# Patient Record
Sex: Female | Born: 1985 | Race: White | Hispanic: No | Smoking: Current every day smoker
Health system: Southern US, Community
[De-identification: ages and names within clinical notes are randomized; demographics above are authoritative.]

## PROBLEM LIST (undated history)

## (undated) DIAGNOSIS — F4310 Post-traumatic stress disorder, unspecified: Secondary | ICD-10-CM

## (undated) DIAGNOSIS — T8859XA Other complications of anesthesia, initial encounter: Secondary | ICD-10-CM

## (undated) DIAGNOSIS — I10 Essential (primary) hypertension: Secondary | ICD-10-CM

## (undated) DIAGNOSIS — B009 Herpesviral infection, unspecified: Secondary | ICD-10-CM

## (undated) DIAGNOSIS — N920 Excessive and frequent menstruation with regular cycle: Secondary | ICD-10-CM

## (undated) DIAGNOSIS — F259 Schizoaffective disorder, unspecified: Secondary | ICD-10-CM

## (undated) DIAGNOSIS — T4145XA Adverse effect of unspecified anesthetic, initial encounter: Secondary | ICD-10-CM

## (undated) DIAGNOSIS — F319 Bipolar disorder, unspecified: Secondary | ICD-10-CM

## (undated) DIAGNOSIS — R87629 Unspecified abnormal cytological findings in specimens from vagina: Secondary | ICD-10-CM

## (undated) DIAGNOSIS — J45909 Unspecified asthma, uncomplicated: Secondary | ICD-10-CM

## (undated) DIAGNOSIS — F419 Anxiety disorder, unspecified: Secondary | ICD-10-CM

## (undated) DIAGNOSIS — G43909 Migraine, unspecified, not intractable, without status migrainosus: Secondary | ICD-10-CM

## (undated) DIAGNOSIS — J449 Chronic obstructive pulmonary disease, unspecified: Secondary | ICD-10-CM

## (undated) DIAGNOSIS — F32A Depression, unspecified: Secondary | ICD-10-CM

## (undated) DIAGNOSIS — F250 Schizoaffective disorder, bipolar type: Secondary | ICD-10-CM

## (undated) DIAGNOSIS — K219 Gastro-esophageal reflux disease without esophagitis: Secondary | ICD-10-CM

## (undated) HISTORY — DX: Migraine, unspecified, not intractable, without status migrainosus: G43.909

## (undated) HISTORY — PX: CARPAL TUNNEL RELEASE: SHX101

## (undated) HISTORY — PX: OTHER SURGICAL HISTORY: SHX169

## (undated) HISTORY — PX: LAPAROSCOPIC GASTRIC SLEEVE RESECTION: SHX5895

## (undated) HISTORY — DX: Herpesviral infection, unspecified: B00.9

## (undated) HISTORY — PX: PANNICULECTOMY: SUR1001

## (undated) HISTORY — DX: Unspecified abnormal cytological findings in specimens from vagina: R87.629

## (undated) HISTORY — DX: Excessive and frequent menstruation with regular cycle: N92.0

## (undated) HISTORY — DX: Unspecified asthma, uncomplicated: J45.909

## (undated) HISTORY — DX: Chronic obstructive pulmonary disease, unspecified: J44.9

## (undated) HISTORY — DX: Post-traumatic stress disorder, unspecified: F43.10

## (undated) HISTORY — PX: ABDOMINAL SURGERY: SHX537

## (undated) HISTORY — PX: TONSILLECTOMY: SUR1361

---
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
## 2007-04-04 DIAGNOSIS — G43909 Migraine, unspecified, not intractable, without status migrainosus: Secondary | ICD-10-CM | POA: Insufficient documentation

## 2007-04-18 DIAGNOSIS — F39 Unspecified mood [affective] disorder: Secondary | ICD-10-CM | POA: Insufficient documentation

## 2011-06-08 ENCOUNTER — Emergency Department: Payer: Self-pay | Admitting: Emergency Medicine

## 2011-06-23 ENCOUNTER — Emergency Department: Payer: Self-pay | Admitting: Emergency Medicine

## 2011-06-23 LAB — CBC
HGB: 12.5 g/dL (ref 12.0–16.0)
MCH: 29.5 pg (ref 26.0–34.0)
MCHC: 33.7 g/dL (ref 32.0–36.0)
MCV: 88 fL (ref 80–100)
Platelet: 335 10*3/uL (ref 150–440)
RBC: 4.24 10*6/uL (ref 3.80–5.20)

## 2011-06-23 LAB — URINALYSIS, COMPLETE
Bacteria: NONE SEEN
Bilirubin,UR: NEGATIVE
Glucose,UR: NEGATIVE mg/dL (ref 0–75)
Ketone: NEGATIVE
Leukocyte Esterase: NEGATIVE
Ph: 6 (ref 4.5–8.0)
Specific Gravity: 1.025 (ref 1.003–1.030)
Squamous Epithelial: NONE SEEN
WBC UR: 8 /HPF (ref 0–5)

## 2011-06-23 LAB — PREGNANCY, URINE: Pregnancy Test, Urine: NEGATIVE m[IU]/mL

## 2011-09-07 ENCOUNTER — Emergency Department: Payer: Self-pay | Admitting: Emergency Medicine

## 2011-09-19 ENCOUNTER — Emergency Department: Payer: Self-pay | Admitting: Emergency Medicine

## 2011-11-08 ENCOUNTER — Emergency Department: Payer: Self-pay | Admitting: *Deleted

## 2011-11-10 ENCOUNTER — Emergency Department: Payer: Self-pay | Admitting: Emergency Medicine

## 2011-11-12 ENCOUNTER — Emergency Department: Payer: Self-pay | Admitting: Emergency Medicine

## 2012-04-17 ENCOUNTER — Emergency Department: Payer: Self-pay | Admitting: Emergency Medicine

## 2012-09-14 ENCOUNTER — Ambulatory Visit: Payer: Self-pay | Admitting: Specialist

## 2012-09-24 ENCOUNTER — Ambulatory Visit: Payer: Self-pay | Admitting: Specialist

## 2014-08-28 LAB — HM PAP SMEAR: HM Pap smear: NEGATIVE

## 2014-12-14 ENCOUNTER — Emergency Department
Admission: EM | Admit: 2014-12-14 | Discharge: 2014-12-14 | Disposition: A | Discharge status: Home / Self Care | Payer: No Typology Code available for payment source | Attending: Emergency Medicine | Admitting: Emergency Medicine

## 2014-12-14 ENCOUNTER — Encounter: Payer: Self-pay | Admitting: *Deleted

## 2014-12-14 DIAGNOSIS — Z720 Tobacco use: Secondary | ICD-10-CM | POA: Insufficient documentation

## 2014-12-14 DIAGNOSIS — Z91040 Latex allergy status: Secondary | ICD-10-CM | POA: Insufficient documentation

## 2014-12-14 DIAGNOSIS — M545 Low back pain, unspecified: Secondary | ICD-10-CM

## 2014-12-14 DIAGNOSIS — Z79899 Other long term (current) drug therapy: Secondary | ICD-10-CM | POA: Diagnosis not present

## 2014-12-14 DIAGNOSIS — I10 Essential (primary) hypertension: Secondary | ICD-10-CM | POA: Insufficient documentation

## 2014-12-14 DIAGNOSIS — Y9389 Activity, other specified: Secondary | ICD-10-CM | POA: Insufficient documentation

## 2014-12-14 DIAGNOSIS — Y998 Other external cause status: Secondary | ICD-10-CM | POA: Diagnosis not present

## 2014-12-14 DIAGNOSIS — Z8659 Personal history of other mental and behavioral disorders: Secondary | ICD-10-CM | POA: Diagnosis not present

## 2014-12-14 DIAGNOSIS — Z880 Allergy status to penicillin: Secondary | ICD-10-CM | POA: Diagnosis not present

## 2014-12-14 DIAGNOSIS — S3992XA Unspecified injury of lower back, initial encounter: Secondary | ICD-10-CM | POA: Insufficient documentation

## 2014-12-14 DIAGNOSIS — Y9289 Other specified places as the place of occurrence of the external cause: Secondary | ICD-10-CM | POA: Insufficient documentation

## 2014-12-14 DIAGNOSIS — X58XXXA Exposure to other specified factors, initial encounter: Secondary | ICD-10-CM | POA: Diagnosis not present

## 2014-12-14 HISTORY — DX: Schizoaffective disorder, bipolar type: F25.0

## 2014-12-14 HISTORY — DX: Schizoaffective disorder, unspecified: F25.9

## 2014-12-14 HISTORY — DX: Essential (primary) hypertension: I10

## 2014-12-14 HISTORY — DX: Bipolar disorder, unspecified: F31.9

## 2014-12-14 HISTORY — DX: Anxiety disorder, unspecified: F41.9

## 2014-12-14 MED ORDER — OXYCODONE-ACETAMINOPHEN 5-325 MG PO TABS: 1.0000 | ORAL_TABLET | ORAL | Status: DC | PRN

## 2014-12-14 MED ORDER — BACLOFEN 10 MG PO TABS: 5.0000 mg | ORAL_TABLET | Freq: Three times a day (TID) | ORAL | Status: DC

## 2014-12-14 MED ORDER — OXYCODONE-ACETAMINOPHEN 5-325 MG PO TABS
ORAL_TABLET | ORAL | Status: AC
  Administered 2014-12-14: 2 via ORAL
  Filled 2014-12-14: qty 2

## 2014-12-14 MED ORDER — OXYCODONE-ACETAMINOPHEN 5-325 MG PO TABS
ORAL_TABLET | ORAL | Status: AC
  Administered 2014-12-14: 1 via ORAL
  Filled 2014-12-14: qty 1

## 2014-12-14 MED ORDER — OXYCODONE-ACETAMINOPHEN 5-325 MG PO TABS
2.0000 | ORAL_TABLET | Freq: Once | ORAL | Status: AC
  Administered 2014-12-14: 2 via ORAL

## 2014-12-14 MED ORDER — BACLOFEN 10 MG PO TABS
5.0000 mg | ORAL_TABLET | ORAL | Status: AC
Start: 2014-12-14 — End: 2014-12-14
  Administered 2014-12-14: 5 mg via ORAL
  Filled 2014-12-14: qty 0.5

## 2014-12-14 MED ORDER — OXYCODONE-ACETAMINOPHEN 5-325 MG PO TABS
1.0000 | ORAL_TABLET | Freq: Once | ORAL | Status: AC
  Administered 2014-12-14: 1 via ORAL

## 2014-12-14 NOTE — ED Notes (Signed)
Pt alert and in NAD at time of d/c to friend. 

## 2014-12-14 NOTE — ED Provider Notes (Signed)
[facility] Emergency Department Provider Note  ____________________________________________  Time seen: Approximately 4:22 AM  I have reviewed the triage vital signs and the nursing notes.   HISTORY  Chief Complaint Back Pain   HPI Michelle Kelley is a 29 y.o. female with a history of psychiatric illness and profound intentional weight loss status post gastric sleevepresents with acute onset of severe lower back pain starting yesterday.  She states that she felt a pop and has been having severe back pain and spasming on the right side of her lower middle back since that time.  She can put a finger right on the spot that hurts.  The pain radiates to her right buttock but does not radiate farther.  She has no numbness or loss of sensation and no weakness in her lower extremities.  She has had no difficulty urinating and no bladder or bowel incontinence.  She states that applying pressure directly to the affected region helps relieve the pain.     Past Medical History  Diagnosis Date  . Anxiety   . Hypertension   . Schizophrenia, schizo-affective   . Manic depression     There are no active problems to display for this patient.   Past Surgical History  Procedure Laterality Date  . Abdominal surgery    . Panniculectomy      Current Outpatient Rx  Name  Route  Sig  Dispense  Refill  . Multiple Vitamin (MULTIVITAMIN) tablet   Oral   Take 1 tablet by mouth daily.         . baclofen (LIORESAL) 10 MG tablet   Oral   Take 0.5 tablets (5 mg total) by mouth 3 (three) times daily.   30 each   0   . oxyCODONE-acetaminophen (ROXICET) 5-325 MG per tablet   Oral   Take 1-2 tablets by mouth every 4 (four) hours as needed for severe pain.   20 tablet   0     Allergies Amitriptyline; Latex; Nsaids; Penicillins; and Sulfa antibiotics  History reviewed. No pertinent family history.  Social History History  Substance Use Topics  . Smoking status:  Current Every Day Smoker    Types: Cigarettes  . Smokeless tobacco: Never Used  . Alcohol Use: No    Review of Systems Constitutional: No fever/chills Eyes: No visual changes. ENT: No sore throat. Cardiovascular: Denies chest pain. Respiratory: Denies shortness of breath. Gastrointestinal: No abdominal pain.  No nausea, no vomiting.  No diarrhea.  No constipation. Genitourinary: Negative for dysuria.  No urinary incontinence or retention Musculoskeletal: Negative for back pain. Skin: Negative for rash. Neurological: Negative for headaches, focal weakness or numbness.  10-point ROS otherwise negative.  ____________________________________________   PHYSICAL EXAM:  VITAL SIGNS: ED Triage Vitals  Enc Vitals Group     BP 12/14/14 0251 161/109 mmHg     Pulse Rate 12/14/14 0251 110     Resp 12/14/14 0251 18     Temp 12/14/14 0251 99.1 F (37.3 C)     Temp Source 12/14/14 0251 Oral     SpO2 12/14/14 0251 99 %     Weight 12/14/14 0251 130 lb (58.968 kg)     Height 12/14/14 0251 5\' 2"  (1.575 m)     Head Cir --      Peak Flow --      Pain Score 12/14/14 0254 8     Pain Loc --      Pain Edu? --  Excl. in GC? --     Constitutional: Alert and oriented. Well appearing and in no acute distress.  Appears uncomfortable Eyes: Conjunctivae are normal. PERRL. EOMI. Head: Atraumatic. Nose: No congestion/rhinnorhea. Mouth/Throat: Mucous membranes are moist.  Oropharynx non-erythematous. Neck: No stridor.   Cardiovascular: Normal rate, regular rhythm. Grossly normal heart sounds.  Good peripheral circulation. Respiratory: Normal respiratory effort.  No retractions. Lungs CTAB. Gastrointestinal: Soft and nontender. No distention. No abdominal bruits. No CVA tenderness. Musculoskeletal: Severe point tenderness and muscle spasms of the paraspinal muscles to the right side of her spinal column approximately at the area of T10.  No bony tenderness to palpation of the vertebral column.   Neurologic:  Normal speech and language. No gross focal neurologic deficits are appreciated. Speech is normal.  Lower extremity reflexes are normal bilaterally. Skin:  Skin is warm, dry and intact. No rash noted. Psychiatric: Mood and affect are normal. Speech and behavior are normal.  ____________________________________________   LABS (all labs ordered are listed, but only abnormal results are displayed)  Labs Reviewed - No data to display ____________________________________________  EKG  Not indicated ____________________________________________  RADIOLOGY  No results found.  ____________________________________________   PROCEDURES  Procedure(s) performed: None  Critical Care performed: No ____________________________________________   INITIAL IMPRESSION / ASSESSMENT AND PLAN / ED COURSE  Pertinent labs & imaging results that were available during my care of the patient were reviewed by me and considered in my medical decision making (see chart for details).  Acute lower thoracic/upper lumbar right-sided back pain.  No evidence of cauda equina.  I looked up the patient and drug database and she has no hits.  I will provide baclofen and a small number of Percocet and advised conservative measures.  I will provide follow-up with orthopedics and I gave her my usual customary return precautions.  ____________________________________________  FINAL CLINICAL IMPRESSION(S) / ED DIAGNOSES  Final diagnoses:  Acute low back pain      NEW MEDICATIONS STARTED DURING THIS VISIT:  New Prescriptions   BACLOFEN (LIORESAL) 10 MG TABLET    Take 0.5 tablets (5 mg total) by mouth 3 (three) times daily.   OXYCODONE-ACETAMINOPHEN (ROXICET) 5-325 MG PER TABLET    Take 1-2 tablets by mouth every 4 (four) hours as needed for severe pain.     Loleta Rose, MD 12/14/14 [phone]

## 2014-12-14 NOTE — Discharge Instructions (Signed)
You have been seen in the Emergency Department (ED)  today for back pain.  Your workup and exam have not shown any acute abnormalities and you are likely suffering from muscle strain or possible problems with your discs, but there is no treatment that will fix your symptoms at this time.    Take Percocet as prescribed. Do not drink alcohol, drive or participate in any other potentially dangerous activities while taking this medication as it may make you sleepy. Do not take this medication with any other sedating medications, either prescription or over-the-counter. If you were prescribed Percocet or Vicodin, do not take these with acetaminophen (Tylenol) as it is already contained within these medications.   This medication is an opiate (or narcotic) pain medication and can be habit forming.  Use it as little as possible to achieve adequate pain control.  Do not use or use it with extreme caution if you have a history of opiate abuse or dependence.  If you are on a pain contract with your primary care doctor or a pain specialist, be sure to let them know you were prescribed this medication today from the [facility] Emergency Department.  This medication is intended for your use only - do not give any to anyone else and keep it in a secure place where nobody else, especially children, have access to it.  It will also cause or worsen constipation, so you may want to consider taking an over-the-counter stool softener while you are taking this medication.  Please follow up with your doctor as soon as possible regarding today's ED visit and your back pain.  Return to the ED for worsening back pain, fever, weakness or numbness of either leg, or if you develop either (1) an inability to urinate or have bowel movements, or (2) loss of your ability to control your bathroom functions (if you start having "accidents"), or if you develop other new symptoms that concern you.   Back Pain, Adult Low back pain is  very common. About 1 in 5 people have back pain.The cause of low back pain is rarely dangerous. The pain often gets better over time.About half of people with a sudden onset of back pain feel better in just 2 weeks. About 8 in 10 people feel better by 6 weeks.  CAUSES Some common causes of back pain include:  Strain of the muscles or ligaments supporting the spine.  Wear and tear (degeneration) of the spinal discs.  Arthritis.  Direct injury to the back. DIAGNOSIS Most of the time, the direct cause of low back pain is not known.However, back pain can be treated effectively even when the exact cause of the pain is unknown.Answering your caregiver's questions about your overall health and symptoms is one of the most accurate ways to make sure the cause of your pain is not dangerous. If your caregiver needs more information, he or she may order lab work or imaging tests (X-rays or MRIs).However, even if imaging tests show changes in your back, this usually does not require surgery. HOME CARE INSTRUCTIONS For many people, back pain returns.Since low back pain is rarely dangerous, it is often a condition that people can learn to [facility] their own.   Remain active. It is stressful on the back to sit or stand in one place. Do not sit, drive, or stand in one place for more than 30 minutes at a time. Take short walks on level surfaces as soon as pain allows.Try to increase the length  of time you walk each day.  Do not stay in bed.Resting more than 1 or 2 days can delay your recovery.  Do not avoid exercise or work.Your body is made to move.It is not dangerous to be active, even though your back may hurt.Your back will likely heal faster if you return to being active before your pain is gone.  Pay attention to your body when you bend and lift. Many people have less discomfortwhen lifting if they bend their knees, keep the load close to their bodies,and avoid twisting. Often, the most  comfortable positions are those that put less stress on your recovering back.  Find a comfortable position to sleep. Use a firm mattress and lie on your side with your knees slightly bent. If you lie on your back, put a pillow under your knees.  Only take over-the-counter or prescription medicines as directed by your caregiver. Over-the-counter medicines to reduce pain and inflammation are often the most helpful.Your caregiver may prescribe muscle relaxant drugs.These medicines help dull your pain so you can more quickly return to your normal activities and healthy exercise.  Put ice on the injured area.  Put ice in a plastic bag.  Place a towel between your skin and the bag.  Leave the ice on for 15-20 minutes, 03-04 times a day for the first 2 to 3 days. After that, ice and heat may be alternated to reduce pain and spasms.  Ask your caregiver about trying back exercises and gentle massage. This may be of some benefit.  Avoid feeling anxious or stressed.Stress increases muscle tension and can worsen back pain.It is important to recognize when you are anxious or stressed and learn ways to manage it.Exercise is a great option. SEEK MEDICAL CARE IF:  You have pain that is not relieved with rest or medicine.  You have pain that does not improve in 1 week.  You have new symptoms.  You are generally not feeling well. SEEK IMMEDIATE MEDICAL CARE IF:   You have pain that radiates from your back into your legs.  You develop new bowel or bladder control problems.  You have unusual weakness or numbness in your arms or legs.  You develop nausea or vomiting.  You develop abdominal pain.  You feel faint. Document Released: 06/06/2005 Document Revised: 12/06/2011 Document Reviewed: 10/08/2013 [facility] Patient Information 2015 [place], [place]. This information is not intended to replace advice given to you by your health care provider. Make sure you discuss any questions you have with  your health care provider.

## 2014-12-14 NOTE — ED Notes (Signed)
Pt states she "popped" her back yesterday, injuring her back. Pt states she felt a "pop" and then her muscles became tight and started having back spasms at work. Pt states her knees felt weak and she was unable to stand. Pt states R side is more painful. Pt marked the points of tenderness with a sharpie. Pt states that as long as she puts pressure on her lower back she is able to tolerate the pain.

## 2015-04-15 LAB — OB RESULTS CONSOLE GC/CHLAMYDIA
CHLAMYDIA, DNA PROBE: NEGATIVE
GC PROBE AMP, GENITAL: NEGATIVE

## 2015-04-22 LAB — OB RESULTS CONSOLE PLATELET COUNT: Platelets: 283 10*3/uL

## 2015-04-22 LAB — OB RESULTS CONSOLE HGB/HCT, BLOOD
HEMATOCRIT: 37 %
Hemoglobin: 12.4 g/dL

## 2015-04-22 LAB — OB RESULTS CONSOLE HEPATITIS B SURFACE ANTIGEN: Hepatitis B Surface Ag: NEGATIVE

## 2015-04-22 LAB — OB RESULTS CONSOLE ABO/RH: RH Type: NEGATIVE

## 2015-04-22 LAB — OB RESULTS CONSOLE RUBELLA ANTIBODY, IGM: RUBELLA: IMMUNE

## 2015-04-22 LAB — OB RESULTS CONSOLE ANTIBODY SCREEN: Antibody Screen: NEGATIVE

## 2015-04-22 LAB — OB RESULTS CONSOLE RPR: RPR: NONREACTIVE

## 2015-04-22 LAB — OB RESULTS CONSOLE HIV ANTIBODY (ROUTINE TESTING): HIV: NONREACTIVE

## 2015-04-22 LAB — OB RESULTS CONSOLE VARICELLA ZOSTER ANTIBODY, IGG: Varicella: IMMUNE

## 2015-09-01 ENCOUNTER — Ambulatory Visit (INDEPENDENT_AMBULATORY_CARE_PROVIDER_SITE_OTHER): Payer: No Typology Code available for payment source | Admitting: Podiatry

## 2015-09-01 ENCOUNTER — Encounter: Payer: Self-pay | Admitting: Podiatry

## 2015-09-01 VITALS — BP 133/73 | HR 95 | Resp 18

## 2015-09-01 DIAGNOSIS — L600 Ingrowing nail: Secondary | ICD-10-CM | POA: Diagnosis not present

## 2015-09-01 NOTE — Progress Notes (Signed)
   Subjective:    Patient ID: Michelle Kelley, female    DOB: 04/17/1986, 30 y.o.   MRN: [id number]  HPI  30 year old female presents the office today for concerns of pain to the left medial big toe nail. She states his revisit have partial nail avulsion performed and since then she has started to have recurrence of the small Pease and nail within the nail and also concerned become ingrown again. She has not noticed any drainage or pus. Denies any surrounding redness or swelling. She's had no recent treatment. No other complaints.  She is going 7 months pregnant.    Review of Systems  All other systems reviewed and are negative.      Objective:   Physical Exam General: AAO x3, NAD  Dermatological: On the medial portion left hallux toenail does appear to be a small spicule of nail within the nail border there is evidence of a previous partial nail avulsion. There is thick hyperkeratotic tissue present in the proximal medial aspect of the nail fold. It is really some incurvation along the remnant of the nail the nail somewhat loose distally. His tenderness overlying the medial portion of the nail on the incurvated portion. There is no drainage or pus. No redness or swelling. No open lesions.  Vascular: Dorsalis Pedis artery and Posterior Tibial artery pedal pulses are 2/4 bilateral with immedate capillary fill time. Pedal hair growth present. No varicosities and no lower extremity edema present bilateral. There is no pain with calf compression, swelling, warmth, erythema.   Neruologic: Grossly intact via light touch bilateral. Vibratory intact via tuning fork bilateral. Protective threshold with Semmes Wienstein monofilament intact to all pedal sites bilateral. Patellar and Achilles deep tendon reflexes 2+ bilateral. No Babinski or clonus noted bilateral.   Musculoskeletal: No gross boney pedal deformities bilateral. No pain, crepitus, or limitation noted with foot and ankle range of motion  bilateral. Muscular strength 5/5 in all groups tested bilateral.  Gait: Unassisted, Nonantalgic.      Assessment & Plan:  30 year old female with left hallux recurrence ingrown toenail, no infection -Treatment options discussed including all alternatives, risks, and complications -Etiology of symptoms were discussed -At this time I discussed with her removal of the ingrown portion of the nail as well as to clean up the nail border from the previous nail avulsion. She did proceed with this. Because she is pregnant did not use female. Under sterile conditions a total of 3 mL of a lidocaine plain was infiltrated in a hallux block fashion. Once anesthetized the skin was prepped in sterile fashion. Next a curette was utilized to remove the spicule of nail in the medial nail border as well as a debridement of hyperkeratotic tissue within nail border. Also the medial aspect of the remaining nail was excised to remove the ingrown portion. There is no purulence expressed. No clinical signs of infection. The area was irrigated with saline and hemostasis was achieved. Betadine ointment was applied followed by a dressing. The patient had the procedure well any complications. Post procedure instructions were discussed. -Follow-up in 1 week or sooner if any problems arise. In the meantime, encouraged to call the office with any questions, concerns, change in symptoms.   Ovid CurdMatthew Zahmir Lalla, DPM

## 2015-09-01 NOTE — Patient Instructions (Signed)

## 2015-09-08 ENCOUNTER — Encounter: Payer: Self-pay | Admitting: Podiatry

## 2015-09-08 ENCOUNTER — Ambulatory Visit (INDEPENDENT_AMBULATORY_CARE_PROVIDER_SITE_OTHER): Payer: No Typology Code available for payment source | Admitting: Podiatry

## 2015-09-08 DIAGNOSIS — L600 Ingrowing nail: Secondary | ICD-10-CM

## 2015-09-08 DIAGNOSIS — Z9889 Other specified postprocedural states: Secondary | ICD-10-CM

## 2015-09-08 NOTE — Patient Instructions (Signed)

## 2015-09-08 NOTE — Progress Notes (Signed)
Patient ID: Michelle Kelley, female   DOB: 03/10/1986, [age] y.o.   MRN: [id number]  Subjective: Michelle Kelley is a 30 y.o.  female returns to office today for follow up evaluation after having left medial Hallux partial temporary nail avulsion performed. Patient has been soaking using epsom salts and applying topical antibiotic covered with bandaid daily. Denies any surrounding redness or drainage or pus. She states it feels better than before surgery and the discomfort she is having is from the procedure site itself. This is improving. Patient denies fevers, chills, nausea, vomiting. Denies any calf pain, chest pain, SOB.   Objective:  Vitals: Reviewed  General: Well developed, nourished, in no acute distress, alert and oriented x3   Dermatology: Skin is warm, dry and supple bilateral. Medial hallux nail border appears to be clean, dry, with mild granular tissue and surrounding scab. There is no surrounding erythema, edema, drainage/purulence. The remaining nails appear unremarkable at this time. There are no other lesions or other signs of infection present.  Neurovascular status: Intact. No lower extremity swelling; No pain with calf compression bilateral.  Musculoskeletal: Decreased tenderness to palpation of the medial hallux nail fold. Muscular strength within normal limits bilateral.   Assesement and Plan: S/p partial nail avulsion, doing well.   -Continue soaking in epsom salts twice a day followed by antibiotic ointment and a band-aid. Can leave uncovered at night. Continue this until completely healed.  -If the area has not healed in 2 weeks, call the office for follow-up appointment, or sooner if any problems arise.  -Monitor for any signs/symptoms of infection. Call the office immediately if any occur or go directly to the emergency room. Call with any questions/concerns.  Ovid CurdMatthew Wagoner, DPM

## 2015-10-06 ENCOUNTER — Ambulatory Visit (INDEPENDENT_AMBULATORY_CARE_PROVIDER_SITE_OTHER): Payer: No Typology Code available for payment source | Admitting: Obstetrics and Gynecology

## 2015-10-06 ENCOUNTER — Encounter: Payer: Self-pay | Admitting: Obstetrics and Gynecology

## 2015-10-06 VITALS — BP 121/83 | HR 94 | Wt 176.1 lb

## 2015-10-06 DIAGNOSIS — Z3493 Encounter for supervision of normal pregnancy, unspecified, third trimester: Secondary | ICD-10-CM

## 2015-10-06 DIAGNOSIS — F203 Undifferentiated schizophrenia: Secondary | ICD-10-CM

## 2015-10-06 DIAGNOSIS — E669 Obesity, unspecified: Secondary | ICD-10-CM

## 2015-10-06 DIAGNOSIS — Z720 Tobacco use: Secondary | ICD-10-CM

## 2015-10-06 DIAGNOSIS — Z98891 History of uterine scar from previous surgery: Secondary | ICD-10-CM

## 2015-10-06 DIAGNOSIS — O09293 Supervision of pregnancy with other poor reproductive or obstetric history, third trimester: Secondary | ICD-10-CM

## 2015-10-06 DIAGNOSIS — F3170 Bipolar disorder, currently in remission, most recent episode unspecified: Secondary | ICD-10-CM

## 2015-10-06 DIAGNOSIS — F1990 Other psychoactive substance use, unspecified, uncomplicated: Secondary | ICD-10-CM

## 2015-10-06 LAB — POCT URINALYSIS DIPSTICK
Bilirubin, UA: 1
GLUCOSE UA: NEGATIVE
Ketones, UA: NEGATIVE
NITRITE UA: NEGATIVE
RBC UA: NEGATIVE
Spec Grav, UA: 1.02
Urobilinogen, UA: 0.2
pH, UA: 6.5

## 2015-10-06 NOTE — Progress Notes (Signed)
NOB transfer from [facility] 32 weeks- no GTT- checked BS all normal- utd on tdap- RH- neg- had rhogam. Did not sign MRR.   OB TRANSFER: The patient is a 30 year old single engaged white female gravida 3 para 06/21/1998, EGA 32.[redacted] weeks gestation, [facility], [facility], presents for transfer of care. Past OB history: G1-severe preeclampsia; induction of labor and [redacted] weeks gestation [facility]; patient was told infant died, but believes otherwise based on information given to her by other family members and friends. G2-severe preeclampsia; delivered at [redacted] weeks gestation via primary low cervical transverse cesarean section; patient weighs 350 pounds at that delivery, and subsequently lost 220 pounds following vertical sleeve gastrectomy surgery and panniculectomy in 2014; baby was given up for adoption. G3-current pregnancy  Patient has had different father of baby for each pregnancy. Father of baby current pregnancy is supportive. No genetic testing done this pregnancy; family history is unknown as both mother and father were adopted. Denies alcohol use  Significant prenatal risk factors: 1. History of severe preeclampsia in first 2 pregnancies; unable to take aspirin therapy because of gastric sleeve surgery 2. History of prior cesarean section delivery; scheduled for repeat 3. History of schizophrenia; previously on Geodon; not currently on medication 4. History of manic depressive disorder; not on medications; does not desire psychiatry care;" just goes to hospital when admission is needed" 5. Tobacco user; tobacco day smoker with decrease in cigarette consumption to 5 cigarettes a day currently 6. 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 Increased BMI 8. 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 High-risk pregnancy at 32.[redacted] weeks gestation 2. Prior C-section, requiring repeat 3. History of severe preeclampsia with first 2 pregnancies without evidence of elevated blood pressures in current pregnancy 4. Tobacco user 5. Substance use history, currently using marijuana 6. 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 Begin antepartum testing with twice weekly NSTs 2. Growth ultrasound-1 week 3. [facility] and [facility] for repeat [facility] 4. Patient was counseled regarding tobacco use in pregnancy and is encouraged to quit 5. Patient was counseled regarding marijuana use in pregnancy, implications on neurologic fetal development antepartum as well as impact on development postpartum and breast-feeding women using marijuana. Patient is encouraged not to breast-feed.  Herold HarmsMartin A Defrancesco, MD

## 2015-10-07 ENCOUNTER — Telehealth: Payer: Self-pay | Admitting: *Deleted

## 2015-10-07 DIAGNOSIS — F1990 Other psychoactive substance use, unspecified, uncomplicated: Secondary | ICD-10-CM | POA: Insufficient documentation

## 2015-10-07 DIAGNOSIS — O09299 Supervision of pregnancy with other poor reproductive or obstetric history, unspecified trimester: Secondary | ICD-10-CM | POA: Insufficient documentation

## 2015-10-07 DIAGNOSIS — F319 Bipolar disorder, unspecified: Secondary | ICD-10-CM | POA: Insufficient documentation

## 2015-10-07 DIAGNOSIS — Z720 Tobacco use: Secondary | ICD-10-CM | POA: Insufficient documentation

## 2015-10-07 DIAGNOSIS — E669 Obesity, unspecified: Secondary | ICD-10-CM | POA: Insufficient documentation

## 2015-10-07 DIAGNOSIS — Z98891 History of uterine scar from previous surgery: Secondary | ICD-10-CM | POA: Insufficient documentation

## 2015-10-07 DIAGNOSIS — O99210 Obesity complicating pregnancy, unspecified trimester: Secondary | ICD-10-CM | POA: Insufficient documentation

## 2015-10-07 DIAGNOSIS — F209 Schizophrenia, unspecified: Secondary | ICD-10-CM | POA: Insufficient documentation

## 2015-10-07 NOTE — Telephone Encounter (Signed)
Patient called and stated that she received a call this morning from the office.  Patient stated that a message wasn't left for her. Patient is requesting a call back . Call back number [phone]. Thanks

## 2015-10-08 ENCOUNTER — Telehealth: Payer: Self-pay | Admitting: *Deleted

## 2015-10-08 NOTE — Telephone Encounter (Signed)
Pt is coming in 10/09/15

## 2015-10-08 NOTE — Telephone Encounter (Signed)
Pt aware I did not call her. Not sure who is? Pt states she has a migraine. Advised tylenol es. Stay hydrated. Cool compress.

## 2015-10-08 NOTE — Telephone Encounter (Signed)
Patient called again stating someone called her.  I made patient aware that it could be the reminder system reminding her of her appt.  Patient cell phone is not set up with Voicemail. Patient is requesting a call back. Patient states to call back after 12 noon. Thanks

## 2015-10-09 ENCOUNTER — Encounter: Payer: No Typology Code available for payment source | Admitting: Obstetrics and Gynecology

## 2015-10-09 ENCOUNTER — Other Ambulatory Visit: Payer: Self-pay | Admitting: Obstetrics and Gynecology

## 2015-10-09 ENCOUNTER — Ambulatory Visit (INDEPENDENT_AMBULATORY_CARE_PROVIDER_SITE_OTHER): Payer: No Typology Code available for payment source | Admitting: Obstetrics and Gynecology

## 2015-10-09 ENCOUNTER — Other Ambulatory Visit (INDEPENDENT_AMBULATORY_CARE_PROVIDER_SITE_OTHER): Payer: No Typology Code available for payment source

## 2015-10-09 VITALS — BP 125/61 | HR 102 | Wt 176.5 lb

## 2015-10-09 DIAGNOSIS — O365993 Maternal care for other known or suspected poor fetal growth, unspecified trimester, fetus 3: Secondary | ICD-10-CM

## 2015-10-09 DIAGNOSIS — Z369 Encounter for antenatal screening, unspecified: Secondary | ICD-10-CM

## 2015-10-09 DIAGNOSIS — Z3493 Encounter for supervision of normal pregnancy, unspecified, third trimester: Secondary | ICD-10-CM

## 2015-10-09 DIAGNOSIS — Z1389 Encounter for screening for other disorder: Secondary | ICD-10-CM

## 2015-10-09 DIAGNOSIS — F1910 Other psychoactive substance abuse, uncomplicated: Secondary | ICD-10-CM

## 2015-10-09 DIAGNOSIS — Z3490 Encounter for supervision of normal pregnancy, unspecified, unspecified trimester: Secondary | ICD-10-CM

## 2015-10-09 DIAGNOSIS — Z36 Encounter for antenatal screening of mother: Secondary | ICD-10-CM

## 2015-10-09 LAB — POCT URINALYSIS DIPSTICK
Bilirubin, UA: NEGATIVE
Glucose, UA: NEGATIVE
KETONES UA: NEGATIVE
Leukocytes, UA: NEGATIVE
Nitrite, UA: NEGATIVE
PH UA: 6
Protein, UA: NEGATIVE
RBC UA: NEGATIVE
Spec Grav, UA: 1.01
UROBILINOGEN UA: NEGATIVE

## 2015-10-09 NOTE — Progress Notes (Signed)
   NONSTRESS TEST INTERPRETATION  INDICATIONS: possible aymmetric IUGR; substance abuse, hx severe preeclampsia  FHR baseline:  150 RESULTS: reactive, reassuring, pt sent to ultrasound per [facility] COMMENTS: drug screen ordered per [facility]   PLAN: 1. Continue fetal kick counts twice a day. 2. Continue antepartum testing as scheduled-Biweekly   Fenton Mallingebbie Jossalin Chervenak, LPN

## 2015-10-10 LAB — PAIN MGT SCRN (14 DRUGS), UR
Amphetamine Screen, Ur: NEGATIVE ng/mL
BARBITURATE SCRN UR: NEGATIVE ng/mL
Benzodiazepine Screen, Urine: NEGATIVE ng/mL
Buprenorphine, Urine: NEGATIVE ng/mL
COCAINE(METAB.) SCREEN, URINE: NEGATIVE ng/mL
CREATININE(CRT), U: 68 mg/dL (ref 20.0–300.0)
Cannabinoids Ur Ql Scn: POSITIVE ng/mL
Fentanyl, Urine: NEGATIVE pg/mL
MEPERIDINE SCREEN, URINE: NEGATIVE ng/mL
Methadone Scn, Ur: NEGATIVE ng/mL
OPIATE SCRN UR: NEGATIVE ng/mL
Oxycodone+Oxymorphone Ur Ql Scn: NEGATIVE ng/mL
PCP SCRN UR: NEGATIVE ng/mL
PROPOXYPHENE SCREEN: NEGATIVE ng/mL
Ph of Urine: 6.4 (ref 4.5–8.9)
TRAMADOL UR QL SCN: NEGATIVE ng/mL

## 2015-10-10 LAB — NICOTINE SCREEN, URINE: COTININE UR QL SCN: POSITIVE ng/mL

## 2015-10-12 ENCOUNTER — Telehealth: Payer: Self-pay | Admitting: Obstetrics and Gynecology

## 2015-10-12 NOTE — Telephone Encounter (Signed)
Pt was called and was informed that we normally go by the EDD resulted by US which was 12/01/15. This is what is indicated by the LMP (02/24/2015) that was initially given. Now said her LMP date was 02/18/2015 which is a 6 day difference (11/25/2015). Could not get pt to understand this. She said the reason she left the other place is because they were giving her different answers (like 3 different due dates). We do not have those records yet and pt states "well, you were supposed to get them". When told she needed a release she states she already signed medical release, I said okay then they must have not sent them yet.  Pt became very irate and I could no longer communicate with her. She said she was going to go (hang up) and I asked to wait a moment but just kept talking, hung the phone up.  She is to have NST's 2x weekly and none were scheduled from her last appt. And I wanted to get those scheduled. The front desk tried to contact pt and myself again and there was a message in spanish and there was no way to leave her a message.

## 2015-10-12 NOTE — Telephone Encounter (Signed)
SHE WANTED TO TELL YOU HER lmp WAS 02/18/2015. SHE SAID HER DUE DATE HAS BEEN CHANGED 4 TIMES AND WANTED TO KNOW IF THIS WOULD CHANGED IT AGAIN?

## 2015-10-13 ENCOUNTER — Other Ambulatory Visit: Payer: Self-pay | Admitting: Obstetrics and Gynecology

## 2015-10-13 DIAGNOSIS — F1210 Cannabis abuse, uncomplicated: Secondary | ICD-10-CM | POA: Insufficient documentation

## 2015-10-14 ENCOUNTER — Other Ambulatory Visit: Payer: No Typology Code available for payment source

## 2015-10-14 NOTE — Telephone Encounter (Signed)
Can we contact [facility] again for this patient's records? She is supposed to be seen in our office today.

## 2015-10-15 ENCOUNTER — Encounter: Payer: No Typology Code available for payment source | Admitting: Obstetrics and Gynecology

## 2015-11-21 ENCOUNTER — Emergency Department
Admission: EM | Admit: 2015-11-21 | Discharge: 2015-11-21 | Disposition: A | Discharge status: Home / Self Care | Payer: No Typology Code available for payment source | Attending: Emergency Medicine | Admitting: Emergency Medicine

## 2015-11-21 ENCOUNTER — Emergency Department: Payer: No Typology Code available for payment source

## 2015-11-21 ENCOUNTER — Encounter: Payer: Self-pay | Admitting: Emergency Medicine

## 2015-11-21 DIAGNOSIS — S93402A Sprain of unspecified ligament of left ankle, initial encounter: Secondary | ICD-10-CM | POA: Insufficient documentation

## 2015-11-21 DIAGNOSIS — Y999 Unspecified external cause status: Secondary | ICD-10-CM | POA: Diagnosis not present

## 2015-11-21 DIAGNOSIS — X501XXA Overexertion from prolonged static or awkward postures, initial encounter: Secondary | ICD-10-CM | POA: Diagnosis not present

## 2015-11-21 DIAGNOSIS — J45909 Unspecified asthma, uncomplicated: Secondary | ICD-10-CM | POA: Diagnosis not present

## 2015-11-21 DIAGNOSIS — I10 Essential (primary) hypertension: Secondary | ICD-10-CM | POA: Diagnosis not present

## 2015-11-21 DIAGNOSIS — Y939 Activity, unspecified: Secondary | ICD-10-CM | POA: Insufficient documentation

## 2015-11-21 DIAGNOSIS — Y929 Unspecified place or not applicable: Secondary | ICD-10-CM | POA: Insufficient documentation

## 2015-11-21 DIAGNOSIS — S99912A Unspecified injury of left ankle, initial encounter: Secondary | ICD-10-CM | POA: Diagnosis present

## 2015-11-21 DIAGNOSIS — F1290 Cannabis use, unspecified, uncomplicated: Secondary | ICD-10-CM | POA: Insufficient documentation

## 2015-11-21 DIAGNOSIS — O9A213 Injury, poisoning and certain other consequences of external causes complicating pregnancy, third trimester: Secondary | ICD-10-CM | POA: Diagnosis not present

## 2015-11-21 DIAGNOSIS — Z3A39 39 weeks gestation of pregnancy: Secondary | ICD-10-CM | POA: Insufficient documentation

## 2015-11-21 DIAGNOSIS — F250 Schizoaffective disorder, bipolar type: Secondary | ICD-10-CM | POA: Diagnosis not present

## 2015-11-21 DIAGNOSIS — F17210 Nicotine dependence, cigarettes, uncomplicated: Secondary | ICD-10-CM | POA: Insufficient documentation

## 2015-11-21 NOTE — ED Provider Notes (Signed)
[facility] Emergency Department Provider Note  ____________________________________________  Time seen: Approximately 4:21 PM  I have reviewed the triage vital signs and the nursing notes.   HISTORY  Chief Complaint Foot Pain    HPI Michelle Kelley is a 30 y.o. female who twisted her ankle yesterday morning hearing a pop. She was initially able to walk on it, but towards the end of the day became more painful and now is unable to bear weight. She is [redacted] weeks pregnant. She is allergic to NSAIDs. She is anticipating C-section in about 5 days. Otherwise she is doing well.   Past Medical History  Diagnosis Date  . Anxiety   . Hypertension   . Schizophrenia, schizo-affective (HCC)   . Manic depression (HCC)   . Vaginal Pap smear, abnormal   . Asthma   . HSV-2 (herpes simplex virus 2) infection   . Menorrhagia   . PTSD (post-traumatic stress disorder)     Patient Active Problem List   Diagnosis Date Noted  . Marijuana abuse 10/13/2015  . Hx of preeclampsia, prior pregnancy, currently pregnant 10/07/2015  . Schizophrenia (HCC) 10/07/2015  . Manic depressive disorder (HCC) 10/07/2015  . History of C-section 10/07/2015  . Obesity 10/07/2015  . Tobacco user 10/07/2015  . Substance use disorder 10/07/2015  . Ingrown toenail 09/01/2015  . Episodic mood disorder (HCC) 04/18/2007  . Headache, migraine 04/04/2007    Past Surgical History  Procedure Laterality Date  . Abdominal surgery    . Panniculectomy    . Cesarean section    . Tonsillectomy    . Laparoscopic gastric sleeve resection      Current Outpatient Rx  Name  Route  Sig  Dispense  Refill  . Multiple Vitamin (MULTIVITAMIN) tablet   Oral   Take 1 tablet by mouth daily.           Allergies Petroleum distillate; Amitriptyline; Latex; Nsaids; Penicillins; and Sulfa antibiotics  Family History  Problem Relation Age of Onset  . Hypertension Mother   . Mental illness Sister    SCHIZOPHRENIA    Social History Social History  Substance Use Topics  . Smoking status: Current Every Day Smoker    Types: Cigarettes  . Smokeless tobacco: Never Used  . Alcohol Use: No    Review of Systems Constitutional: No fever/chills Eyes: No visual changes. ENT: No sore throat. Cardiovascular: Denies chest pain. Respiratory: Denies shortness of breath. Musculoskeletal: per HPI Skin: Negative for rash. Neurological: Negative for headaches, focal weakness or numbness. 10-point ROS otherwise negative.  ____________________________________________   PHYSICAL EXAM:  VITAL SIGNS: ED Triage Vitals  Enc Vitals Group     BP 11/21/15 1532 142/83 mmHg     Pulse Rate 11/21/15 1532 99     Resp 11/21/15 1532 18     Temp 11/21/15 1532 98.6 F (37 C)     Temp Source 11/21/15 1532 Oral     SpO2 11/21/15 1532 98 %     Weight 11/21/15 1532 175 lb (79.379 kg)     Height 11/21/15 1532 5\' 2"  (1.575 m)     Head Cir --      Peak Flow --      Pain Score 11/21/15 1533 5     Pain Loc --      Pain Edu? --      Excl. in GC? --     Constitutional: Alert and oriented. Well appearing and in no acute distress. Eyes: Conjunctivae are normal. PERRL. EOMI.  Ears:  Clear with normal landmarks. No erythema. Head: Atraumatic. Nose: No congestion/rhinnorhea. Mouth/Throat: Mucous membranes are moist.   Musculoskeletal: Nml ROM of upper Extremities. Left ankle. Swelling present with tenderness along the lateral malleoli and connecting ligaments. Tender over the dorsal foot. 2+ DP pulse. Mild tenderness to the medial malleoli. Pain with Lachman's test. Minimal flexion, extension. Neurologic:  Normal speech and language. No gross focal neurologic deficits are appreciated. No gait instability. Skin:  Skin is warm, dry and intact. No rash noted. Psychiatric: Mood and affect are normal. Speech and behavior are normal.  ____________________________________________   LABS (all labs ordered are  listed, but only abnormal results are displayed)  Labs Reviewed - No data to display ____________________________________________  EKG   ____________________________________________  RADIOLOGY  CLINICAL DATA: Larey Seat off porch last night. Pain, swelling.  EXAM: LEFT ANKLE COMPLETE - 3+ VIEW  COMPARISON: None.  FINDINGS: No acute bony abnormality. Specifically, no fracture, subluxation, or dislocation. Soft tissues are intact.  IMPRESSION: No acute bony abnormality.   Electronically Signed  By: Charlett Nose M.D.  On: 11/21/2015 16:01  ____________________________________________   PROCEDURES  Procedure(s) performed: None  Critical Care performed: No  ____________________________________________   INITIAL IMPRESSION / ASSESSMENT AND PLAN / ED COURSE  Pertinent labs & imaging results that were available during my care of the patient were reviewed by me and considered in my medical decision making (see chart for details).  30 year old with left ankle sprain. Stable x-ray. She is certainly [redacted] weeks pregnant. Placed in posterior ankle splint. She has crutches at home. Can follow-up with orthopedics. She declines any pain medicine. ____________________________________________   FINAL CLINICAL IMPRESSION(S) / ED DIAGNOSES  Final diagnoses:  Ankle sprain, left, initial encounter      Ignacia Bayley, PA-C 11/21/15 1641  Jeanmarie Plant, MD 11/21/15 [phone]

## 2015-11-21 NOTE — Discharge Instructions (Signed)
Acute Ankle Sprain With Phase I Rehab An acute ankle sprain is a partial or complete tear in one or more of the ligaments of the ankle due to traumatic injury. The severity of the injury depends on both the number of ligaments sprained and the grade of sprain. There are 3 grades of sprains.   A grade 1 sprain is a mild sprain. There is a slight pull without obvious tearing. There is no loss of strength, and the muscle and ligament are the correct length.  A grade 2 sprain is a moderate sprain. There is tearing of fibers within the substance of the ligament where it connects two bones or two cartilages. The length of the ligament is increased, and there is usually decreased strength.  A grade 3 sprain is a complete rupture of the ligament and is uncommon. In addition to the grade of sprain, there are three types of ankle sprains.  Lateral ankle sprains: This is a sprain of one or more of the three ligaments on the outer side (lateral) of the ankle. These are the most common sprains. Medial ankle sprains: There is one large triangular ligament of the inner side (medial) of the ankle that is susceptible to injury. Medial ankle sprains are less common. Syndesmosis, "high ankle," sprains: The syndesmosis is the ligament that connects the two bones of the lower leg. Syndesmosis sprains usually only occur with very severe ankle sprains. SYMPTOMS  Pain, tenderness, and swelling in the ankle, starting at the side of injury that may progress to the whole ankle and foot with time.  "Pop" or tearing sensation at the time of injury.  Bruising that may spread to the heel.  Impaired ability to walk soon after injury. CAUSES   Acute ankle sprains are caused by trauma placed on the ankle that temporarily forces or pries the anklebone (talus) out of its normal socket.  Stretching or tearing of the ligaments that normally hold the joint in place (usually due to a twisting injury). RISK INCREASES  WITH:  Previous ankle sprain.  Sports in which the foot may land awkwardly (i.e., basketball, volleyball, or soccer) or walking or running on uneven or rough surfaces.  Shoes with inadequate support to prevent sideways motion when stress occurs.  Poor strength and flexibility.  Poor balance skills.  Contact sports. PREVENTION   Warm up and stretch properly before activity.  Maintain physical fitness:  Ankle and leg flexibility, muscle strength, and endurance.  Cardiovascular fitness.  Balance training activities.  Use proper technique and have a coach correct improper technique.  Taping, protective strapping, bracing, or high-top tennis shoes may help prevent injury. Initially, tape is best; however, it loses most of its support function within 10 to 15 minutes.  Wear proper-fitted protective shoes (High-top shoes with taping or bracing is more effective than either alone).  Provide the ankle with support during sports and practice activities for 12 months following injury. PROGNOSIS   If treated properly, ankle sprains can be expected to recover completely; however, the length of recovery depends on the degree of injury.  A grade 1 sprain usually heals enough in 5 to 7 days to allow modified activity and requires an average of 6 weeks to heal completely.  A grade 2 sprain requires 6 to 10 weeks to heal completely.  A grade 3 sprain requires 12 to 16 weeks to heal.  A syndesmosis sprain often takes more than 3 months to heal. RELATED COMPLICATIONS   Frequent recurrence of symptoms may   result in a chronic problem. Appropriately addressing the problem the first time decreases the frequency of recurrence and optimizes healing time. Severity of the initial sprain does not predict the likelihood of later instability. °· Injury to other structures (bone, cartilage, or tendon). °· A chronically unstable or arthritic ankle joint is a possibility with repeated  sprains. °TREATMENT °Treatment initially involves the use of ice, medication, and compression bandages to help reduce pain and inflammation. Ankle sprains are usually immobilized in a walking cast or boot to allow for healing. Crutches may be recommended to reduce pressure on the injury. After immobilization, strengthening and stretching exercises may be necessary to regain strength and a full range of motion. Surgery is rarely needed to treat ankle sprains. °MEDICATION  °· Nonsteroidal anti-inflammatory medications, such as aspirin and ibuprofen (do not take for the first 3 days after injury or within 7 days before surgery), or other minor pain relievers, such as acetaminophen, are often recommended. Take these as directed by your caregiver. Contact your caregiver immediately if any bleeding, stomach upset, or signs of an allergic reaction occur from these medications. °· Ointments applied to the skin may be helpful. °· Pain relievers may be prescribed as necessary by your caregiver. Do not take prescription pain medication for longer than 4 to 7 days. Use only as directed and only as much as you need. °HEAT AND COLD °· Cold treatment (icing) is used to relieve pain and reduce inflammation for acute and chronic cases. Cold should be applied for 10 to 15 minutes every 2 to 3 hours for inflammation and pain and immediately after any activity that aggravates your symptoms. Use ice packs or an ice massage. °· Heat treatment may be used before performing stretching and strengthening activities prescribed by your caregiver. Use a heat pack or a warm soak. °SEEK IMMEDIATE MEDICAL CARE IF:  °· Pain, swelling, or bruising worsens despite treatment. °· You experience pain, numbness, discoloration, or coldness in the foot or toes. °· New, unexplained symptoms develop (drugs used in treatment may produce side effects.) °EXERCISES  °PHASE I EXERCISES °RANGE OF MOTION (ROM) AND STRETCHING EXERCISES - Ankle Sprain, Acute Phase I,  Weeks 1 to 2 °These exercises may help you when beginning to restore flexibility in your ankle. You will likely work on these exercises for the 1 to 2 weeks after your injury. Once your physician, physical therapist, or athletic trainer sees adequate progress, he or she will advance your exercises. While completing these exercises, remember:  °· Restoring tissue flexibility helps normal motion to return to the joints. This allows healthier, less painful movement and activity. °· An effective stretch should be held for at least 30 seconds. °· A stretch should never be painful. You should only feel a gentle lengthening or release in the stretched tissue. °RANGE OF MOTION - Dorsi/Plantar Flexion °· While sitting with your right / left knee straight, draw the top of your foot upwards by flexing your ankle. Then reverse the motion, pointing your toes downward. °· Hold each position for __________ seconds. °· After completing your first set of exercises, repeat this exercise with your knee bent. °Repeat __________ times. Complete this exercise __________ times per day.  °RANGE OF MOTION - Ankle Alphabet °· Imagine your right / left big toe is a pen. °· Keeping your hip and knee still, write out the entire alphabet with your "pen." Make the letters as large as you can without increasing any discomfort. °Repeat __________ times. Complete this exercise __________   times per day.  °STRENGTHENING EXERCISES - Ankle Sprain, Acute -Phase I, Weeks 1 to 2 °These exercises may help you when beginning to restore strength in your ankle. You will likely work on these exercises for 1 to 2 weeks after your injury. Once your physician, physical therapist, or athletic trainer sees adequate progress, he or she will advance your exercises. While completing these exercises, remember:  °· Muscles can gain both the endurance and the strength needed for everyday activities through controlled exercises. °· Complete these exercises as instructed by  your physician, physical therapist, or athletic trainer. Progress the resistance and repetitions only as guided. °· You may experience muscle soreness or fatigue, but the pain or discomfort you are trying to eliminate should never worsen during these exercises. If this pain does worsen, stop and make certain you are following the directions exactly. If the pain is still present after adjustments, discontinue the exercise until you can discuss the trouble with your clinician. °STRENGTH - Dorsiflexors °· Secure a rubber exercise band/tubing to a fixed object (i.e., table, pole) and loop the other end around your right / left foot. °· Sit on the floor facing the fixed object. The band/tubing should be slightly tense when your foot is relaxed. °· Slowly draw your foot back toward you using your ankle and toes. °· Hold this position for __________ seconds. Slowly release the tension in the band and return your foot to the starting position. °Repeat __________ times. Complete this exercise __________ times per day.  °STRENGTH - Plantar-flexors  °· Sit with your right / left leg extended. Holding onto both ends of a rubber exercise band/tubing, loop it around the ball of your foot. Keep a slight tension in the band. °· Slowly push your toes away from you, pointing them downward. °· Hold this position for __________ seconds. Return slowly, controlling the tension in the band/tubing. °Repeat __________ times. Complete this exercise __________ times per day.  °STRENGTH - Ankle Eversion °· Secure one end of a rubber exercise band/tubing to a fixed object (table, pole). Loop the other end around your foot just before your toes. °· Place your fists between your knees. This will focus your strengthening at your ankle. °· Drawing the band/tubing across your opposite foot, slowly, pull your little toe out and up. Make sure the band/tubing is positioned to resist the entire motion. °· Hold this position for __________ seconds. °Have  your muscles resist the band/tubing as it slowly pulls your foot back to the starting position.  °Repeat __________ times. Complete this exercise __________ times per day.  °STRENGTH - Ankle Inversion °· Secure one end of a rubber exercise band/tubing to a fixed object (table, pole). Loop the other end around your foot just before your toes. °· Place your fists between your knees. This will focus your strengthening at your ankle. °· Slowly, pull your big toe up and in, making sure the band/tubing is positioned to resist the entire motion. °· Hold this position for __________ seconds. °· Have your muscles resist the band/tubing as it slowly pulls your foot back to the starting position. °Repeat __________ times. Complete this exercises __________ times per day.  °STRENGTH - Towel Curls °· Sit in a chair positioned on a non-carpeted surface. °· Place your right / left foot on a towel, keeping your heel on the floor. °· Pull the towel toward your heel by only curling your toes. Keep your heel on the floor. °· If instructed by your physician, physical therapist,   or athletic trainer, add weight to the end of the towel. Repeat __________ times. Complete this exercise __________ times per day.   This information is not intended to replace advice given to you by your health care provider. Make sure you discuss any questions you have with your health care provider.   Document Released: 01/05/2005 Document Revised: 06/27/2014 Document Reviewed: 09/18/2008 Elsevier Interactive Patient Education 2016 [employer]   Continue ice to the area. Keep elevated as you're able. Follow-up with the orthopedist if not improving.

## 2015-11-21 NOTE — ED Notes (Signed)
Patient presents to the ED with painful left ankle after falling off her porch yesterday morning.  Patient is [redacted] weeks pregnant.  Patient states the fall was absorbed her her left knee and ankle.  Patient states, "I didn't let my belly fall to the ground."  Patient states fetal movement is normal.  Patient is in no obvious distress at this time.

## 2015-11-25 ENCOUNTER — Encounter
Admission: RE | Admit: 2015-11-25 | Discharge: 2015-11-25 | Disposition: A | Discharge status: Home / Self Care | Payer: Medicaid Other | Source: Ambulatory Visit | Attending: Obstetrics & Gynecology | Admitting: Obstetrics & Gynecology

## 2015-11-25 DIAGNOSIS — Z01812 Encounter for preprocedural laboratory examination: Secondary | ICD-10-CM | POA: Insufficient documentation

## 2015-11-25 HISTORY — DX: Adverse effect of unspecified anesthetic, initial encounter: T41.45XA

## 2015-11-25 HISTORY — DX: Other complications of anesthesia, initial encounter: T88.59XA

## 2015-11-25 LAB — CBC
HCT: 32.2 % — ABNORMAL LOW (ref 35.0–47.0)
HEMOGLOBIN: 10.8 g/dL — AB (ref 12.0–16.0)
MCH: 28.7 pg (ref 26.0–34.0)
MCHC: 33.6 g/dL (ref 32.0–36.0)
MCV: 85.3 fL (ref 80.0–100.0)
Platelets: 258 10*3/uL (ref 150–440)
RBC: 3.78 MIL/uL — AB (ref 3.80–5.20)
RDW: 14.5 % (ref 11.5–14.5)
WBC: 13.6 10*3/uL — ABNORMAL HIGH (ref 3.6–11.0)

## 2015-11-25 LAB — DIFFERENTIAL
Basophils Absolute: 0.1 10*3/uL (ref 0–0.1)
Basophils Relative: 0 %
Eosinophils Absolute: 0.1 10*3/uL (ref 0–0.7)
Eosinophils Relative: 1 %
Lymphocytes Relative: 20 %
Lymphs Abs: 2.7 10*3/uL (ref 1.0–3.6)
Monocytes Absolute: 0.6 10*3/uL (ref 0.2–0.9)
Monocytes Relative: 4 %
Neutro Abs: 10.3 10*3/uL — ABNORMAL HIGH (ref 1.4–6.5)
Neutrophils Relative %: 75 %

## 2015-11-25 LAB — TYPE AND SCREEN
ABO/RH(D): O NEG
Antibody Screen: NEGATIVE
Extend sample reason: UNDETERMINED

## 2015-11-25 LAB — SURGICAL PCR SCREEN
MRSA, PCR: NEGATIVE
Staphylococcus aureus: NEGATIVE

## 2015-11-25 NOTE — Patient Instructions (Signed)
  Your procedure is scheduled ZO:XWRUEAVWon:Thursday June 8 , 2017. Report to Emergency room at 6:45 am.   Remember: Instructions that are not followed completely may result in serious medical risk, up to and including death, or upon the discretion of your surgeon and anesthesiologist your surgery may need to be rescheduled.    _x___ 1. Do not eat food or drink liquids after midnight. No gum chewing or hard candies.     ____ 2. No Alcohol for 24 hours before or after surgery.   ____ 3. Bring all medications with you on the day of surgery if instructed.    __x__ 4. Notify your doctor if there is any change in your medical condition     (cold, fever, infections).     Do not wear jewelry, make-up, hairpins, clips or nail polish.  Do not wear lotions, powders, or perfumes. You may wear deodorant.  Do not shave 48 hours prior to surgery. Men may shave face and neck.  Do not bring valuables to the hospital.    [facility] any belongings or valuables.               Contacts, dentures or bridgework may not be worn into surgery.  Leave your suitcase in the car. After surgery it may be brought to your room.  For patients admitted to the hospital, discharge time is determined by your treatment team.   Patients discharged the day of surgery will not be allowed to drive home.    Please read over the following fact sheets that you were given:   [facility]  ____ Take these medicines the morning of surgery with A SIP OF WATER: None    ____ Fleet Enema (as directed)   _x___ Use SAGE as directed on instruction sheet  _x___ Use inhalers on the day of surgery and bring to hospital day of surgery  ____ Stop metformin 2 days prior to surgery    ____ Take 1/2 of usual insulin dose the night before surgery and none on the morning of surgery.   ____ Stop Coumadin/Plavix/aspirin on does not apply.  ____ Stop Anti-inflammatories such as Advil, Aleve, Ibuprofen,  Motrin, Naproxen,  Naprosyn, Goodies powders or aspirin products.   ____ Stop supplements until after surgery.    ____ Bring C-Pap to the hospital.

## 2015-11-26 ENCOUNTER — Inpatient Hospital Stay: Payer: No Typology Code available for payment source | Admitting: Anesthesiology

## 2015-11-26 ENCOUNTER — Inpatient Hospital Stay
Admission: RE | Admit: 2015-11-26 | Discharge: 2015-11-29 | DRG: 765 | Disposition: A | Discharge status: Home / Self Care | Payer: No Typology Code available for payment source | Source: Ambulatory Visit | Attending: Obstetrics & Gynecology | Admitting: Obstetrics & Gynecology

## 2015-11-26 ENCOUNTER — Encounter
Admission: RE | Disposition: A | Discharge status: Home / Self Care | Payer: Self-pay | Source: Ambulatory Visit | Attending: Obstetrics & Gynecology

## 2015-11-26 DIAGNOSIS — O99844 Bariatric surgery status complicating childbirth: Secondary | ICD-10-CM | POA: Diagnosis present

## 2015-11-26 DIAGNOSIS — F17210 Nicotine dependence, cigarettes, uncomplicated: Secondary | ICD-10-CM | POA: Diagnosis present

## 2015-11-26 DIAGNOSIS — O1092 Unspecified pre-existing hypertension complicating childbirth: Secondary | ICD-10-CM | POA: Diagnosis present

## 2015-11-26 DIAGNOSIS — D509 Iron deficiency anemia, unspecified: Secondary | ICD-10-CM | POA: Diagnosis present

## 2015-11-26 DIAGNOSIS — O34211 Maternal care for low transverse scar from previous cesarean delivery: Principal | ICD-10-CM | POA: Diagnosis present

## 2015-11-26 DIAGNOSIS — Z3A39 39 weeks gestation of pregnancy: Secondary | ICD-10-CM

## 2015-11-26 DIAGNOSIS — K660 Peritoneal adhesions (postprocedural) (postinfection): Secondary | ICD-10-CM | POA: Diagnosis present

## 2015-11-26 DIAGNOSIS — J45909 Unspecified asthma, uncomplicated: Secondary | ICD-10-CM | POA: Diagnosis present

## 2015-11-26 DIAGNOSIS — O99334 Smoking (tobacco) complicating childbirth: Secondary | ICD-10-CM | POA: Diagnosis present

## 2015-11-26 DIAGNOSIS — D62 Acute posthemorrhagic anemia: Secondary | ICD-10-CM | POA: Diagnosis present

## 2015-11-26 DIAGNOSIS — O9902 Anemia complicating childbirth: Secondary | ICD-10-CM | POA: Diagnosis present

## 2015-11-26 DIAGNOSIS — O99513 Diseases of the respiratory system complicating pregnancy, third trimester: Secondary | ICD-10-CM | POA: Diagnosis present

## 2015-11-26 LAB — HIV ANTIBODY (ROUTINE TESTING W REFLEX): HIV SCREEN 4TH GENERATION: NONREACTIVE

## 2015-11-26 LAB — CREATININE, SERUM: CREATININE: 0.52 mg/dL (ref 0.44–1.00)

## 2015-11-26 LAB — RPR: RPR Ser Ql: NONREACTIVE

## 2015-11-26 SURGERY — Surgical Case
Anesthesia: Regional

## 2015-11-26 MED ORDER — WITCH HAZEL-GLYCERIN EX PADS: 1.0000 "application " | MEDICATED_PAD | CUTANEOUS | Status: DC | PRN

## 2015-11-26 MED ORDER — CLINDAMYCIN PHOSPHATE 900 MG/50ML IV SOLN
900.0000 mg | INTRAVENOUS | Status: AC
  Administered 2015-11-26: 900 mg via INTRAVENOUS
  Filled 2015-11-26: qty 50

## 2015-11-26 MED ORDER — SIMETHICONE 80 MG PO CHEW: 160.0000 mg | CHEWABLE_TABLET | Freq: Four times a day (QID) | ORAL | Status: DC | PRN

## 2015-11-26 MED ORDER — KETOROLAC TROMETHAMINE 30 MG/ML IJ SOLN
30.0000 mg | Freq: Four times a day (QID) | INTRAMUSCULAR | Status: DC
  Administered 2015-11-26 – 2015-11-27 (×3): 30 mg via INTRAVENOUS

## 2015-11-26 MED ORDER — SODIUM CHLORIDE FLUSH 0.9 % IV SOLN
INTRAVENOUS | Status: AC
  Filled 2015-11-26: qty 20

## 2015-11-26 MED ORDER — LACTATED RINGERS IV SOLN
INTRAVENOUS | Status: DC
  Administered 2015-11-26: 08:00:00 via INTRAVENOUS

## 2015-11-26 MED ORDER — TETANUS-DIPHTH-ACELL PERTUSSIS 5-2.5-18.5 LF-MCG/0.5 IM SUSP: 0.5000 mL | Freq: Once | INTRAMUSCULAR | Status: DC

## 2015-11-26 MED ORDER — LACTATED RINGERS IV BOLUS (SEPSIS): 1000.0000 mL | Freq: Once | INTRAVENOUS | Status: DC

## 2015-11-26 MED ORDER — DEXTROSE 5 % IV SOLN
1.0000 ug/kg/h | INTRAVENOUS | Status: DC | PRN
Start: 2015-11-26 — End: 2015-11-29

## 2015-11-26 MED ORDER — NALOXONE HCL 0.4 MG/ML IJ SOLN: 0.4000 mg | INTRAMUSCULAR | Status: DC | PRN

## 2015-11-26 MED ORDER — SODIUM CHLORIDE 0.9 % IV SOLN: 250.0000 mL | INTRAVENOUS | Status: DC

## 2015-11-26 MED ORDER — BUPIVACAINE LIPOSOME 1.3 % IJ SUSP
20.0000 mL | Freq: Once | INTRAMUSCULAR | Status: DC
  Filled 2015-11-26: qty 20

## 2015-11-26 MED ORDER — ONDANSETRON HCL 4 MG/2ML IJ SOLN: 4.0000 mg | Freq: Once | INTRAMUSCULAR | Status: DC | PRN

## 2015-11-26 MED ORDER — BUPIVACAINE IN DEXTROSE 0.75-8.25 % IT SOLN
INTRATHECAL | Status: DC | PRN
  Administered 2015-11-26: 1.6 mL via INTRATHECAL

## 2015-11-26 MED ORDER — COCONUT OIL OIL: 1.0000 "application " | TOPICAL_OIL | Status: DC | PRN

## 2015-11-26 MED ORDER — BUPIVACAINE LIPOSOME 1.3 % IJ SUSP
INTRAMUSCULAR | Status: DC | PRN
  Administered 2015-11-26: 70 mL

## 2015-11-26 MED ORDER — NALBUPHINE HCL 10 MG/ML IJ SOLN: 5.0000 mg | Freq: Once | INTRAMUSCULAR | Status: DC | PRN

## 2015-11-26 MED ORDER — DIPHENHYDRAMINE HCL 25 MG PO CAPS
25.0000 mg | ORAL_CAPSULE | Freq: Four times a day (QID) | ORAL | Status: DC | PRN
Start: 2015-11-26 — End: 2015-11-26

## 2015-11-26 MED ORDER — NALBUPHINE HCL 10 MG/ML IJ SOLN: 5.0000 mg | INTRAMUSCULAR | Status: DC | PRN

## 2015-11-26 MED ORDER — ACETAMINOPHEN 500 MG PO TABS
1000.0000 mg | ORAL_TABLET | Freq: Four times a day (QID) | ORAL | Status: AC
  Administered 2015-11-27: 1000 mg via ORAL
  Filled 2015-11-26: qty 2

## 2015-11-26 MED ORDER — FENTANYL CITRATE (PF) 100 MCG/2ML IJ SOLN: 25.0000 ug | INTRAMUSCULAR | Status: DC | PRN

## 2015-11-26 MED ORDER — ENOXAPARIN SODIUM 40 MG/0.4ML ~~LOC~~ SOLN
40.0000 mg | SUBCUTANEOUS | Status: DC
  Filled 2015-11-26: qty 0.4

## 2015-11-26 MED ORDER — MENTHOL 3 MG MT LOZG: 1.0000 | LOZENGE | OROMUCOSAL | Status: DC | PRN

## 2015-11-26 MED ORDER — PHENYLEPHRINE HCL 10 MG/ML IJ SOLN
INTRAMUSCULAR | Status: DC | PRN
Start: 2015-11-26 — End: 2015-11-26
  Administered 2015-11-26 (×4): 50 ug via INTRAVENOUS
  Administered 2015-11-26: 100 ug via INTRAVENOUS
  Administered 2015-11-26 (×2): 50 ug via INTRAVENOUS
  Administered 2015-11-26: 100 ug via INTRAVENOUS
  Administered 2015-11-26 (×8): 50 ug via INTRAVENOUS

## 2015-11-26 MED ORDER — MORPHINE SULFATE (PF) 0.5 MG/ML IJ SOLN
INTRAMUSCULAR | Status: DC | PRN
  Administered 2015-11-26: .2 mg via INTRATHECAL

## 2015-11-26 MED ORDER — OXYCODONE HCL 5 MG PO TABS
10.0000 mg | ORAL_TABLET | ORAL | Status: DC | PRN
  Administered 2015-11-27 – 2015-11-29 (×12): 10 mg via ORAL
  Filled 2015-11-26 (×11): qty 2

## 2015-11-26 MED ORDER — CHLORHEXIDINE GLUCONATE CLOTH 2 % EX PADS: 6.0000 | MEDICATED_PAD | Freq: Every day | CUTANEOUS | Status: DC

## 2015-11-26 MED ORDER — KETOROLAC TROMETHAMINE 30 MG/ML IJ SOLN
30.0000 mg | Freq: Four times a day (QID) | INTRAMUSCULAR | Status: AC | PRN
  Filled 2015-11-26 (×3): qty 1

## 2015-11-26 MED ORDER — DOCUSATE SODIUM 100 MG PO CAPS
100.0000 mg | ORAL_CAPSULE | Freq: Two times a day (BID) | ORAL | Status: DC
  Administered 2015-11-26: 100 mg via ORAL
  Filled 2015-11-26 (×3): qty 1

## 2015-11-26 MED ORDER — SODIUM CHLORIDE 0.9% FLUSH: 3.0000 mL | INTRAVENOUS | Status: DC | PRN

## 2015-11-26 MED ORDER — FENTANYL CITRATE (PF) 100 MCG/2ML IJ SOLN
INTRAMUSCULAR | Status: DC | PRN
  Administered 2015-11-26: 20 ug via INTRATHECAL

## 2015-11-26 MED ORDER — BUPIVACAINE HCL (PF) 0.5 % IJ SOLN
30.0000 mL | Freq: Once | INTRAMUSCULAR | Status: DC
  Filled 2015-11-26: qty 30

## 2015-11-26 MED ORDER — OXYTOCIN 40 UNITS IN LACTATED RINGERS INFUSION - SIMPLE MED
2.5000 [IU]/h | INTRAVENOUS | Status: AC
  Administered 2015-11-26: 2.5 [IU]/h via INTRAVENOUS
  Filled 2015-11-26: qty 1000

## 2015-11-26 MED ORDER — PRENATAL MULTIVITAMIN CH
1.0000 | ORAL_TABLET | Freq: Every day | ORAL | Status: DC
  Filled 2015-11-26: qty 1

## 2015-11-26 MED ORDER — MEPERIDINE HCL 25 MG/ML IJ SOLN: 6.2500 mg | INTRAMUSCULAR | Status: DC | PRN

## 2015-11-26 MED ORDER — SOD CITRATE-CITRIC ACID 500-334 MG/5ML PO SOLN
30.0000 mL | ORAL | Status: AC
  Administered 2015-11-26: 30 mL via ORAL
  Filled 2015-11-26: qty 30

## 2015-11-26 MED ORDER — ONDANSETRON HCL 4 MG/2ML IJ SOLN
INTRAMUSCULAR | Status: DC | PRN
  Administered 2015-11-26: 4 mg via INTRAVENOUS

## 2015-11-26 MED ORDER — GENTAMICIN SULFATE 40 MG/ML IJ SOLN
5.0000 mg/kg | INTRAVENOUS | Status: AC
  Administered 2015-11-26: 400 mg via INTRAVENOUS
  Filled 2015-11-26: qty 10

## 2015-11-26 MED ORDER — DIPHENHYDRAMINE HCL 25 MG PO CAPS: 25.0000 mg | ORAL_CAPSULE | ORAL | Status: DC | PRN

## 2015-11-26 MED ORDER — SODIUM CHLORIDE 0.9% FLUSH: 3.0000 mL | Freq: Two times a day (BID) | INTRAVENOUS | Status: DC

## 2015-11-26 MED ORDER — OXYCODONE HCL 5 MG PO TABS
5.0000 mg | ORAL_TABLET | ORAL | Status: DC | PRN
  Administered 2015-11-27: 5 mg via ORAL
  Filled 2015-11-26 (×3): qty 1

## 2015-11-26 MED ORDER — ONDANSETRON HCL 4 MG/2ML IJ SOLN: 4.0000 mg | Freq: Three times a day (TID) | INTRAMUSCULAR | Status: DC | PRN

## 2015-11-26 MED ORDER — KETOROLAC TROMETHAMINE 30 MG/ML IJ SOLN: 30.0000 mg | Freq: Four times a day (QID) | INTRAMUSCULAR | Status: AC | PRN

## 2015-11-26 MED ORDER — KETOROLAC TROMETHAMINE 30 MG/ML IJ SOLN
INTRAMUSCULAR | Status: DC | PRN
  Administered 2015-11-26: 30 mg via INTRAVENOUS

## 2015-11-26 MED ORDER — ACETAMINOPHEN 650 MG RE SUPP
650.0000 mg | RECTAL | Status: DC | PRN
  Filled 2015-11-26: qty 1

## 2015-11-26 MED ORDER — DIBUCAINE 1 % RE OINT: 1.0000 "application " | TOPICAL_OINTMENT | RECTAL | Status: DC | PRN

## 2015-11-26 MED ORDER — LACTATED RINGERS IV SOLN
INTRAVENOUS | Status: DC
  Administered 2015-11-27: 06:00:00 via INTRAVENOUS

## 2015-11-26 MED ORDER — EPHEDRINE SULFATE 50 MG/ML IJ SOLN
INTRAMUSCULAR | Status: DC | PRN
  Administered 2015-11-26: 5 mg via INTRAVENOUS

## 2015-11-26 MED ORDER — OXYTOCIN 40 UNITS IN LACTATED RINGERS INFUSION - SIMPLE MED
INTRAVENOUS | Status: AC
  Administered 2015-11-26: 800 mL via INTRAVENOUS
  Filled 2015-11-26: qty 1000

## 2015-11-26 MED ORDER — ACETAMINOPHEN 500 MG PO TABS: 1000.0000 mg | ORAL_TABLET | Freq: Four times a day (QID) | ORAL | Status: DC

## 2015-11-26 MED ORDER — DIPHENHYDRAMINE HCL 50 MG/ML IJ SOLN: 12.5000 mg | INTRAMUSCULAR | Status: DC | PRN

## 2015-11-26 SURGICAL SUPPLY — 31 items
CANISTER SUCT 3000ML (MISCELLANEOUS) ×2 IMPLANT
CATH KIT ON-Q SILVERSOAK 5IN (CATHETERS) IMPLANT
DRESSING SURGICEL FIBRLLR 1X2 (HEMOSTASIS) ×1 IMPLANT
DRSG SURGICEL FIBRILLAR 1X2 (HEMOSTASIS) ×2
DRSG TELFA 3X8 NADH (GAUZE/BANDAGES/DRESSINGS) ×2 IMPLANT
ELECT CAUTERY BLADE 6.4 (BLADE) IMPLANT
ELECT REM PT RETURN 9FT ADLT (ELECTROSURGICAL) ×2
ELECTRODE REM PT RTRN 9FT ADLT (ELECTROSURGICAL) ×1 IMPLANT
GAUZE SPONGE 4X4 12PLY STRL (GAUZE/BANDAGES/DRESSINGS) ×2 IMPLANT
GLOVE BIOGEL PI IND STRL 6.5 (GLOVE) ×4 IMPLANT
GLOVE BIOGEL PI INDICATOR 6.5 (GLOVE) ×4
GLOVE SURG SYN 6.5 ES PF (GLOVE) ×8 IMPLANT
GOWN STRL REUS W/ TWL LRG LVL3 (GOWN DISPOSABLE) ×3 IMPLANT
GOWN STRL REUS W/TWL LRG LVL3 (GOWN DISPOSABLE) ×3
LIQUID BAND (GAUZE/BANDAGES/DRESSINGS) ×4 IMPLANT
NS IRRIG 1000ML POUR BTL (IV SOLUTION) ×2 IMPLANT
PACK C SECTION AR (MISCELLANEOUS) ×2 IMPLANT
PAD OB MATERNITY 4.3X12.25 (PERSONAL CARE ITEMS) ×2 IMPLANT
PAD PREP 24X41 OB/GYN DISP (PERSONAL CARE ITEMS) ×2 IMPLANT
STRAP SAFETY BODY (MISCELLANEOUS) ×2 IMPLANT
STRIP CLOSURE SKIN 1/2X4 (GAUZE/BANDAGES/DRESSINGS) ×2 IMPLANT
SUT MNCRL 4-0 (SUTURE) ×1
SUT MNCRL 4-0 27XMFL (SUTURE) ×1
SUT PDS AB 1 TP1 96 (SUTURE) ×4 IMPLANT
SUT VIC AB 0 CT1 36 (SUTURE) ×4 IMPLANT
SUT VIC AB 2-0 CT1 27 (SUTURE) ×2
SUT VIC AB 2-0 CT1 TAPERPNT 27 (SUTURE) ×2 IMPLANT
SUT VIC AB 3-0 SH 27 (SUTURE) ×1
SUT VIC AB 3-0 SH 27X BRD (SUTURE) ×1 IMPLANT
SUTURE MNCRL 4-0 27XMF (SUTURE) ×1 IMPLANT
SWABSTK COMLB BENZOIN TINCTURE (MISCELLANEOUS) ×2 IMPLANT

## 2015-11-26 NOTE — Anesthesia Procedure Notes (Signed)
Spinal Patient location during procedure: OB Staffing Anesthesiologist: Katy Fitch K Performed by: anesthesiologist  Preanesthetic Checklist Completed: patient identified, site marked, surgical consent, pre-op evaluation, timeout performed, IV checked, risks and benefits discussed and monitors and equipment checked Spinal Block Patient position: sitting Prep: Betadine Patient monitoring: heart rate, continuous pulse ox, blood pressure and cardiac monitor Approach: midline Location: L4-5 Injection technique: single-shot Needle Needle type: Whitacre and Introducer  Needle gauge: 27 G Needle length: 9 cm Assessment Sensory level: T4 Additional Notes Negative paresthesia. Negative blood return. Positive free-flowing CSF. Expiration date of kit checked and confirmed. Patient tolerated procedure well, without complications.

## 2015-11-26 NOTE — Transfer of Care (Signed)
Immediate Anesthesia Transfer of Care Note  Patient: Michelle Kelley  Procedure(s) Performed: Procedure(s): CESAREAN SECTION (N/A)  Patient Location: PACU  Anesthesia Type:Spinal  Level of Consciousness: awake, alert  and oriented  Airway & Oxygen Therapy: Patient Spontanous Breathing  Post-op Assessment: Report given to RN and Post -op Vital signs reviewed and stable  Post vital signs: Reviewed and stable  Last Vitals:  Filed Vitals:   11/26/15 0753 11/26/15 0754  BP: 156/129 121/89  Pulse: 88 86  Temp:    Resp:  18    Last Pain: There were no vitals filed for this visit.       Complications: No apparent anesthesia complications

## 2015-11-26 NOTE — Discharge Summary (Signed)
Obstetrical Discharge Summary  Patient Name: Michelle Kelley DOB: 07/19/1985 MRN: [id number]  Date of Admission: 11/26/2015 Date of Discharge: 11/29/2015 Primary OB: [facility]   Gestational Age at Delivery: [id number]   Antepartum complications: history of gastric bypass, mood disorder/ PTSD/OCD, CHTN, asthma Admitting Diagnosis: Prior Cesarean section, term pregnancy Secondary Diagnosis: Patient Active Problem List   Diagnosis Date Noted  . Postoperative anemia due to acute blood loss 11/29/2015  . Postpartum care following cesarean delivery 11/27/2015  . Marijuana abuse 10/13/2015  . Hx of preeclampsia, prior pregnancy, currently pregnant 10/07/2015  . Schizophrenia (HCC) 10/07/2015  . Manic depressive disorder (HCC) 10/07/2015  . History of C-section 10/07/2015  . Obesity 10/07/2015  . Tobacco user 10/07/2015  . Substance use disorder 10/07/2015  . Ingrown toenail 09/01/2015  . Episodic mood disorder (HCC) 04/18/2007  . Headache, migraine 04/04/2007    Augmentation: none Complications: None Intrapartum complications/course:  Date of Delivery:  Delivered By: Ranae Plumberhelsea Ward Delivery Type: repeat cesarean section, low transverse incision Anesthesia: epidural Placenta: sponatneous Laceration:  Episiotomy: none Newborn Data: Live born female / Chance Birth Weight: 6 lb 14.8 oz (3140 g) APGAR: 8, 9    Discharge Physical Exam: 11/29/2015  BP 140/83 mmHg  Pulse 79  Temp(Src) 98.7 F (37.1 C) (Oral)  Resp 18  Ht 5\' 2"  (1.575 m)  Wt 79.379 kg (175 lb)  BMI 32.00 kg/m2  SpO2 100%  Breastfeeding? Unknown  General: NAD, smiling, feeding baby CV: RRR with Grade II-III/VI systolic murmur all fields Pulm: CTABL, nl effort ABD: s/nd/nt, BS active (passing flatus) Lochia: appropriate, small Incision: c/d/i DVT Evaluation: Right:LE non-ttp, no evidence of DVT on exam. Wearing boot on left lower extremity  HEMOGLOBIN  Date Value Ref Range Status  11/27/2015 7.5* 12.0 -  16.0 g/dL Final    Comment:    RESULT REPEATED AND VERIFIED  04/22/2015 12.4 g/dL Final   HGB  Date Value Ref Range Status  06/23/2011 12.5 12.0-16.0 g/dL Final   HCT  Date Value Ref Range Status  11/27/2015 22.7* 35.0 - 47.0 % Final  04/22/2015 37 % Final  06/23/2011 37.1 35.0-47.0 % Final    Post partum course: Remarkable for some blood pressure elevations and anemia with hemoglobin of 7.5gm/dl. In the last 24 hours blood pressures have ranged from 133/74-153/73. She was started on iron for anemia and denies any lightheadedness when up OOB. On POD #3 she was discharged home voiding without difficulty, ambulating without assist, passing flatus, and tolerating a regular diet.  Postpartum Procedures: none Disposition: stable, discharge to home. Baby Feeding: formula Baby Disposition: home with mom  Rh Immune globulin given: baby and mom O neg-Rhogam not indicated Rubella vaccine given: NA Tdap vaccine given in AP or PP setting: yes Flu vaccine given in AP or PP setting: NA  Contraception: possible IUD  Prenatal Labs:     Plan:  Michelle Kelley was discharged to home in good condition. [facility] Dr Elesa MassedWard this week   Discharge Medications:   Medication List    TAKE these medications        iron polysaccharides 150 MG capsule  Commonly known as:  NIFEREX  Take 1 capsule (150 mg total) by mouth daily.     multivitamin tablet  Take 1 tablet by mouth daily.     oxyCODONE 5 MG immediate release tablet  Commonly known as:  Oxy IR/ROXICODONE  Take one to two tablets every six hours if needed for moderate to  severe pain       May also take colace-one or two tabs daily for stool softener Follow-up Information    Follow up with Elenora Fender Ward, MD In 6 weeks.   Specialty:  Obstetrics and Gynecology   Contact information:   [address] [place] [address] [phone]       Go to Leola Brazil, MD.   Specialty:   Obstetrics and Gynecology   Why:  your incision check and blood pressure check this week as scheduled   Contact information:   [address] [place] [address] [phone]       Signed:Grey Schlauch, Jill Side, CNM

## 2015-11-26 NOTE — Anesthesia Preprocedure Evaluation (Signed)
Anesthesia Evaluation  Patient identified by MRN, date of birth, ID band Patient awake    Reviewed: Allergy & Precautions, H&P , NPO status , Patient's Chart, lab work & pertinent test results, reviewed documented beta blocker date and time   History of Anesthesia Complications (+) Emergence Delirium and history of anesthetic complications  Airway Mallampati: III  TM Distance: >3 FB Neck ROM: full    Dental no notable dental hx. (+) Teeth Intact   Pulmonary neg pulmonary ROS, neg shortness of breath, asthma , Current Smoker,    Pulmonary exam normal breath sounds clear to auscultation       Cardiovascular Exercise Tolerance: Good hypertension, negative cardio ROS Normal cardiovascular exam Rhythm:regular Rate:Normal     Neuro/Psych  Headaches, PSYCHIATRIC DISORDERS Anxiety Bipolar Disorder Schizophrenia negative neurological ROS  negative psych ROS   GI/Hepatic negative GI ROS, Neg liver ROS,   Endo/Other  negative endocrine ROS  Renal/GU negative Renal ROS  negative genitourinary   Musculoskeletal   Abdominal   Peds  Hematology negative hematology ROS (+)   Anesthesia Other Findings   Reproductive/Obstetrics (+) Pregnancy                             Anesthesia Physical Anesthesia Plan  ASA: II  Anesthesia Plan: Regional and Spinal   Post-op Pain Management:    Induction:   Airway Management Planned:   Additional Equipment:   Intra-op Plan:   Post-operative Plan:   Informed Consent: I have reviewed the patients History and Physical, chart, labs and discussed the procedure including the risks, benefits and alternatives for the proposed anesthesia with the patient or authorized representative who has indicated his/her understanding and acceptance.     Plan Discussed with: CRNA  Anesthesia Plan Comments: (DW pt possibility of potential needs for perioperative sedatives  given her past hx. Of anxiety and anesthesia emergence delerium. JA)        Anesthesia Quick Evaluation

## 2015-11-26 NOTE — Op Note (Signed)
Cesarean Section Procedure Note  11/26/2015  Patient:  Dorisann FramesKristina K Thomas  30 y.o. female Preoperative diagnosis:  term pregnancy,prior c-section Postoperative diagnosis:  term pregnancy,prior c-section  PROCEDURE:  Procedure(s): CESAREAN SECTION (N/A) Surgeon:  Surgeon(s) and Role:    * Yaire Kreher Salena Saner Willi Borowiak, MD - Primary    * Nadara Mustardobert P Harris, MD - Assisting Anesthesia:  spinal I/O: Total I/O In: 900 [I.V.:900] Out: 550 [Urine:150; Blood:400] Specimens:  Cord Blood Complications: None Apparent Disposition:  VS stable to PACU  Findings:  Normal appearing tubes, ovaries, and uterus with filmy adhesions of the uterus to peritoneum on the right in the LUS, and 2cm adhesion of omentum to anterior abdominal wall.  Live born female Birth Weight: 6 lb 14.8 oz (3140 g) APGAR: 8, 9  Indication for procedure: 17XW 29 with history of LTCS, currently pregnant at 39 completed weeks of pregnancy who elected for repeat cesarean after being counseled of risks and benefits of vaginal delivery after cesarean.  Her cervix was not favorable.     Procedure Details   The risks, benefits, complications, treatment options, and expected outcomes were discussed with the patient. Informed consent was obtained. The patient was taken to Operating Room, identified as Dorisann FramesKristina K Thomas and the procedure verified as a cesarean delivery.   After administration of anesthesia, the patient was prepped and draped in the usual sterile manner, including a vaginal prep. A surgical time out was performed, with the pediatric team present. Anesthesia was tested for adequacy.  A Pfannenstiel incision was made and carried down through the subcutaneous tissue to the fascia. Fascial incision was made and extended transversely. The fascia was separated from the underlying rectus tissue superiorly and inferiorly. The peritoneum was identified and entered. Peritoneal incision was extended longitudinally.  The bovie was used to ligate the  omentum at the anterior abdominal wall and tucked cephalad out of the surgical field.   A low transverse uterine incision was made. Delivered from cephalic presentation was a Live born female. Delayed cord clamping was performed for 60 seconds, and we sang Happy Birthday to Baby Chance. The umbilical cord was doubly clamped and cut, and the baby was handed off to the awaitng pediatrician.  Cord blood was obtained for evaluation. The placenta was removed intact and appeared normal. The uterus was cleared of clots, membranes, and debris. The uterus, tubes and ovaries appeared normal. The uterine incision was closed with running locking sutures of 0 Vicryl, and then a second, imbricating stitch was placed. Hemostasis was observed. The abdominal cavity was evacuated of extraneous fluid. The uterus was returned to the abdominal cavity and again the incision was inspected for hemostasis, which was confirmed.  The paracolic gutters were cleaned.  Due to large diastasis, the rectus muscles were reapproximated with horizonatal mattress The fascia was then reapproximated with running suture of vicryl. 30ccs of Exparel and Marcaine mix was injected into the fascia.  After a change of gloves, the subcutaneous tissue was irrigated and reapproximated with 3-0 vicryl. The skin was closed with 4-0 Monocryl.  The remaining 40cc of Exparel/Marciane mix was injected into the skin and subcutaneous tissues. The incision was covered with surgical glue.  Instrument, sponge, and needle counts were correct prior the abdominal closure and at the conclusion of the case.   I was present and performed this procedure in its entirety.  ----- Ranae Plumberhelsea Taira Knabe, MD Attending Obstetrician and Gynecologist [facility]

## 2015-11-27 LAB — CBC
HEMATOCRIT: 22.7 % — AB (ref 35.0–47.0)
HEMOGLOBIN: 7.5 g/dL — AB (ref 12.0–16.0)
MCH: 28 pg (ref 26.0–34.0)
MCHC: 33.3 g/dL (ref 32.0–36.0)
MCV: 84.2 fL (ref 80.0–100.0)
Platelets: 207 10*3/uL (ref 150–440)
RBC: 2.69 MIL/uL — AB (ref 3.80–5.20)
RDW: 14.3 % (ref 11.5–14.5)
WBC: 14.4 10*3/uL — AB (ref 3.6–11.0)

## 2015-11-27 MED ORDER — ACETAMINOPHEN 325 MG PO TABS
650.0000 mg | ORAL_TABLET | ORAL | Status: DC | PRN
  Administered 2015-11-27 – 2015-11-29 (×11): 650 mg via ORAL
  Filled 2015-11-27: qty 2
  Filled 2015-11-27: qty 1
  Filled 2015-11-27 (×10): qty 2

## 2015-11-27 MED ORDER — OXYCODONE HCL 5 MG PO TABS
5.0000 mg | ORAL_TABLET | Freq: Once | ORAL | Status: AC
  Administered 2015-11-27: 5 mg via ORAL
  Filled 2015-11-27: qty 1

## 2015-11-27 NOTE — Progress Notes (Addendum)
  Post-operative Day 1  Subjective: no complaints, up ad lib, voiding and tolerating PO  Ambulating without difficulty or c/o dizzyness  Objective: Blood pressure 120/67, pulse 69, temperature 97.6 F (36.4 C), temperature source Oral, resp. rate 14, height 5\' 2"  (1.575 m), weight 175 lb (79.379 kg), SpO2 95 %,   Physical Exam:  General: alert and cooperative Lochia: appropriate Uterine Fundus: firm Incision: healing well, no significant drainage, On-Q intact DVT Evaluation: No evidence of DVT seen on physical exam. Abdomen: soft, NT   Recent Labs  11/25/15 1241 11/27/15 0645  HGB 10.8* 7.5*  HCT 32.2* 22.7*    Assessment POD #1, acute blood loss and chronic iron deficiency anemia  Plan: Continue PO care, Advance activity as tolerated and Fe replacement, anemia precautions  Contraception: Skyla Blood Type: O negative/baby O negative RI/VI TDAP UTD  Lovenox was ordered for risk of decreased mobility, pt declined. Ambulating well without incidence.  Michelle Kelley, Michelle Kelley, [place] 11/27/2015, 1:28 PM

## 2015-11-27 NOTE — Anesthesia Post-op Follow-up Note (Signed)
  Anesthesia Pain Follow-up Note  Patient: Michelle FramesKristina K Thomas  Day #: 1  Date of Follow-up: 11/27/2015 Time: 7:13 AM  Last Vitals:  Filed Vitals:   11/26/15 2343 11/27/15 0308  BP: 122/70 130/62  Pulse: 69 70  Temp: 37 C 36.7 C  Resp: 12 16    Level of Consciousness: alert  Pain: none   Side Effects:None  Catheter Site Exam:N/A  Plan: D/C from anesthesia care  [facility] Kenneth Cuaresma

## 2015-11-27 NOTE — Anesthesia Postprocedure Evaluation (Signed)
Anesthesia Post Note  Patient: Michelle Kelley  Procedure(s) Performed: Procedure(s) (LRB): CESAREAN SECTION (N/A)  Patient location during evaluation: Mother Baby Anesthesia Type: Spinal Level of consciousness: awake, oriented and awake and alert Pain management: pain level controlled Vital Signs Assessment: post-procedure vital signs reviewed and stable Respiratory status: spontaneous breathing, nonlabored ventilation and respiratory function stable Cardiovascular status: stable Postop Assessment: no backache, no headache and no signs of nausea or vomiting Anesthetic complications: no    Last Vitals:  Filed Vitals:   11/26/15 2343 11/27/15 0308  BP: 122/70 130/62  Pulse: 69 70  Temp: 37 C 36.7 C  Resp: 12 16    Last Pain:  Filed Vitals:   11/27/15 0657  PainSc: 5                  [employer] Jkai Arwood

## 2015-11-27 NOTE — Clinical Documentation Improvement (Signed)
OB/GYN  Can the diagnosis of anemia be further specified?   Acute Blood Loss Anemia, including the suspected or known cause or associated condition(s)  Acute on chronic blood loss anemia, including the suspected or known cause or associated condition(s)  Chronic blood loss anemia, including the suspected or known cause or associated condition(s)  Precipitous drop in Hematocrit, including the suspected or known cause or associated condition(s)  Other  Clinically Undetermined  Document any associated diagnoses/conditions. Please update your documentation within the medical record to reflect your response to this query. Thank you.   Supporting Information: (As per notes) "POD #1, blood loss and iron deficiency anemia" Labs: Component     Latest Ref Rng 11/25/2015 11/27/2015  Hemoglobin     12.0 - 16.0 g/dL 16.110.8 (L) 7.5 (L)  HCT     35.0 - 47.0 % 32.2 (L) 22.7 (L)     Please exercise your independent, professional judgment when responding. A specific answer is not anticipated or expected.   Thank You,  Nevin BloodgoodJoan B Orly Quimby Health Information Management [place] [phone]

## 2015-11-28 MED ORDER — POLYSACCHARIDE IRON COMPLEX 150 MG PO CAPS
150.0000 mg | ORAL_CAPSULE | Freq: Every day | ORAL | Status: DC
  Administered 2015-11-28 – 2015-11-29 (×2): 150 mg via ORAL
  Filled 2015-11-28 (×2): qty 1

## 2015-11-28 MED ORDER — FERROUS SULFATE 325 (65 FE) MG PO TABS: 325.0000 mg | ORAL_TABLET | Freq: Two times a day (BID) | ORAL | Status: DC

## 2015-11-28 MED ORDER — DOCUSATE SODIUM 100 MG PO CAPS
100.0000 mg | ORAL_CAPSULE | Freq: Every day | ORAL | Status: DC
  Administered 2015-11-29: 100 mg via ORAL
  Filled 2015-11-28: qty 1

## 2015-11-28 MED ORDER — VITAMIN C 500 MG PO TABS: 500.0000 mg | ORAL_TABLET | Freq: Two times a day (BID) | ORAL | Status: DC

## 2015-11-28 NOTE — Progress Notes (Signed)
POD #2 repeat Cesarean section Subjective:   Tolerating regular diet. Passing gas and had a BM. Drinking a lot of caffeine products and eating ice. Denies being lightheaded when up. History of CHTN, but "does not like taking medicine." Bottle  Objective:  Blood pressure 153/73, pulse 81, temperature 97.8 F (36.6 C), temperature source Oral, resp. rate 18, height 5' 2"  (1.575 m), weight 79.379 kg (175 lb), SpO2 99 %, unknown if currently breastfeeding. Patient Vitals for the past 24 hrs:  BP Temp Temp src Pulse Resp SpO2  11/28/15 1415 (!) 153/73 mmHg - - 81 - -  11/28/15 1231 (!) 127/99 mmHg 97.8 F (36.6 C) Oral 88 18 99 %  11/28/15 0829 (!) 141/76 mmHg 98.2 F (36.8 C) Oral 70 18 99 %  11/28/15 0300 - 98 F (36.7 C) Oral - - -  11/27/15 2342 - 98.2 F (36.8 C) Oral - - -  11/27/15 1912 136/89 mmHg 98.4 F (36.9 C) Oral 75 18 99 %  11/27/15 1655 (!) 120/56 mmHg - - 79 - -  11/27/15 1600 (!) 151/82 mmHg 97.5 F (36.4 C) Oral 87 18 96 %   General: NAD Heart: RRR with Grade II/VI systolic murmur Pulmonary: no increased work of breathing/ CTAB Abdomen: non-distended, non-tender,BS active Incision: C&D&I dressing Extremities: no edema, no erythema, no tenderness  Results for orders placed or performed during the hospital encounter of 11/26/15 (from the past 72 hour(s))  Creatinine, serum     Status: None   Collection Time: 11/26/15  4:59 PM  Result Value Ref Range   Creatinine, Ser 0.52 0.44 - 1.00 mg/dL   GFR calc non Af Amer >60 >60 mL/min   GFR calc Af Amer >60 >60 mL/min    Comment: (NOTE) The eGFR has been calculated using the CKD EPI equation. This calculation has not been validated in all clinical situations. eGFR's persistently <60 mL/min signify possible Chronic Kidney Disease.   CBC     Status: Abnormal   Collection Time: 11/27/15  6:45 AM  Result Value Ref Range   WBC 14.4 (H) 3.6 - 11.0 K/uL   RBC 2.69 (L) 3.80 - 5.20 MIL/uL   Hemoglobin 7.5 (L) 12.0 - 16.0  g/dL    Comment: RESULT REPEATED AND VERIFIED   HCT 22.7 (L) 35.0 - 47.0 %   MCV 84.2 80.0 - 100.0 fL   MCH 28.0 26.0 - 34.0 pg   MCHC 33.3 32.0 - 36.0 g/dL   RDW 14.3 11.5 - 14.5 %   Platelets 207 150 - 440 K/uL     Assessment:   30 y.o. [id number] postoperativeday # 2-stable  CHTN with labile blood pressures  Chronic anemia worsened with acute blood loss-asymptomatic    Plan:  1) After long discussion, finally agreed to try iron supplement.   2) --/--/O NEG (06/07 1241)/ Baby O Neg-Rhogam not indicated Charlynn Grimes Immune (11/02 0000) / Varicella immune  3) TDAP UTD   4) Bottle  5) Social work consult pending with history of two prior children given up for adoption and her mental health issues  Dalia Heading, CNM

## 2015-11-29 DIAGNOSIS — D62 Acute posthemorrhagic anemia: Secondary | ICD-10-CM | POA: Diagnosis not present

## 2015-11-29 HISTORY — DX: Acute posthemorrhagic anemia: D62

## 2015-11-29 MED ORDER — POLYSACCHARIDE IRON COMPLEX 150 MG PO CAPS: 150.0000 mg | ORAL_CAPSULE | Freq: Every day | ORAL | Status: DC

## 2015-11-29 MED ORDER — OXYCODONE HCL 5 MG PO TABS
ORAL_TABLET | ORAL | Status: DC
Start: 2015-11-29 — End: 2021-01-13

## 2015-11-29 NOTE — Clinical Social Work Maternal (Signed)
  CLINICAL SOCIAL WORK MATERNAL/CHILD NOTE  Patient Details  Name: Michelle Kelley MRN: [id number] Date of Birth: Sep 07, 1985  Date:  11/29/2015  Clinical Social Worker Initiating Note:   (CSW was consulted due to patient admitting using Marijuana during her pregancy. Also patient stated she has mental health diagnosis and 2 other children ) Date/ Time Initiated:  11/29/15/      Child's Name:      Legal Guardian:  Mother   Need for Interpreter:  None   Date of Referral:  11/29/15     Reason for Referral:  Other (Comment), Current Substance Use/Substance Use During Pregnancy  (Drug Usage and Mental Health Issues)   Referral Source:  RN   Address:     Phone number:      Household Members:  Self, Significant Other   Natural Supports (not living in the home):  Friends, Immediate Family   Professional Supports: None   Employment: Unemployed   Type of Work:     Education:  Database administrator Resources:  [employer]   Other Resources:  [employer], Physicist, medical    Cultural/Religious Considerations Which May Impact Care:  None   Strengths:  Ability to meet basic needs , Home prepared for child    Risk Factors/Current Problems:  Substance Use    Cognitive State:  Alert , Goal Oriented , Able to Concentrate    Mood/Affect:  Calm , Relaxed , Happy    CSW Assessment: CSW met with patient, baby and significant other at bedside. Per patient she lives with her significant other in a house. Reports that she is excited about her son Chace. Stated that she has had 2 other children before Chase. Stated her first child was adopted and the second child she signed over her rights. Stated that once she signed over her rights for her 2nd child she was told that her child passed away. Patient reports that she believes she's seen pictures of the child on facebook but has not addressed it. Patient reported that she does not like to think about those issues because they cause her  distress. Stated she's excited about C.hance and brining him home. Patient stated she's been diagnosed with Bipolar/ Depression and Schizophrenia. Stated that she tried to take medications for her diagnosis but stopped because it made her feel like a zombie. Stated that she self medicates by smoking "weed". Per patient she smoked while pregnant. Stated taht it helped her when she was sick and stressed out stated that she is not interested in mental health resources in [place]. Reported that she is not going to smoke around the baby. Per patient she has everything the patient needs. Stated that she has a good support system with her mother, her boyfriend's mother and her boyfriend. Reported that she is happy to be going home and is in love with Chance. Because patient's baby was not positive for substances CSW is not going to make a CPS report. Patient seems to have the support system in place to care for baby Chance. MD requested for a [facility] referral to be made. CSW will inform RNCM to make referral tomorrow 11/30/15  CSW Plan/Description:  Information/Referral to [employer] , No Further Intervention Required/No Barriers to Discharge    [place], LCSW 11/29/2015, 4:57 PM

## 2015-11-29 NOTE — Progress Notes (Signed)
Patient discharged home with infant and significant other. Discharge instructions, prescriptions and follow up appointment given to and reviewed with patient and significant other. Patient verbalized understanding. Escorted out via wheelchair by Melissa Penland, NT.  

## 2015-11-29 NOTE — Discharge Instructions (Signed)
Discharge instructions:  ° °Call office if you have any of the following: headache, visual changes, fever >101.0 F, chills, breast concerns, excessive vaginal bleeding, incision drainage or problems, leg pain or redness, depression or any other concerns.  ° °Activity: Do not lift > 10 lbs for 6 weeks.  °No intercourse or tampons for 6 weeks.  °No driving for 1-2 weeks.  ° °Call your doctor for increased pain or vaginal bleeding, temperature above 101.0, depression, or concerns.  No strenuous activity or heavy lifting for 6 weeks.  No intercourse, tampons, douching, or enemas for 6 weeks.  No tub baths-showers only.  No driving for 2 weeks or while taking pain medications.  Continue prenatal vitamin and iron.  Increase calories and fluids while breastfeeding. ° °You may have a slight fever when your milk comes in, but it should go away on its own.  If it does not, and rises above 101.0 please call the doctor. ° °For concerns about your baby, please call your pediatrician °For breastfeeding concerns, the lactation consultant can be reached at [phone] ° ° °Cesarean Delivery, Care After °Refer to this sheet in the next few weeks. These instructions provide you with information on caring for yourself after your procedure. Your health care provider may also give you specific instructions. Your treatment has been planned according to current medical practices, but problems sometimes occur. Call your health care provider if you have any problems or questions after you go home. °HOME CARE INSTRUCTIONS  °· If you have an On-Q pump, remove it on the 5th day after your surgery, by removing the dressing/bandage and pulling the pump out. Cover the site where the pump strings came out with a band-aid, as needed. °· Only take over-the-counter or prescription medications as directed by your health care provider. °· Do not drink alcohol, especially if you are breastfeeding or taking medication to relieve pain. °· Do not  smoke  tobacco. °· Continue to use good perineal care. Good perineal care includes: °¨ Wiping your perineum from front to back. °¨ Keeping your perineum clean. °· Check your surgical cut (incision) daily for increased redness, drainage, swelling, or separation of skin. °· Shower and clean your incision gently with soap and water every day, by letting warm and soapy water run over the incision, and then pat it dry. If your health care provider says it is okay, leave the incision uncovered. Use a bandage (dressing) if the incision is draining fluid or appears irritated. If the adhesive strips across the incision do not fall off within 7 days, carefully peel them off, after a shower. °· Hug a pillow when coughing or sneezing until your incision is healed. This helps to relieve pain. °· Do not use tampons, douches or have sexual intercourse, until your health care provider says it is okay. °· Wear a well-fitting bra that provides breast support. °· Limit wearing support panties or control-top hose. °· Drink enough fluids to keep your urine clear or pale yellow. °· Eat high-fiber foods such as whole grain cereals and breads, brown rice, beans, and fresh fruits and vegetables every day. These foods may help prevent or relieve constipation. °· Resume activities such as climbing stairs, driving, lifting, exercising, or traveling as directed by your health care provider. °· Try to have someone help you with your household activities and your newborn for at least a few days after you leave the hospital. °· Rest as much as possible. Try to rest or take a nap when your   newborn is sleeping. °· Increase your activities gradually. °· Do not lift more than 15lbs until directed by a provider. °· Keep all of your scheduled postpartum appointments. It is very important to keep your scheduled follow-up appointments. At these appointments, your health care provider will be checking to make sure that you are healing physically and  emotionally. °SEEK MEDICAL CARE IF:  °· You are passing large clots from your vagina. Save any clots to show your health care provider. °· You have a foul smelling discharge from your vagina. °· You have trouble urinating. °· You are urinating frequently. °· You have pain when you urinate. °· You have a change in your bowel movements. °· You have increasing redness, pain, or swelling near your incision. °· You have pus draining from your incision. °· Your incision is separating. °· You have painful, hard, or reddened breasts. °· You have a severe headache. °· You have blurred vision or see spots. °· You feel sad or depressed. °· You have thoughts of hurting yourself or your newborn. °· You have questions about your care, the care of your newborn, or medications. °· You are dizzy or light-headed. °· You have a rash. °· You have pain, redness, or swelling at the site of the removed intravenous access (IV) tube. °· You have nausea or vomiting. °· You stopped breastfeeding and have not had a menstrual period within 12 weeks of stopping. °· You are not breastfeeding and have not had a menstrual period within 12 weeks of delivery. °· You have a fever. °SEEK IMMEDIATE MEDICAL CARE IF: °· You have persistent pain. °· You have chest pain. °· You have shortness of breath. °· You faint. °· You have leg pain. °· You have stomach pain. °· Your vaginal bleeding saturates 2 or more sanitary pads in 1 hour. °MAKE SURE YOU:  °· Understand these instructions. °· Will watch your condition. °· Will get help right away if you are not doing well or get worse. °Document Released: 02/26/2002 Document Revised: 10/21/2013 Document Reviewed: 02/01/2012 °[employer]® Patient Information ©2015 [employer]. This information is not intended to replace advice given to you by your health care provider. Make sure you discuss any questions you have with your health care provider. ° °

## 2015-12-04 ENCOUNTER — Encounter: Payer: Self-pay | Admitting: Obstetrics & Gynecology

## 2015-12-04 ENCOUNTER — Encounter: Payer: Self-pay | Admitting: Obstetrics and Gynecology

## 2015-12-04 ENCOUNTER — Encounter: Payer: Self-pay | Admitting: Nurse Practitioner

## 2016-03-11 ENCOUNTER — Emergency Department
Admission: EM | Admit: 2016-03-11 | Discharge: 2016-03-11 | Disposition: A | Discharge status: Home / Self Care | Payer: No Typology Code available for payment source | Attending: Emergency Medicine | Admitting: Emergency Medicine

## 2016-03-11 DIAGNOSIS — I10 Essential (primary) hypertension: Secondary | ICD-10-CM | POA: Insufficient documentation

## 2016-03-11 DIAGNOSIS — F17210 Nicotine dependence, cigarettes, uncomplicated: Secondary | ICD-10-CM | POA: Diagnosis not present

## 2016-03-11 DIAGNOSIS — S39012A Strain of muscle, fascia and tendon of lower back, initial encounter: Secondary | ICD-10-CM | POA: Insufficient documentation

## 2016-03-11 DIAGNOSIS — Y999 Unspecified external cause status: Secondary | ICD-10-CM | POA: Insufficient documentation

## 2016-03-11 DIAGNOSIS — Y9389 Activity, other specified: Secondary | ICD-10-CM | POA: Insufficient documentation

## 2016-03-11 DIAGNOSIS — M62830 Muscle spasm of back: Secondary | ICD-10-CM

## 2016-03-11 DIAGNOSIS — X501XXA Overexertion from prolonged static or awkward postures, initial encounter: Secondary | ICD-10-CM | POA: Diagnosis not present

## 2016-03-11 DIAGNOSIS — S3992XA Unspecified injury of lower back, initial encounter: Secondary | ICD-10-CM | POA: Diagnosis present

## 2016-03-11 DIAGNOSIS — J45909 Unspecified asthma, uncomplicated: Secondary | ICD-10-CM | POA: Insufficient documentation

## 2016-03-11 DIAGNOSIS — T148XXA Other injury of unspecified body region, initial encounter: Secondary | ICD-10-CM

## 2016-03-11 DIAGNOSIS — Y929 Unspecified place or not applicable: Secondary | ICD-10-CM | POA: Diagnosis not present

## 2016-03-11 MED ORDER — HYDROMORPHONE HCL 1 MG/ML IJ SOLN
1.0000 mg | Freq: Once | INTRAMUSCULAR | Status: AC
  Administered 2016-03-11: 1 mg via INTRAMUSCULAR
  Filled 2016-03-11: qty 1

## 2016-03-11 MED ORDER — BACLOFEN 10 MG PO TABS: 10.0000 mg | ORAL_TABLET | Freq: Three times a day (TID) | ORAL | 0 refills | Status: DC

## 2016-03-11 MED ORDER — DIAZEPAM 5 MG/ML IJ SOLN
5.0000 mg | Freq: Once | INTRAMUSCULAR | Status: AC
  Administered 2016-03-11: 5 mg via INTRAMUSCULAR
  Filled 2016-03-11: qty 2

## 2016-03-11 NOTE — ED Triage Notes (Signed)
Pt reports hx of back issues and yesterday after using a chainsaw to cut up a tree she developed pain to her left lumbar region of her back.

## 2016-03-11 NOTE — ED Provider Notes (Signed)
[facility] Emergency Department Provider Note  ____________________________________________   First MD Initiated Contact with Patient 03/11/16 2211     (approximate)  I have reviewed the triage vital signs and the nursing notes.   HISTORY  Chief Complaint Back Pain   HPI Michelle Kelley is a 30 y.o. female who presents with left lower back pain since yesterday. Pain is 7/10, characterized as a tightness, constant, and worse with movement or standing. Feels better when patient is sitting hunched over. Patient has a history of injury to her lower back about 2 years ago and she saw a chiropractor for that. Yesterday, decided to cut down a tree with a chainsaw yesterday and was bent over holding the chainsaw sideways. Had trouble standing immediately after. Tried taking extra strength tylenol and sleeping it off, without success. Was at a friend's house today and noticed she started feeling weak and tingly in her legs. "It feels like I'm walking on jello." Patient has taken about 18 extra strength tylenol since yesterday, but denies any abdominal pain, nausea or vomiting, diarrhea.    Past Medical History:  Diagnosis Date  . Anxiety   . Asthma   . Complication of anesthesia    I have a hard time coming out of general anesthesia, if I come out of it too fast, I get violent, I have to be eased out of it.  Marland Kitchen. HSV-2 (herpes simplex virus 2) infection   . Hypertension    no meds  . Manic depression (HCC)   . Menorrhagia   . PTSD (post-traumatic stress disorder)   . Schizophrenia, schizo-affective (HCC)   . Vaginal Pap smear, abnormal     Patient Active Problem List   Diagnosis Date Noted  . Postoperative anemia due to acute blood loss 11/29/2015  . Postpartum care following cesarean delivery 11/27/2015  . Marijuana abuse 10/13/2015  . Hx of preeclampsia, prior pregnancy, currently pregnant 10/07/2015  . Schizophrenia (HCC) 10/07/2015  . Manic depressive  disorder (HCC) 10/07/2015  . History of C-section 10/07/2015  . Obesity 10/07/2015  . Tobacco user 10/07/2015  . Substance use disorder 10/07/2015  . Ingrown toenail 09/01/2015  . Episodic mood disorder (HCC) 04/18/2007  . Headache, migraine 04/04/2007    Past Surgical History:  Procedure Laterality Date  . ABDOMINAL SURGERY    . CESAREAN SECTION    . CESAREAN SECTION N/A 11/26/2015   Procedure: CESAREAN SECTION;  Surgeon: Elenora Fenderhelsea C Ward, MD;  Location: [facility] ORS;  Service: Obstetrics;  Laterality: N/A;  . cosmetic repairs     left arm, right knee and back.  Marland Kitchen. LAPAROSCOPIC GASTRIC SLEEVE RESECTION    . PANNICULECTOMY    . TONSILLECTOMY      Prior to Admission medications   Medication Sig Start Date End Date Taking? Authorizing Provider  baclofen (LIORESAL) 10 MG tablet Take 1 tablet (10 mg total) by mouth 3 (three) times daily. 03/11/16   Evangeline Dakinharles M Blaine Hari, PA-C  iron polysaccharides (NIFEREX) 150 MG capsule Take 1 capsule (150 mg total) by mouth daily. 11/29/15   Farrel Connersolleen Gutierrez, CNM  Multiple Vitamin (MULTIVITAMIN) tablet Take 1 tablet by mouth daily.    Historical Provider, MD  oxyCODONE (OXY IR/ROXICODONE) 5 MG immediate release tablet Take one to two tablets every six hours if needed for moderate to severe pain 11/29/15   Farrel Connersolleen Gutierrez, CNM    Allergies Petroleum distillate; Amitriptyline; Eggs or egg-derived products; Latex; Nsaids; Penicillins; and Sulfa antibiotics  Family History  Problem Relation  Age of Onset  . Hypertension Mother   . Mental illness Sister     SCHIZOPHRENIA    Social History Social History  Substance Use Topics  . Smoking status: Current Every Day Smoker    Packs/day: 0.50    Types: Cigarettes  . Smokeless tobacco: Never Used  . Alcohol use No    Review of Systems Constitutional: No fever/chills Cardiovascular: Denies chest pain. Respiratory: Denies shortness of breath. Gastrointestinal: No abdominal pain.  No nausea, no vomiting.  No  diarrhea.   Genitourinary: Negative for dysuria. Musculoskeletal: Positive for low back pain. Skin: Negative for rash. Neurological: Negative for headache. Positive for weakness and tingling in lower extremities.   ____________________________________________   PHYSICAL EXAM:  VITAL SIGNS: ED Triage Vitals  Enc Vitals Group     BP 03/11/16 2200 (!) 149/88     Pulse Rate 03/11/16 2200 100     Resp 03/11/16 2200 20     Temp 03/11/16 2200 98.3 F (36.8 C)     Temp Source 03/11/16 2200 Oral     SpO2 03/11/16 2200 98 %     Weight 03/11/16 2201 165 lb (74.8 kg)     Height 03/11/16 2201 5\' 2"  (1.575 m)     Head Circumference --      Peak Flow --      Pain Score 03/11/16 2201 7     Pain Loc --      Pain Edu? --      Excl. in GC? --     Constitutional: Alert and oriented. Well appearing and in no acute distress. Patient seated slumped over in wheel chair in room.  Eyes: Conjunctivae are normal.  Head: Atraumatic. Neck: No stridor.  No cervical spine tenderness to palpation. Supple, full ROM without pain or difficulty.  Cardiovascular:  Good peripheral circulation. Respiratory: Normal respiratory effort.  No retractions.  Musculoskeletal: Tight paraspinous muscle in left lumbar region, with moderate tenderness to palpation, no swelling or ecchymosis.  Neurologic:  Normal speech and language. Strength limited in left leg, likely due to pain.  Skin:  Skin is warm, dry and intact. No rash noted. Psychiatric: Mood and affect are normal. Speech and behavior are normal.  ____________________________________________   LABS (all labs ordered are listed, but only abnormal results are displayed)  Labs Reviewed - No data to display ____________________________________________  EKG  ____________________________________________  RADIOLOGY  ____________________________________________   PROCEDURES  Procedure(s) performed: None  Procedures  Critical Care performed:  No  ____________________________________________   INITIAL IMPRESSION / ASSESSMENT AND PLAN / ED COURSE  Pertinent labs & imaging results that were available during my care of the patient were reviewed by me and considered in my medical decision making (see chart for details).  Patient presentation consistent with muscle strain and spasm. Given IM dilaudid 1 mg and IM diazepam 5 mg in emergency department with some relief, tolerated well. Given prescription for flexeril for home management of muscle spasm. Referred to orthopedic surgery if symptoms worsen or fail to improve. No other emergency medicine complaints at this time.   Clinical Course     ____________________________________________   FINAL CLINICAL IMPRESSION(S) / ED DIAGNOSES  Final diagnoses:  Muscle strain  Muscle spasm of back      NEW MEDICATIONS STARTED DURING THIS VISIT:  Discharge Medication List as of 03/11/2016 11:03 PM    START taking these medications   Details  baclofen (LIORESAL) 10 MG tablet Take 1 tablet (10 mg total) by mouth 3 (three)  times daily., Starting Fri 03/11/2016, Print         Note:  This document was prepared using Dragon voice recognition software and may include unintentional dictation errors.   Evangeline Dakin, PA-C 03/11/16 1610    Loleta Rose, MD 03/12/16 [phone]

## 2016-09-19 ENCOUNTER — Ambulatory Visit (INDEPENDENT_AMBULATORY_CARE_PROVIDER_SITE_OTHER): Payer: No Typology Code available for payment source | Admitting: Podiatry

## 2016-09-19 ENCOUNTER — Encounter: Payer: Self-pay | Admitting: Podiatry

## 2016-09-19 DIAGNOSIS — L600 Ingrowing nail: Secondary | ICD-10-CM

## 2016-09-19 DIAGNOSIS — M79675 Pain in left toe(s): Secondary | ICD-10-CM

## 2016-09-19 DIAGNOSIS — B351 Tinea unguium: Secondary | ICD-10-CM

## 2016-09-19 NOTE — Progress Notes (Signed)
This patient presents the office with chief complaint of a painful big toenail on the left foot. She said problems with this left great toenail for over 15 years. He says this started when she was 17  with ingrown toenails. She says that when she did have surgery there was a significant infection that occurred in systems formed at the base of both borders of the left great toe He said she is now experiencing pain noted along the inside border the big toe of the left foot due to previous surgery. She says this pain is becoming worse and she presents the office today requesting evaluation and treatment and possible permanent removal of the left great toenail   GENERAL APPEARANCE: Alert, conversant. Appropriately groomed. No acute distress.  VASCULAR: Pedal pulses are  palpable at  [facility] and PT bilateral.  Capillary refill time is immediate to all digits,  Normal temperature gradient.  Digital hair growth is present bilateral  NEUROLOGIC: sensation is normal to 5.07 monofilament at 5/5 sites bilateral.  Light touch is intact bilateral, Muscle strength normal.  MUSCULOSKELETAL: acceptable muscle strength, tone and stability bilateral.  Intrinsic muscluature intact bilateral.  Rectus appearance of foot and digits noted bilateral.   DERMATOLOGIC: skin color, texture, and turgor are within normal limits.  No preulcerative lesions or ulcers  are seen, no interdigital maceration noted.  No open lesions present.  No drainage noted.  NAILS  Thci disfigured ingrown toenail left hallux.  There is pain out of proportion at base medial border left great toenail. No drainage noted.  Onychomycosis left hallux  Ingrown toenail left  ROV  Ingrown toenail left hallux.  Treatment options and alternatives discussed.  Recommended permanent phenol matrixectomy and patient agreed. Left hallux  was prepped with alcohol and a toe block of 3cc of 2% lidocaine plain was administered in a digital toe block. . Care was taken with the  tourniquet since she has latex allergy.  The toe was then prepped with betadine solution .  The offending nail  was then excised and matrix tissue exposed.  Phenol was then applied to the matrix tissue followed by an alcohol wash. Special attention was then to the base of the medial border of the left great toe. This area had developed scar tissue which I resected utilizing a #15 blade. There is one particular area was then phenolized an alcohol placed at the site  Antibiotic ointment and a dry sterile dressing was applied.  The patient was dispensed instructions for aftercare. Due to the excessive work at resecting the scar tissue. This patient bled more than usual and she was kept in the treatment chair an additional 15-20 minutes while we waited for the bleeding to subside. She is to take acetaminophen. If pain occurs and to return to the office in one week for evaluation and treatment     Helane Gunther DPM

## 2016-09-26 ENCOUNTER — Ambulatory Visit (INDEPENDENT_AMBULATORY_CARE_PROVIDER_SITE_OTHER): Payer: No Typology Code available for payment source | Admitting: Podiatry

## 2016-09-26 DIAGNOSIS — Z09 Encounter for follow-up examination after completed treatment for conditions other than malignant neoplasm: Secondary | ICD-10-CM

## 2016-09-26 NOTE — Progress Notes (Signed)
This patient returns to the office following nail surgery one week ago.  The patient says toe has been soaked and bandaged as directed.  There has been improvement of the toe since the surgery has been performed. The patient presents for continued evaluation and treatment.  GENERAL APPEARANCE: Alert, conversant. Appropriately groomed. No acute distress.  VASCULAR: Pedal pulses palpable at  DP and PT bilateral.  Capillary refill time is immediate to all digits,  Normal temperature gradient.    NEUROLOGIC: sensation is normal to 5.07 monofilament at 5/5 sites bilateral.  Light touch is intact bilateral, Muscle strength normal.  MUSCULOSKELETAL: acceptable muscle strength, tone and stability bilateral.  Intrinsic muscluature intact bilateral.  Rectus appearance of foot and digits noted bilateral.   DERMATOLOGIC: skin color, texture, and turgor are within normal limits.  No preulcerative lesions or ulcers  are seen, no interdigital maceration noted.   NAILS  There is necrotic tissue along the nail groove  In the absence of redness swelling and pain.  DX  S/p nail surgery  ROV  Home instructions were discussed.  Patient to call the office if there are any questions or concerns.   Michelle Kelley DPM   

## 2017-02-25 ENCOUNTER — Emergency Department
Admission: EM | Admit: 2017-02-25 | Discharge: 2017-02-25 | Disposition: A | Discharge status: Home / Self Care | Payer: No Typology Code available for payment source | Attending: Emergency Medicine | Admitting: Emergency Medicine

## 2017-02-25 ENCOUNTER — Encounter: Payer: Self-pay | Admitting: Emergency Medicine

## 2017-02-25 DIAGNOSIS — Z91040 Latex allergy status: Secondary | ICD-10-CM | POA: Diagnosis not present

## 2017-02-25 DIAGNOSIS — I10 Essential (primary) hypertension: Secondary | ICD-10-CM | POA: Diagnosis not present

## 2017-02-25 DIAGNOSIS — Z79899 Other long term (current) drug therapy: Secondary | ICD-10-CM | POA: Diagnosis not present

## 2017-02-25 DIAGNOSIS — M545 Low back pain, unspecified: Secondary | ICD-10-CM

## 2017-02-25 DIAGNOSIS — J45909 Unspecified asthma, uncomplicated: Secondary | ICD-10-CM | POA: Diagnosis not present

## 2017-02-25 DIAGNOSIS — Z87891 Personal history of nicotine dependence: Secondary | ICD-10-CM | POA: Diagnosis not present

## 2017-02-25 MED ORDER — LIDOCAINE 5 % EX PTCH: 1.0000 | MEDICATED_PATCH | Freq: Two times a day (BID) | CUTANEOUS | 0 refills | Status: AC

## 2017-02-25 MED ORDER — ORPHENADRINE CITRATE 30 MG/ML IJ SOLN
60.0000 mg | Freq: Two times a day (BID) | INTRAMUSCULAR | Status: DC
  Administered 2017-02-25: 60 mg via INTRAMUSCULAR
  Filled 2017-02-25: qty 2

## 2017-02-25 MED ORDER — KETOROLAC TROMETHAMINE 30 MG/ML IJ SOLN
30.0000 mg | Freq: Once | INTRAMUSCULAR | Status: AC
  Administered 2017-02-25: 30 mg via INTRAMUSCULAR
  Filled 2017-02-25: qty 1

## 2017-02-25 MED ORDER — CYCLOBENZAPRINE HCL 10 MG PO TABS: 10.0000 mg | ORAL_TABLET | Freq: Three times a day (TID) | ORAL | 0 refills | Status: AC | PRN

## 2017-02-25 NOTE — ED Provider Notes (Signed)
[facility] Emergency Department Provider Note  ____________________________________________  Time seen: Approximately 9:44 PM  I have reviewed the triage vital signs and the nursing notes.   HISTORY  Chief Complaint Back Pain    HPI Michelle Kelley is a 31 y.o. female presenting to the emergency department with an exacerbation of chronic low back pain. Patient currently rates her pain at 10 out of 10 in intensity. Patient states that she was cooking this evening and felt her back spasm. Patient denies falls or mechanisms of trauma. She denies dysuria, hematuria and increased urinary frequency. Patient denies weakness or changes in sensation in the lower extremities. No bowel or bladder incontinence. No alleviating measures have been attempted. Patient states that she cannot take oral NSAIDs due to gastric sleeve.   Past Medical History:  Diagnosis Date  . Anxiety   . Asthma   . Complication of anesthesia    I have a hard time coming out of general anesthesia, if I come out of it too fast, I get violent, I have to be eased out of it.  Marland Kitchen HSV-2 (herpes simplex virus 2) infection   . Hypertension    no meds  . Manic depression (HCC)   . Menorrhagia   . PTSD (post-traumatic stress disorder)   . Schizophrenia, schizo-affective (HCC)   . Vaginal Pap smear, abnormal     Patient Active Problem List   Diagnosis Date Noted  . Postoperative anemia due to acute blood loss 11/29/2015  . Postpartum care following cesarean delivery 11/27/2015  . Marijuana abuse 10/13/2015  . Hx of preeclampsia, prior pregnancy, currently pregnant 10/07/2015  . Schizophrenia (HCC) 10/07/2015  . Manic depressive disorder (HCC) 10/07/2015  . History of C-section 10/07/2015  . Obesity 10/07/2015  . Tobacco user 10/07/2015  . Substance use disorder 10/07/2015  . Ingrown toenail 09/01/2015  . Episodic mood disorder (HCC) 04/18/2007  . Headache, migraine 04/04/2007    Past  Surgical History:  Procedure Laterality Date  . ABDOMINAL SURGERY    . CESAREAN SECTION    . CESAREAN SECTION N/A 11/26/2015   Procedure: CESAREAN SECTION;  Surgeon: Elenora Fender Ward, MD;  Location: [facility] ORS;  Service: Obstetrics;  Laterality: N/A;  . cosmetic repairs     left arm, right knee and back.  Marland Kitchen LAPAROSCOPIC GASTRIC SLEEVE RESECTION    . PANNICULECTOMY    . TONSILLECTOMY      Prior to Admission medications   Medication Sig Start Date End Date Taking? Authorizing Provider  baclofen (LIORESAL) 10 MG tablet Take 1 tablet (10 mg total) by mouth 3 (three) times daily. 03/11/16   Beers, Charmayne Sheer, PA-C  cyclobenzaprine (FLEXERIL) 10 MG tablet Take 1 tablet (10 mg total) by mouth 3 (three) times daily as needed for muscle spasms. 02/25/17 03/02/17  Orvil Feil, PA-C  iron polysaccharides (NIFEREX) 150 MG capsule Take 1 capsule (150 mg total) by mouth daily. 11/29/15   Farrel Conners, CNM  lidocaine (LIDODERM) 5 % Place 1 patch onto the skin every 12 (twelve) hours. Remove & Discard patch within 12 hours or as directed by MD 02/25/17 03/02/17  Orvil Feil, PA-C  Multiple Vitamin (MULTIVITAMIN) tablet Take 1 tablet by mouth daily.    [provider]  oxyCODONE (OXY IR/ROXICODONE) 5 MG immediate release tablet Take one to two tablets every six hours if needed for moderate to severe pain 11/29/15   Farrel Conners, CNM    Allergies Petroleum distillate; Amitriptyline; Eggs or egg-derived products; Latex;  Nsaids; Penicillins; and Sulfa antibiotics  Family History  Problem Relation Age of Onset  . Hypertension Mother   . Mental illness Sister        SCHIZOPHRENIA    Social History Social History  Substance Use Topics  . Smoking status: Former Smoker    Packs/day: 0.50    Types: Cigarettes  . Smokeless tobacco: Never Used  . Alcohol use No     Review of Systems  Constitutional: No fever/chills Eyes: No visual changes. No discharge ENT: No upper respiratory  complaints. Cardiovascular: no chest pain. Respiratory: no cough. No SOB. Musculoskeletal: Patient has low back pain.  Skin: Negative for rash, abrasions, lacerations, ecchymosis. Neurological: Negative for headaches, focal weakness or numbness.   ____________________________________________   PHYSICAL EXAM:  VITAL SIGNS: ED Triage Vitals  Enc Vitals Group     BP 02/25/17 2132 (!) 163/96     Pulse Rate 02/25/17 2131 94     Resp 02/25/17 2131 18     Temp 02/25/17 2131 98.1 F (36.7 C)     Temp src --      SpO2 02/25/17 2131 100 %     Weight 02/25/17 2131 150 lb (68 kg)     Height 02/25/17 2131  (1.575 m)     Head Circumference --      Peak Flow --      Pain Score 02/25/17 2131 7     Pain Loc --      Pain Edu? --      Excl. in GC? --      Constitutional: Alert and oriented. Well appearing and in no acute distress. Eyes: Conjunctivae are normal. PERRL. EOMI. Head: Atraumatic. Cardiovascular: Normal rate, regular rhythm. Normal S1 and S2.  Good peripheral circulation. Respiratory: Normal respiratory effort without tachypnea or retractions. Lungs CTAB. Good air entry to the bases with no decreased or absent breath sounds. Musculoskeletal: Patient has 5/5 strength in the upper and lower extremities bilaterally. Full range of motion at the shoulder, elbow and wrist bilaterally. Full range of motion at the hip, knee and ankle bilaterally. No changes in gait.   Neurologic:  Normal speech and language. No gross focal neurologic deficits are appreciated. Straight leg raise bilaterally. No midline spinal tenderness. Skin:  Skin is warm, dry and intact. No rash noted. Psychiatric: Mood and affect are normal. Speech and behavior are normal. Patient exhibits appropriate insight and judgement.   ____________________________________________   LABS (all labs ordered are listed, but only abnormal results are displayed)  Labs Reviewed - No data to  display ____________________________________________  EKG   ____________________________________________  RADIOLOGY   No results found.  ____________________________________________    PROCEDURES  Procedure(s) performed:    Procedures    Medications  orphenadrine (NORFLEX) injection 60 mg (not administered)  ketorolac (TORADOL) 30 MG/ML injection 30 mg (not administered)     ____________________________________________   INITIAL IMPRESSION / ASSESSMENT AND PLAN / ED COURSE  Pertinent labs & imaging results that were available during my care of the patient were reviewed by me and considered in my medical decision making (see chart for details).  Review of the [place] CSRS was performed in accordance of the [facility] prior to dispensing any controlled drugs.    Assessment and plan Low back pain Patient presents to the emergency department with an exacerbation of chronic low back pain. X-ray examination is not warranted at this time. Patient was given Norflex and Toradol in the emergency department. She was discharged with Flexeril  and lidocaine patches. Patient states that she wishes to avoid oral NSAIDs due to gastric sleeve. Patient was advised to follow-up with primary care as needed. All patient questions were answered.     ____________________________________________  FINAL CLINICAL IMPRESSION(S) / ED DIAGNOSES  Final diagnoses:  Acute bilateral low back pain without sciatica      NEW MEDICATIONS STARTED DURING THIS VISIT:  New Prescriptions   CYCLOBENZAPRINE (FLEXERIL) 10 MG TABLET    Take 1 tablet (10 mg total) by mouth 3 (three) times daily as needed for muscle spasms.   LIDOCAINE (LIDODERM) 5 %    Place 1 patch onto the skin every 12 (twelve) hours. Remove & Discard patch within 12 hours or as directed by MD        This chart was dictated using voice recognition software/Dragon. Despite best efforts to proofread, errors can occur which can change the  meaning. Any change was purely unintentional.    Orvil FeilWoods, Fynlee Rowlands M, PA-C 02/25/17 2220    Phineas SemenGoodman, [place], MD 02/25/17 2231

## 2017-02-25 NOTE — ED Triage Notes (Signed)
Patient states that she thinks she pulled a muscle in her back. Patient states that she was cooking a couple hours ago and started having back pain. Patient states that she took tylenol but it has not helped.

## 2017-06-20 ENCOUNTER — Emergency Department: Payer: No Typology Code available for payment source

## 2017-06-20 ENCOUNTER — Other Ambulatory Visit: Payer: Self-pay

## 2017-06-20 DIAGNOSIS — Y998 Other external cause status: Secondary | ICD-10-CM | POA: Insufficient documentation

## 2017-06-20 DIAGNOSIS — S63502A Unspecified sprain of left wrist, initial encounter: Secondary | ICD-10-CM | POA: Insufficient documentation

## 2017-06-20 DIAGNOSIS — Z87891 Personal history of nicotine dependence: Secondary | ICD-10-CM | POA: Diagnosis not present

## 2017-06-20 DIAGNOSIS — J45909 Unspecified asthma, uncomplicated: Secondary | ICD-10-CM | POA: Insufficient documentation

## 2017-06-20 DIAGNOSIS — Y929 Unspecified place or not applicable: Secondary | ICD-10-CM | POA: Diagnosis not present

## 2017-06-20 DIAGNOSIS — S6992XA Unspecified injury of left wrist, hand and finger(s), initial encounter: Secondary | ICD-10-CM | POA: Diagnosis present

## 2017-06-20 DIAGNOSIS — I10 Essential (primary) hypertension: Secondary | ICD-10-CM | POA: Diagnosis not present

## 2017-06-20 DIAGNOSIS — W19XXXA Unspecified fall, initial encounter: Secondary | ICD-10-CM | POA: Diagnosis not present

## 2017-06-20 DIAGNOSIS — Z79899 Other long term (current) drug therapy: Secondary | ICD-10-CM | POA: Insufficient documentation

## 2017-06-20 DIAGNOSIS — Y939 Activity, unspecified: Secondary | ICD-10-CM | POA: Diagnosis not present

## 2017-06-20 NOTE — ED Notes (Signed)
Pt sitting in flex wait with no distress noted; updated on wait time & delay

## 2017-06-20 NOTE — ED Triage Notes (Signed)
Pt injured left wrist last night after falling, and fell again this am.

## 2017-06-21 ENCOUNTER — Emergency Department
Admission: EM | Admit: 2017-06-21 | Discharge: 2017-06-21 | Disposition: A | Discharge status: Home / Self Care | Payer: No Typology Code available for payment source | Attending: Emergency Medicine | Admitting: Emergency Medicine

## 2017-06-21 DIAGNOSIS — S63502A Unspecified sprain of left wrist, initial encounter: Secondary | ICD-10-CM | POA: Diagnosis not present

## 2017-06-21 MED ORDER — OXYCODONE-ACETAMINOPHEN 5-325 MG PO TABS
1.0000 | ORAL_TABLET | Freq: Once | ORAL | Status: AC
  Administered 2017-06-21: 1 via ORAL
  Filled 2017-06-21: qty 1

## 2017-06-21 MED ORDER — OXYCODONE-ACETAMINOPHEN 5-325 MG PO TABS: 1.0000 | ORAL_TABLET | ORAL | 0 refills | Status: DC | PRN

## 2017-06-21 NOTE — ED Notes (Signed)
Wrist splint and sling applied

## 2017-06-21 NOTE — ED Notes (Addendum)
Pt to the er for pain to the left wrist related to a fall last night. Pt attempted to pop her wrist at home and did not help. Pt reports swelling to hand and wrist. Decreased ROM. Pain is posterior and anterior.

## 2017-06-21 NOTE — ED Provider Notes (Signed)
[facility] Emergency Department Provider Note    First MD Initiated Contact with Patient 06/21/17 [phone]     (approximate)  I have reviewed the triage vital signs and the nursing notes.   HISTORY  Chief Complaint Wrist Pain    HPI Michelle Kelley is a 32 y.o. female list of chronic medical conditions presents to the emergency department history of accidental fall which occurred last night with resultant left wrist pain current pain score 4 out of 10.  Pain worse with any movement of the left wrist.  Patient denies any alleviating factors stating that she placed a Lidoderm patch with minimal relief.   Past Medical History:  Diagnosis Date  . Anxiety   . Asthma   . Complication of anesthesia    I have a hard time coming out of general anesthesia, if I come out of it too fast, I get violent, I have to be eased out of it.  Marland Kitchen HSV-2 (herpes simplex virus 2) infection   . Hypertension    no meds  . Manic depression (HCC)   . Menorrhagia   . PTSD (post-traumatic stress disorder)   . Schizophrenia, schizo-affective (HCC)   . Vaginal Pap smear, abnormal     Patient Active Problem List   Diagnosis Date Noted  . Postoperative anemia due to acute blood loss 11/29/2015  . Postpartum care following cesarean delivery 11/27/2015  . Marijuana abuse 10/13/2015  . Hx of preeclampsia, prior pregnancy, currently pregnant 10/07/2015  . Schizophrenia (HCC) 10/07/2015  . Manic depressive disorder (HCC) 10/07/2015  . History of C-section 10/07/2015  . Obesity 10/07/2015  . Tobacco user 10/07/2015  . Substance use disorder 10/07/2015  . Ingrown toenail 09/01/2015  . Episodic mood disorder (HCC) 04/18/2007  . Headache, migraine 04/04/2007    Past Surgical History:  Procedure Laterality Date  . ABDOMINAL SURGERY    . CESAREAN SECTION    . CESAREAN SECTION N/A 11/26/2015   Procedure: CESAREAN SECTION;  Surgeon: Elenora Fender Ward, MD;  Location: [facility] ORS;  Service:  Obstetrics;  Laterality: N/A;  . cosmetic repairs     left arm, right knee and back.  Marland Kitchen LAPAROSCOPIC GASTRIC SLEEVE RESECTION    . PANNICULECTOMY    . TONSILLECTOMY      Prior to Admission medications   Medication Sig Start Date End Date Taking? Authorizing Provider  baclofen (LIORESAL) 10 MG tablet Take 1 tablet (10 mg total) by mouth 3 (three) times daily. 03/11/16   Beers, Charmayne Sheer, PA-C  iron polysaccharides (NIFEREX) 150 MG capsule Take 1 capsule (150 mg total) by mouth daily. 11/29/15   Farrel Conners, CNM  Multiple Vitamin (MULTIVITAMIN) tablet Take 1 tablet by mouth daily.    [provider]  oxyCODONE (OXY IR/ROXICODONE) 5 MG immediate release tablet Take one to two tablets every six hours if needed for moderate to severe pain 11/29/15   Farrel Conners, CNM    Allergies Petroleum distillate; Amitriptyline; Eggs or egg-derived products; Latex; Nsaids; Penicillins; and Sulfa antibiotics  Family History  Problem Relation Age of Onset  . Hypertension Mother   . Mental illness Sister        SCHIZOPHRENIA    Social History Social History   Tobacco Use  . Smoking status: Former Smoker    Packs/day: 0.50    Types: Cigarettes  . Smokeless tobacco: Never Used  Substance Use Topics  . Alcohol use: No  . Drug use: Yes    Types: Marijuana  Comment: Molly. Last used today. last use 11/24/15.    Review of Systems Constitutional: No fever/chills Eyes: No visual changes. ENT: No sore throat. Cardiovascular: Denies chest pain. Respiratory: Denies shortness of breath. Gastrointestinal: No abdominal pain.  No nausea, no vomiting.  No diarrhea.  No constipation. Genitourinary: Negative for dysuria. Musculoskeletal: Negative for neck pain.  Negative for back pain. Integumentary: Negative for rash. Neurological: Negative for headaches, focal weakness or numbness.   ____________________________________________   PHYSICAL EXAM:  VITAL SIGNS: ED Triage Vitals   Enc Vitals Group     BP 06/20/17 2213 (!) 159/89     Pulse Rate 06/20/17 2213 90     Resp 06/20/17 2213 18     Temp 06/20/17 2213 98.8 F (37.1 C)     Temp Source 06/20/17 2213 Oral     SpO2 06/20/17 2213 100 %     Weight 06/20/17 2213 70.3 kg (155 lb)     Height 06/20/17 2213 1.575 m (5\' 2" )     Head Circumference --      Peak Flow --      Pain Score 06/20/17 2212 4     Pain Loc --      Pain Edu? --      Excl. in GC? --     Constitutional: Alert and oriented. Well appearing and in no acute distress. Eyes: Conjunctivae are normal.  Head: Atraumatic. Mouth/Throat: Mucous membranes are moist. Oropharynx non-erythematous. Neck: No stridor.  Musculoskeletal: Left wrist pain with active and passive range of motion.  Pain with palpation. Neurologic:  Normal speech and language. No gross focal neurologic deficits are appreciated.  Skin:  Skin is warm, dry and intact. No rash noted. Psychiatric: Mood and affect are normal. Speech and behavior are normal.  _  RADIOLOGY I, [place] Namya Voges, personally viewed and evaluated these images (plain radiographs) as part of my medical decision making, as well as reviewing the written report by the radiologist.  Dg Wrist Complete Left  Result Date: 06/20/2017 CLINICAL DATA:  Patient fell last night and again this morning with injury to the left wrist. EXAM: LEFT WRIST - COMPLETE 3+ VIEW COMPARISON:  None. FINDINGS: There is no evidence of fracture or dislocation. There is no evidence of arthropathy or other focal bone abnormality. Soft tissues are unremarkable. IMPRESSION: Negative. Electronically Signed   By: Burman NievesWilliam  Stevens M.D.   On: 06/20/2017 22:38     Procedures   ____________________________________________   INITIAL IMPRESSION / ASSESSMENT AND PLAN / ED COURSE  As part of my medical decision making, I reviewed the following data within the electronic MEDICAL RECORD NUMBERPhysical exam concerning for possible wrist fracture versus  dislocation versus sprain.  X-ray revealed no evidence of fracture or dislocation.  Left wrist splint applied Percocet given ____________________________________________  FINAL CLINICAL IMPRESSION(S) / ED DIAGNOSES  Final diagnoses:  Sprain of left wrist, initial encounter     MEDICATIONS GIVEN DURING THIS VISIT:  Medications  oxyCODONE-acetaminophen (PERCOCET/ROXICET) 5-325 MG per tablet 1 tablet (not administered)     ED Discharge Orders    None       Note:  This document was prepared using Dragon voice recognition software and may include unintentional dictation errors.    Darci CurrentBrown, [place] N, MD 06/21/17 [phone]

## 2019-04-21 NOTE — Progress Notes (Deleted)
System, Pcp Not In   No chief complaint on file.    HPI:      [name] is a 33 y.o. [phone] who LMP was No LMP recorded., presents today for NP > 3 yrs eval of irregular bleeding  Last pap 08/25/14. No abnormalities, no HPV DNA done.  Patient Active Problem List   Diagnosis Date Noted  . Postoperative anemia due to acute blood loss 11/29/2015  . Postpartum care following cesarean delivery 11/27/2015  . Marijuana abuse 10/13/2015  . Hx of preeclampsia, prior pregnancy, currently pregnant 10/07/2015  . Schizophrenia (HCC) 10/07/2015  . Manic depressive disorder (HCC) 10/07/2015  . History of C-section 10/07/2015  . Obesity 10/07/2015  . Tobacco user 10/07/2015  . Substance use disorder 10/07/2015  . Ingrown toenail 09/01/2015  . Episodic mood disorder (HCC) 04/18/2007  . Headache, migraine 04/04/2007    Past Surgical History:  Procedure Laterality Date  . ABDOMINAL SURGERY    . CESAREAN SECTION    . CESAREAN SECTION N/A 11/26/2015   Procedure: CESAREAN SECTION;  Surgeon: Elenora Fender Ward, MD;  Location: [facility] ORS;  Service: Obstetrics;  Laterality: N/A;  . cosmetic repairs     left arm, right knee and back.  Marland Kitchen LAPAROSCOPIC GASTRIC SLEEVE RESECTION    . PANNICULECTOMY    . TONSILLECTOMY      Family History  Problem Relation Age of Onset  . Hypertension Mother   . Mental illness Sister        SCHIZOPHRENIA    Social History   Socioeconomic History  . Marital status: Married    Spouse name: Not on file  . Number of children: Not on file  . Years of education: Not on file  . Highest education level: Not on file  Occupational History  . Not on file  Social Needs  . Financial resource strain: Not on file  . Food insecurity    Worry: Not on file    Inability: Not on file  . Transportation needs    Medical: Not on file    Non-medical: Not on file  Tobacco Use  . Smoking status: Former Smoker    Packs/day: 0.50    Types: Cigarettes  . Smokeless  tobacco: Never Used  Substance and Sexual Activity  . Alcohol use: No  . Drug use: Yes    Types: Marijuana    Comment: Molly. Last used today. last use 11/24/15.  Marland Kitchen Sexual activity: Not on file  Lifestyle  . Physical activity    Days per week: Not on file    Minutes per session: Not on file  . Stress: Not on file  Relationships  . Social Musician on phone: Not on file    Gets together: Not on file    Attends religious service: Not on file    Active member of club or organization: Not on file    Attends meetings of clubs or organizations: Not on file    Relationship status: Not on file  . Intimate partner violence    Fear of current or ex partner: Not on file    Emotionally abused: Not on file    Physically abused: Not on file    Forced sexual activity: Not on file  Other Topics Concern  . Not on file  Social History Narrative  . Not on file    Outpatient Medications Prior to Visit  Medication Sig Dispense Refill  . baclofen (LIORESAL) 10 MG tablet  Take 1 tablet (10 mg total) by mouth 3 (three) times daily. 30 tablet 0  . iron polysaccharides (NIFEREX) 150 MG capsule Take 1 capsule (150 mg total) by mouth daily. 30 capsule 1  . Multiple Vitamin (MULTIVITAMIN) tablet Take 1 tablet by mouth daily.    Marland Kitchen oxyCODONE (OXY IR/ROXICODONE) 5 MG immediate release tablet Take one to two tablets every six hours if needed for moderate to severe pain 30 tablet 0  . oxyCODONE-acetaminophen (ROXICET) 5-325 MG tablet Take 1 tablet by mouth every 4 (four) hours as needed for severe pain. 12 tablet 0   No facility-administered medications prior to visit.       ROS:  Review of Systems BREAST: No symptoms   OBJECTIVE:   Vitals:  There were no vitals taken for this visit.  Physical Exam  Results: No results found for this or any previous visit (from the past 24 hour(s)).   Assessment/Plan: No diagnosis found.    No orders of the defined types were placed in this  encounter.     No follow-ups on file.  Alicia B. Copland, PA-C 04/21/2019 7:36 PM

## 2019-04-22 ENCOUNTER — Ambulatory Visit: Payer: No Typology Code available for payment source | Admitting: Obstetrics and Gynecology

## 2019-06-26 ENCOUNTER — Other Ambulatory Visit: Payer: Self-pay

## 2019-06-26 ENCOUNTER — Ambulatory Visit
Admission: EM | Admit: 2019-06-26 | Discharge: 2019-06-26 | Disposition: A | Discharge status: Home / Self Care | Payer: No Typology Code available for payment source | Attending: Emergency Medicine | Admitting: Emergency Medicine

## 2019-06-26 DIAGNOSIS — N764 Abscess of vulva: Secondary | ICD-10-CM

## 2019-06-26 DIAGNOSIS — R030 Elevated blood-pressure reading, without diagnosis of hypertension: Secondary | ICD-10-CM

## 2019-06-26 MED ORDER — DOXYCYCLINE HYCLATE 100 MG PO CAPS: 100.0000 mg | ORAL_CAPSULE | Freq: Two times a day (BID) | ORAL | 0 refills | Status: DC

## 2019-06-26 NOTE — ED Triage Notes (Signed)
Pt presents with abscess on left and right side of vaginal area for about a week; pt states the area on the right is draining.

## 2019-06-26 NOTE — Discharge Instructions (Addendum)
Take the antibiotic as directed.  Keep your wound clean and dry.  Wash it gently twice a day with soap and water.      Follow up with your primary care provider or return here if you see signs of worsening infection, such as increased pain, redness, warmth, fever, chills, or other concerning symptoms.    Your blood pressure is elevated today at 153/102.  Please have this rechecked by your primary care provider in 2-4 weeks.

## 2019-06-26 NOTE — ED Provider Notes (Signed)
Michelle Kelley    CSN: [id number] Arrival date & time: 06/26/19  1224      History   Chief Complaint Chief Complaint  Patient presents with  . Abscess on Vaginal Area    HPI Michelle Kelley is a 34 y.o. female.   Patient presents with a "ingrown hair" on her labia x1 week.  She reports it "came to a head" and started draining purulent discharge 2 days ago.  She also reports a second firm tender area on the other side of her labia x2 days.  She denies fever, chills, abdominal pain, dysuria, back pain, vaginal discharge, pelvic pain, or other symptoms.    The history is provided by the patient.    Past Medical History:  Diagnosis Date  . Anxiety   . Asthma   . Complication of anesthesia    I have a hard time coming out of general anesthesia, if I come out of it too fast, I get violent, I have to be eased out of it.  Marland Kitchen HSV-2 (herpes simplex virus 2) infection   . Hypertension    no meds  . Manic depression (HCC)   . Menorrhagia   . PTSD (post-traumatic stress disorder)   . Schizophrenia, schizo-affective (HCC)   . Vaginal Pap smear, abnormal     Patient Active Problem List   Diagnosis Date Noted  . Postoperative anemia due to acute blood loss 11/29/2015  . Postpartum care following cesarean delivery 11/27/2015  . Marijuana abuse 10/13/2015  . Hx of preeclampsia, prior pregnancy, currently pregnant 10/07/2015  . Schizophrenia (HCC) 10/07/2015  . Manic depressive disorder (HCC) 10/07/2015  . History of C-section 10/07/2015  . Obesity 10/07/2015  . Tobacco user 10/07/2015  . Substance use disorder 10/07/2015  . Ingrown toenail 09/01/2015  . Episodic mood disorder (HCC) 04/18/2007  . Headache, migraine 04/04/2007    Past Surgical History:  Procedure Laterality Date  . ABDOMINAL SURGERY    . CESAREAN SECTION    . CESAREAN SECTION N/A 11/26/2015   Procedure: CESAREAN SECTION;  Surgeon: Elenora Fender Ward, MD;  Location: [facility] ORS;  Service: Obstetrics;  Laterality:  N/A;  . cosmetic repairs     left arm, right knee and back.  Marland Kitchen LAPAROSCOPIC GASTRIC SLEEVE RESECTION    . PANNICULECTOMY    . TONSILLECTOMY      OB History    Gravida  3   Para  3   Term  2   Preterm  1   AB      Living  3     SAB      TAB      Ectopic      Multiple  0   Live Births  3            Home Medications    Prior to Admission medications   Medication Sig Start Date End Date Taking? Authorizing Provider  baclofen (LIORESAL) 10 MG tablet Take 1 tablet (10 mg total) by mouth 3 (three) times daily. 03/11/16   Beers, Charmayne Sheer, PA-C  doxycycline (VIBRAMYCIN) 100 MG capsule Take 1 capsule (100 mg total) by mouth 2 (two) times daily. 06/26/19   Mickie Bail, NP  iron polysaccharides (NIFEREX) 150 MG capsule Take 1 capsule (150 mg total) by mouth daily. 11/29/15   Farrel Conners, CNM  Multiple Vitamin (MULTIVITAMIN) tablet Take 1 tablet by mouth daily.    [provider]  oxyCODONE (OXY IR/ROXICODONE) 5 MG immediate release tablet Take one  to two tablets every six hours if needed for moderate to severe pain 11/29/15   Farrel Conners, CNM  oxyCODONE-acetaminophen (ROXICET) 5-325 MG tablet Take 1 tablet by mouth every 4 (four) hours as needed for severe pain. 06/21/17   Darci Current, MD    Family History Family History  Problem Relation Age of Onset  . Hypertension Mother   . Mental illness Sister        SCHIZOPHRENIA    Social History Social History   Tobacco Use  . Smoking status: Former Smoker    Packs/day: 0.50    Types: Cigarettes  . Smokeless tobacco: Never Used  Substance Use Topics  . Alcohol use: No  . Drug use: Yes    Types: Marijuana    Comment: Molly. Last used today. last use 11/24/15.     Allergies   Petroleum distillate, Amitriptyline, Eggs or egg-derived products, Latex, Nsaids, Penicillins, and Sulfa antibiotics   Review of Systems Review of Systems  Constitutional: Negative for chills and fever.  HENT:  Negative for ear pain and sore throat.   Eyes: Negative for pain and visual disturbance.  Respiratory: Negative for cough and shortness of breath.   Cardiovascular: Negative for chest pain and palpitations.  Gastrointestinal: Negative for abdominal pain, diarrhea, nausea and vomiting.  Genitourinary: Negative for dysuria and hematuria.  Musculoskeletal: Negative for arthralgias and back pain.  Skin: Positive for wound. Negative for color change and rash.  Neurological: Negative for seizures and syncope.  All other systems reviewed and are negative.    Physical Exam Triage Vital Signs ED Triage Vitals  Enc Vitals Group     BP      Pulse      Resp      Temp      Temp src      SpO2      Weight      Height      Head Circumference      Peak Flow      Pain Score      Pain Loc      Pain Edu?      Excl. in GC?    No data found.  Updated Vital Signs BP (!) 153/102 (BP Location: Left Arm)   Pulse (!) 108   Temp 98.1 F (36.7 C) (Oral)   Resp 18   LMP 05/10/2019   SpO2 98%   Visual Acuity Right Eye Distance:   Left Eye Distance:   Bilateral Distance:    Right Eye Near:   Left Eye Near:    Bilateral Near:     Physical Exam Vitals and nursing note reviewed.  Constitutional:      General: She is not in acute distress.    Appearance: She is well-developed.  HENT:     Head: Normocephalic and atraumatic.     Mouth/Throat:     Mouth: Mucous membranes are moist.  Eyes:     Conjunctiva/sclera: Conjunctivae normal.  Cardiovascular:     Rate and Rhythm: Normal rate and regular rhythm.     Heart sounds: No murmur.  Pulmonary:     Effort: Pulmonary effort is normal. No respiratory distress.     Breath sounds: Normal breath sounds.  Abdominal:     General: Bowel sounds are normal.     Palpations: Abdomen is soft.     Tenderness: There is no abdominal tenderness. There is no right CVA tenderness, left CVA tenderness, guarding or rebound.  Genitourinary:    Comments:  Right labia: ruptured abscess, no drainage, mild localized erythema. Left labia: firm, tender, nonfluctuant, flesh-colored 2 cm area. Musculoskeletal:     Cervical back: Neck supple.  Skin:    General: Skin is warm and dry.  Neurological:     General: No focal deficit present.     Mental Status: She is alert and oriented to person, place, and time.  Psychiatric:        Mood and Affect: Mood normal.        Behavior: Behavior normal.      UC Treatments / Results  Labs (all labs ordered are listed, but only abnormal results are displayed) Labs Reviewed - No data to display  EKG   Radiology No results found.  Procedures Procedures (including critical care time)  Medications Ordered in UC Medications - No data to display  Initial Impression / Assessment and Plan / UC Course  I have reviewed the triage vital signs and the nursing notes.  Pertinent labs & imaging results that were available during my care of the patient were reviewed by me and considered in my medical decision making (see chart for details).    Labial abscess.  Elevated blood pressure reading.  Treating with doxycycline (patient has multiple allergies).  Wound care instructions and signs of worsening infection discussed with patient.  Instructed her to follow-up with her PCP or return here if she notes signs of worsening infection.  Discussed with patient that her blood pressure is elevated today needs to be rechecked by her PCP in 2 to 4 weeks.  Patient agrees to plan of care.     Final Clinical Impressions(s) / UC Diagnoses   Final diagnoses:  Abscess of genital labia  Elevated blood pressure reading     Discharge Instructions     Take the antibiotic as directed.  Keep your wound clean and dry.  Wash it gently twice a day with soap and water.      Follow up with your primary care provider or return here if you see signs of worsening infection, such as increased pain, redness, warmth, fever, chills, or  other concerning symptoms.    Your blood pressure is elevated today at 153/102.  Please have this rechecked by your primary care provider in 2-4 weeks.          ED Prescriptions    Medication Sig Dispense Auth. Provider   doxycycline (VIBRAMYCIN) 100 MG capsule Take 1 capsule (100 mg total) by mouth 2 (two) times daily. 20 capsule Sharion Balloon, NP     PDMP not reviewed this encounter.   Sharion Balloon, NP 06/26/19 1313

## 2019-06-27 ENCOUNTER — Encounter: Payer: No Typology Code available for payment source | Admitting: Obstetrics and Gynecology

## 2020-02-05 ENCOUNTER — Encounter: Payer: Self-pay | Admitting: Family Medicine

## 2020-02-05 ENCOUNTER — Ambulatory Visit: Payer: Self-pay

## 2020-02-05 ENCOUNTER — Other Ambulatory Visit: Payer: Self-pay

## 2020-02-05 ENCOUNTER — Ambulatory Visit (LOCAL_COMMUNITY_HEALTH_CENTER): Payer: No Typology Code available for payment source | Admitting: Family Medicine

## 2020-02-05 VITALS — BP 145/92 | Ht 61.0 in | Wt 131.0 lb

## 2020-02-05 DIAGNOSIS — Z3200 Encounter for pregnancy test, result unknown: Secondary | ICD-10-CM

## 2020-02-05 DIAGNOSIS — Z3009 Encounter for other general counseling and advice on contraception: Secondary | ICD-10-CM | POA: Diagnosis not present

## 2020-02-05 NOTE — Progress Notes (Addendum)
Pt's BP 145/92 and provider made aware. Pt is here for physical, IUD removal and reinsertion. Pt reports she had a skyla IUD ~03/2016 placed at an OBGYN in [place] or [facility]. Pt reports last sex was 02/04/2020 without condom. Pt reports she thinks she had a pap smear in 2017 that was normal. The last Pap showing in Epic is 08/2014. Pt reports desires screening for BV.

## 2020-02-05 NOTE — Progress Notes (Signed)
   [facility] problem [facility]  Subjective:  Michelle Kelley is a 34 y.o. being seen today for   Chief Complaint  Patient presents with  . Contraception    Phyiscal and IUD removal and reinsertion    HPI Client scheduled for PE/IUD removal and insertion.  Client states that she had a Skyla IUD inserted in 2017. States that she had the IUD inserted after birth of child 2017.  She is complaining of spotting for a few months.  She and partner haven't been using condoms as a back-up.  She states they've had sex at least 3-4 times past week.  She doesn't want to be pregnant and will have another IUD-Mirena for her birth control because its 5-7 years of effectiveness.  Does the patient have a current or past history of drug use? Yes   No components found for: HCV]   Health Maintenance Due  Topic Date Due  . Hepatitis C Screening  Never done  . COVID-19 Vaccine (1) Never done  . TETANUS/TDAP  Never done  . PAP SMEAR-Modifier  08/28/2019  . INFLUENZA VACCINE  01/19/2020    ROS  The following portions of the patient's history were reviewed and updated as appropriate: allergies, current medications, past family history, past medical history, past social history, past surgical history and problem list. Problem list updated.   See flowsheet for other program required questions.  Objective:   Vitals:   02/05/20 1531  BP: (!) 145/92  Weight: 131 lb (59.4 kg)  Height: 5\' 1"  (1.549 m)    Physical Exam Client declined   Assessment and Plan:  Michelle Kelley is a 34 y.o. female presenting to the [facility] Department for a Women's Health problem visit  1. Family planning services  - Pregnancy, urine- undeterminate Client states that she will defer her well woman exam and IUD removal/insertion at this visit.  She will reschedule for 2 weeks, have PT and IUD procedures if PT is negative.  Co. Client that she and partner  need to use condoms with vaginal/penis sex always x 2 weeks.  Client agrees not to have unprotected sex.    No follow-ups on file.  Future Appointments  Date Time Provider Department Center  02/25/2020  3:20 PM [facility] PROVIDER [facility] None    04/26/2020, FNP

## 2020-02-06 LAB — PREGNANCY, URINE

## 2020-02-07 NOTE — Progress Notes (Signed)
UPT result near threshold and undeterminate and provider made aware. Counseled pt per Larene Pickett, FNP verbal order for pt to do a home pregnancy test 02/08/2020 and if it is positive to let us know. Pt scheduled for physical, IUD removal/ reinsertion, Pap, GC/Chlamydia, and UPT for 02/25/2020 at 3:20pm per pt request and per provider order. Pt aware of appt date and time and to arrive early for check-in, and appt card given to pt. Pt aware that from now until coming in for appt to either not have sex or to use condoms every time. Pt states she plans to abstain from vaginal sex. Counseled pt per provider orders and pt states understanding. Provider orders completed.

## 2020-02-25 ENCOUNTER — Other Ambulatory Visit: Payer: Self-pay | Admitting: Family Medicine

## 2020-02-25 ENCOUNTER — Encounter: Payer: Self-pay | Admitting: Family Medicine

## 2020-02-25 ENCOUNTER — Ambulatory Visit (LOCAL_COMMUNITY_HEALTH_CENTER): Payer: No Typology Code available for payment source | Admitting: Family Medicine

## 2020-02-25 ENCOUNTER — Other Ambulatory Visit: Payer: Self-pay

## 2020-02-25 VITALS — BP 152/108 | Ht 62.0 in | Wt 133.0 lb

## 2020-02-25 DIAGNOSIS — I10 Essential (primary) hypertension: Secondary | ICD-10-CM

## 2020-02-25 DIAGNOSIS — Z3200 Encounter for pregnancy test, result unknown: Secondary | ICD-10-CM | POA: Diagnosis not present

## 2020-02-25 DIAGNOSIS — Z113 Encounter for screening for infections with a predominantly sexual mode of transmission: Secondary | ICD-10-CM

## 2020-02-25 DIAGNOSIS — Z3009 Encounter for other general counseling and advice on contraception: Secondary | ICD-10-CM

## 2020-02-25 DIAGNOSIS — T8332XA Displacement of intrauterine contraceptive device, initial encounter: Secondary | ICD-10-CM

## 2020-02-25 LAB — PREGNANCY, URINE: Preg Test, Ur: NEGATIVE

## 2020-02-25 MED ORDER — NORETHINDRONE 0.35 MG PO TABS: 1.0000 | ORAL_TABLET | Freq: Every day | ORAL | 1 refills | Status: DC

## 2020-02-25 NOTE — Progress Notes (Signed)
Family Planning Visit- Repeat Yearly Visit  Subjective:  Michelle Kelley is a 34 y.o. [phone]  being seen today for an well woman visit and to discuss family planning options.    She is currently using IUD Skyla for pregnancy prevention. Patient reports she does not if she or her partner wants a pregnancy in the next year. Patient  has Ingrown toenail; Headache, migraine; Episodic mood disorder (HCC); Hx of preeclampsia, prior pregnancy, currently pregnant; Schizophrenia (HCC); Manic depressive disorder (HCC); History of C-section; Obesity; Tobacco user; Substance use disorder; Marijuana abuse; Postpartum care following cesarean delivery; and Postoperative anemia due to acute blood loss on their problem list.  Chief Complaint  Patient presents with   Annual Exam   Contraception    desires IUD removal and reinsertion    Patient reports she is here today to complete her annual exam and have here IUD removed/insertion.  She was last seen on 02/05/2020.  She denies sexual activity since this visit. She would like to have another Skyla IUD inserted.  Client states that she is aware that her BP is elevated and has had treatment for HTN in the past.  She states that she will consider being evaluated by her PCP for her HTN but is not committed to taking the medication.  Client states that she has been in therapy since she was about 34 yo.  She has been hospitalized for her mental concerns and also has received out-patient treatment.  She is currently in therapy.  Patient denies other concerns.  See flowsheet for other program required questions.   Body mass index is 24.33 kg/m. - Patient is eligible for diabetes screening based on BMI and age >60?  not applicable HA1C ordered? not applicable  Patient reports 1 of partners in last year. Desires STI screening?  Yes   Has patient been screened once for HCV in the past?  No. Unsure, declines bloodwork today.  No results found for: HCVAB  Does the  patient have current of drug use, have a partner with drug use, and/or has been incarcerated since last result? No  If yes-- Screen for HCV through [facility]   Does the patient meet criteria for HBV testing? No  Criteria:  -Household, sexual or needle sharing contact with HBV -History of drug use -HIV positive -Those with known Hep C   Health Maintenance Due  Topic Date Due   Hepatitis C Screening  Never done   COVID-19 Vaccine (1) Never done   TETANUS/TDAP  Never done   PAP SMEAR-Modifier  08/28/2019   INFLUENZA VACCINE  Never done    Review of Systems  Constitutional:       -Bilat. Nipple discharge- noted 2 weeks ago, thick yellow/greenm  Discharge could be squeezed from nipple, client had metal bars inserted into her nipples -Client lost ~200 lbs few years ago and had gained about 20 lb -swellling in fingers x 85month  Eyes: Positive for blurred vision.       Blurry vision- ongoing, intermittent-eyes may need exam.  Respiratory: Positive for shortness of breath and wheezing.        -H/o COPD ands asthma- managed in the past with inhalers, no treatment at present time.  Genitourinary:       Unexplained bleeding from vagina- ? If from IUD  Musculoskeletal:       Pain in calves with hiking  Neurological: Positive for headaches.       Has ah/o migraines- previous migraine treatment, no  meds now.  Endo/Heme/Allergies: Bruises/bleeds easily.       States has always been cold intolerant. States has noted that she bruises easily-    The following portions of the patient's history were reviewed and updated as appropriate: allergies, current medications, past family history, past medical history, past social history, past surgical history and problem list. Problem list updated.  Objective:   Vitals:   02/25/20 1455 02/25/20 1502  BP: (!) 155/105 (!) 152/108  Weight: 133 lb (60.3 kg) 133 lb (60.3 kg)  Height: 5\' 2"  (1.575 m) 5\' 2"  (1.575 m)    Physical  Exam Constitutional:      Appearance: Normal appearance.  Cardiovascular:     Rate and Rhythm: Normal rate.  Pulmonary:     Effort: Pulmonary effort is normal.     Breath sounds: Normal breath sounds.  Chest:     Breasts:        Right: Normal. No nipple discharge or tenderness.        Left: Normal. No nipple discharge or tenderness.     Comments: Bilateral nipple bars Genitourinary:    General: Normal vulva.     Pubic Area: No rash.      Labia:        Right: No rash, tenderness, lesion or injury.        Left: No rash, tenderness or lesion.      Vagina: Normal.     Cervix: Normal.     Comments: IUD strings not visualized,  Unable to retrieve IUD string after multiple attempts. Musculoskeletal:     Cervical back: Neck supple.  Lymphadenopathy:     Cervical: No cervical adenopathy.     Upper Body:     Right upper body: No supraclavicular, axillary or pectoral adenopathy.     Left upper body: No supraclavicular, axillary or pectoral adenopathy.     Lower Body: No right inguinal adenopathy. No left inguinal adenopathy.  Skin:    General: Skin is warm and dry.  Neurological:     Mental Status: She is alert and oriented to person, place, and time.    Assessment and Plan:  Michelle Kelley is a 35 y.o. female [phone] presenting to the [facility] Department for an yearly well woman exam/family planning visit  Contraception counseling: Reviewed all forms of birth control options in the tiered based approach. available including abstinence; over the counter/barrier methods; hormonal contraceptive medication including pill, patch, ring, injection,contraceptive implant, ECP; hormonal and nonhormonal IUDs; permanent sterilization options including vasectomy and the various tubal sterilization modalities. Risks, benefits, and typical effectiveness rates were reviewed.  Questions were answered.  Written information was also given to the patient to review.  Patient desires [id number] IUD  removal/insertion but this will not done today d/t unable to locate strings.  Discussed with client to take Micronor until IUD position is determined by [facility],  Micronor  was prescribed for patient. She will follow up in in 1-2 months for IUD insertion for surveillance.  She was told to call with any further questions, or with any concerns about this method of contraception.  Emphasized use of condoms 100% of the time for STI prevention.  Patient was not an ECP Candidate.   1. Possible pregnancy, not yet confirmed - Pregnancy, urine- negative.  2. Screening examination for sexually transmitted disease - Chlamydia/Gonorrhea [facility]  3. Family planning services - IGP, Aptima HPV - Counseled client that a back up [facility] is advised until the IUD placement  is determined.  D/T client's hypertension a POP was offered.  Client agrees to this treatment. Micronor 1 po daily x 2 packs.  Co client to use condoms for back-up x 1 week after starting pills.  Client agrees to the plan and states that she may continue to abstain 4. IUD threads lost-initial encounter Refer to [facility] for [place]- appt. 02/28/2020 @ 8:30  Am.  Instruct client to drink 32 oz water 1 hour prior to [place].  5.  Hypertension Client states that she had been diagnosed with HTN and was taking medication but dc'd.  She states that she doesn't want to take medication anymore. Encouraged her to make appointment with PCP to have evaluation/work-up.  Discussed the health the health risks os not having BP in control to vital organs.  She states that she may make an appointment.  6. Depression/Mood Disorder PHQ-9 =8.  Client is in therapy- couldn't remember the name of the clinic.  Regularly sees therapist.  Declined [facility] card. No follow-ups on file.  Future Appointments  Date Time Provider Department Center  02/28/2020  8:30 AM [facility] 3 [facility]    Larene Pickett, FNP

## 2020-02-25 NOTE — Progress Notes (Signed)
Regarding BP: Pt denies headache, blurred vision, dizziness. Pt states she stopped BP medications around 2016-2017 and this was last time she was seen for BP; was told losing wt could help BP, she lost wt and it is still high. Pt at [facility] for physical and IUD removal/reinsertion. Pt states there has not been any changes to medical and surgical history since she was here on 02/05/20.

## 2020-02-26 ENCOUNTER — Telehealth: Payer: Self-pay

## 2020-02-26 ENCOUNTER — Encounter: Payer: Self-pay | Admitting: Family Medicine

## 2020-02-26 NOTE — Telephone Encounter (Signed)
Phone call to pt. Pt given all the information regarding U/S appt on 02/28/20. Location [facility], go to registration desk for check in. Pt expresses understanding of all instructions and plans to be at appt.

## 2020-02-26 NOTE — Telephone Encounter (Signed)
Pt is scheduled for Ultrasound of Pelvis with Transvaginal - "locate IUD."  Appointment 02/28/2020 at 8:30 am and come with full bladder; drink 32 oz 1 hour before; arrive at 8:00 am.

## 2020-02-26 NOTE — Progress Notes (Signed)
Pt decided to use OCP (Micronor) until IUD placement is determined; Rx to be sent to pt's pharmacy; consent reviewed and signed. Condoms declined. Provider orders completed.  Provider contacted [facility] after pt posting and scheduled U/S of Pelvis with Transvaginal to locate IUD; appt on 02/28/20 at 8:30, come with full bladder, drink 32 oz 1 hour before; arrive at 8:00 (see phone note 02/26/20)

## 2020-02-26 NOTE — Telephone Encounter (Signed)
Phone call to pt at [phone], phone would ring and then just hang up, no voicemail; called x3.  During 02/26/20 visit, pt provided an alternate phone number to call, just in case we had trouble reaching her at her main number. Called pt at alternate number, [phone], female answered phone and offered to take a message for pt;  left message that pt's doctors office is calling with details about an appt that has been scheduled and asked that she call back for details.

## 2020-02-27 LAB — IGP, APTIMA HPV
HPV Aptima: POSITIVE — AB
PAP Smear Comment: 0

## 2020-02-28 ENCOUNTER — Other Ambulatory Visit: Payer: Self-pay

## 2020-02-28 ENCOUNTER — Ambulatory Visit
Admission: RE | Admit: 2020-02-28 | Discharge: 2020-02-28 | Disposition: A | Discharge status: Home / Self Care | Payer: No Typology Code available for payment source | Source: Ambulatory Visit | Attending: Family Medicine | Admitting: Family Medicine

## 2020-02-28 DIAGNOSIS — T8332XA Displacement of intrauterine contraceptive device, initial encounter: Secondary | ICD-10-CM

## 2020-03-04 ENCOUNTER — Ambulatory Visit: Payer: No Typology Code available for payment source

## 2020-03-04 ENCOUNTER — Other Ambulatory Visit: Payer: Self-pay

## 2020-03-04 DIAGNOSIS — A599 Trichomoniasis, unspecified: Secondary | ICD-10-CM

## 2020-03-04 MED ORDER — METRONIDAZOLE 500 MG PO TABS: 2000.0000 mg | ORAL_TABLET | Freq: Once | ORAL | 0 refills | Status: AC

## 2020-03-04 NOTE — Progress Notes (Signed)
Pt states she has had metronidazole before without any problems. Pt treated for trich (see 02/25/20 PAP result) per Sadie Haber, PA order dated 02/27/20.

## 2020-03-11 ENCOUNTER — Telehealth: Payer: Self-pay

## 2020-03-11 NOTE — Telephone Encounter (Signed)
Phone call to pt. Unable to leave message, rang several times then had busy signal. 

## 2020-03-11 NOTE — Telephone Encounter (Signed)
Phone call to pt. Unable to leave message, rang several times then had busy signal.

## 2020-03-11 NOTE — Telephone Encounter (Signed)
-----   Message from Larene Pickett, FNP sent at 03/10/2020  5:10 PM EDT ----- Reviewed [place] report.  IUD not in uterus, schedule patient for return visit.

## 2020-03-12 NOTE — Telephone Encounter (Signed)
Phone call to pt. Unable to leave message, rang several times then had busy signal. 

## 2020-03-13 NOTE — Telephone Encounter (Signed)
Call to patient, no answer. Unable to leave message. Rn sent message via mychart regarding need for follow up visit.  Harvie Heck, RN

## 2020-03-13 NOTE — Telephone Encounter (Signed)
Call to patient, the phone only rang, did not go to voicemail, then disconnected. Will call patient back at later time.  Harvie Heck, RN

## 2020-12-16 ENCOUNTER — Ambulatory Visit (LOCAL_COMMUNITY_HEALTH_CENTER): Payer: No Typology Code available for payment source

## 2020-12-16 ENCOUNTER — Other Ambulatory Visit: Payer: Self-pay

## 2020-12-16 VITALS — Ht 62.0 in | Wt 146.5 lb

## 2020-12-16 DIAGNOSIS — Z3201 Encounter for pregnancy test, result positive: Secondary | ICD-10-CM

## 2020-12-16 LAB — PREGNANCY, URINE: Preg Test, Ur: POSITIVE — AB

## 2020-12-16 MED ORDER — PRENATAL VITAMIN 27-0.8 MG PO TABS: 1.0000 | ORAL_TABLET | Freq: Every day | ORAL | 0 refills | Status: AC

## 2020-12-16 NOTE — Progress Notes (Signed)
Pt declined BP, stating she has struggle with it for a long time. Pt desires care at [facility]; she had prenatal care there before. Pt to call [facility] ASAP. Pt sent to preadmit.

## 2021-01-13 ENCOUNTER — Ambulatory Visit (INDEPENDENT_AMBULATORY_CARE_PROVIDER_SITE_OTHER): Payer: No Typology Code available for payment source | Admitting: Advanced Practice Midwife

## 2021-01-13 ENCOUNTER — Encounter: Payer: Self-pay | Admitting: Advanced Practice Midwife

## 2021-01-13 ENCOUNTER — Other Ambulatory Visit: Payer: Self-pay

## 2021-01-13 VITALS — BP 132/83 | HR 120 | Wt 151.0 lb

## 2021-01-13 DIAGNOSIS — O0992 Supervision of high risk pregnancy, unspecified, second trimester: Secondary | ICD-10-CM

## 2021-01-13 DIAGNOSIS — Z113 Encounter for screening for infections with a predominantly sexual mode of transmission: Secondary | ICD-10-CM

## 2021-01-13 DIAGNOSIS — Z124 Encounter for screening for malignant neoplasm of cervix: Secondary | ICD-10-CM

## 2021-01-13 DIAGNOSIS — Z8619 Personal history of other infectious and parasitic diseases: Secondary | ICD-10-CM

## 2021-01-13 DIAGNOSIS — Z3689 Encounter for other specified antenatal screening: Secondary | ICD-10-CM

## 2021-01-13 DIAGNOSIS — Z1159 Encounter for screening for other viral diseases: Secondary | ICD-10-CM

## 2021-01-13 DIAGNOSIS — F32A Depression, unspecified: Secondary | ICD-10-CM

## 2021-01-13 DIAGNOSIS — O09299 Supervision of pregnancy with other poor reproductive or obstetric history, unspecified trimester: Secondary | ICD-10-CM

## 2021-01-13 DIAGNOSIS — F419 Anxiety disorder, unspecified: Secondary | ICD-10-CM

## 2021-01-13 DIAGNOSIS — Z369 Encounter for antenatal screening, unspecified: Secondary | ICD-10-CM

## 2021-01-13 DIAGNOSIS — F1210 Cannabis abuse, uncomplicated: Secondary | ICD-10-CM

## 2021-01-13 DIAGNOSIS — O99340 Other mental disorders complicating pregnancy, unspecified trimester: Secondary | ICD-10-CM

## 2021-01-13 NOTE — Progress Notes (Signed)
NOB 

## 2021-01-13 NOTE — Patient Instructions (Signed)
Eating Plan for Pregnant Women While you are pregnant, your body requires additional nutrition to help support your growing baby. You also have a higher need for some vitamins and minerals, such as folic acid, calcium, iron, and vitamin D. Eating a healthy, well-balanced diet is very important for your health and your baby's health. Your need for extra calories varies over the course of your pregnancy. Pregnancy is divided into three trimesters, with each trimester lasting 3 months. For most women, it is recommended to consume: 150 extra calories a day during the first trimester. 300 extra calories a day during the second trimester. 300 extra calories a day during the third trimester. What are tips for following this plan? Cooking Practice good food safety and cleanliness. Wash your hands before you eat and after you prepare raw meat. Wash all fruits and vegetables well before peeling or eating. Taking these actions can help to prevent foodborne illnesses that can be very dangerous to your baby, such as listeriosis. Ask your health care provider for more information about listeriosis. Make sure that all meats, poultry, and eggs are cooked to food-safe temperatures or "well-done." Meal planning  Eat a variety of foods (especially fruits and vegetables) to get a full range of vitamins and minerals. Two or more servings of fish are recommended each week in order to get the most benefits from omega-3 fatty acids that are found in seafood. Choose fish that are lower in mercury, such as salmon and pollock. Limit your overall intake of foods that have "empty calories." These are foods that have little nutritional value, such as sweets, desserts, candies, and sugar-sweetened beverages. Drinks that contain caffeine are okay to drink, but it is better to avoid caffeine. Keep your total caffeine intake to less than 200 mg each day (which is 12 oz or 355 mL of coffee, tea, or soda) or the limit as told by your  health care provider.  General information Do not try to lose weight or go on a diet during pregnancy. Take a prenatal vitamin to help meet your additional vitamin and mineral needs during pregnancy, specifically for folic acid, iron, calcium, and vitamin D. Remember to stay active. Ask your health care provider what types of exercise and activities are safe for you. What does 150 extra calories look like? Healthy options that provide 150 extra calories each day could be any of the following: 6-8 oz (170-227 g) plain low-fat yogurt with  cup (70 g) berries. 1 apple with 2 tsp (11 g) peanut butter. Cut-up vegetables with  cup (60 g) hummus. 8 fl oz (237 mL) low-fat chocolate milk. 1 stick of string cheese with 1 medium orange. 1 peanut butter and jelly sandwich that is made with one slice of whole-wheat bread and 1 tsp (5 g) of peanut butter. For 300 extra calories, you could eat two of these healthy options each day. What is a healthy amount of weight to gain? The right amount of weight gain for you is based on your BMI (body mass index) before you became pregnant. If your BMI was less than 18 (underweight), you should gain 28-40 lb (13-18 kg). If your BMI was 18-24.9 (normal), you should gain 25-35 lb (11-16 kg). If your BMI was 25-29.9 (overweight), you should gain 15-25 lb (7-11 kg). If your BMI was 30 or greater (obese), you should gain 11-20 lb (5-9 kg). What if I am having twins or multiples? Generally, if you are carrying twins or multiples: You may need to  eat 300-600 extra calories a day. The recommended range for total weight gain is 25-54 lb (11-25 kg), depending on your BMI before pregnancy. Talk with your health care provider to find out about nutritional needs, weight gain, and exercise that is right for you. What foods should I eat?  Fruits All fruits. Eat a variety of colors and types of fruit. Remember to wash yourfruits well before peeling or eating. Vegetables All  vegetables. Eat a variety of colors and types of vegetables. Remember towash your vegetables well before peeling or eating. Grains All grains. Choose whole grains, such as whole-wheat bread, oatmeal, or brownrice. Meats and other protein foods Lean meats, including chicken, turkey, and lean cuts of beef, veal, or pork. Fish that is higher in omega-3 fatty acids and lower in mercury, such as salmon, herring, mussels, trout, sardines, pollock, shrimp, crab, and lobster.Tofu. Tempeh. Beans. Eggs. Peanut butter and other nut butters. Dairy Pasteurized milk and milk alternatives, such as almond milk. Pasteurized yogurtand pasteurized cheese. Cottage cheese. Sour cream. Beverages Water. Juices that contain 100% fruit juice or vegetable juice. Caffeine-freeteas and decaffeinated coffee. Fats and oils Fats and oils are okay to include in moderation. Sweets and desserts Sweets and desserts are okay to include in moderation. Seasoning and other foods All pasteurized condiments. The items listed above may not be a complete list of foods and beverages you can eat. Contact a dietitian for more information. What foods should I avoid? Fruits Raw (unpasteurized) fruit juices. Vegetables Unpasteurized vegetable juices. Meats and other protein foods Precooked or cured meat, such as bologna, hot dogs, sausages, or meat loaves. (If you must eat those meats, reheat them until they are steaming hot.) Refrigerated pate, meat spreads from a meat counter, or smoked seafood that is found in the refrigerated section of a store. Raw or undercooked meats, poultry, and eggs. Raw fish, such as sushi or sashimi. Fish that have highmercury content, such as tilefish, shark, swordfish, and king mackerel. Dairy Unpasteurized milk and any foods that have unpasteurized milk in them. Soft cheeses, such as feta, queso blanco, queso fresco, Brie, Camembert, panela, and blue-veined cheeses (unless they are made with pasteurized milk,  which must bestated on the label). Beverages Alcohol. Sugar-sweetened beverages, such as sodas, teas, or energy drinks. Seasoning and other foods Homemade fermented foods and drinks, such as pickles, sauerkraut, or kombuchadrinks. (Store-bought pasteurized versions of these are okay.) Salads that are made in a store or deli, such as ham salad, chicken salad, eggsalad, tuna salad, and seafood salad. The items listed above may not be a complete list of foods and beverages you should avoid. Contact a dietitian for more information. Where to find more information To calculate the number of calories you need based on your height, weight, and activity level, you can use an online calculator such as: www.myplate.gov/myplate-plan To calculate how much weight you should gain during pregnancy, you can use an online pregnancy weight gain calculator such as: www.myplate.gov To learn more about eating fish during pregnancy, talk with your health care provider or visit: www.fda.gov Summary While you are pregnant, your body requires additional nutrition to help support your growing baby. Eat a variety of foods, especially fruits and vegetables, to get a full range of vitamins and minerals. Practice good food safety and cleanliness. Wash your hands before you eat and after you prepare raw meat. Wash all fruits and vegetables well before peeling or eating. Taking these actions can help to prevent foodborne illnesses, such as listeriosis, that   can be very dangerous to your baby. Do not eat raw meat or fish. Do not eat fish that have high mercury content, such as tilefish, shark, swordfish, and king mackerel. Do not eat raw (unpasteurized) dairy. Take a prenatal vitamin to help meet your additional vitamin and mineral needs during pregnancy, specifically for folic acid, iron, calcium, and vitamin D. This information is not intended to replace advice given to you by your health care provider. Make sure you discuss  any questions you have with your healthcare provider. Document Revised: 01/02/2020 Document Reviewed: 01/02/2020 Elsevier Patient Education  2022 [employer] Exercise During Pregnancy Exercise is an important part of being healthy for people of all ages. Exercise improves the function of your heart and lungs and helps you maintain strength, flexibility, and a healthy body weight. Exercise also boosts energy levels andelevates mood. Most women should exercise regularly during pregnancy. Exercise routines may need to change as your pregnancy progresses. In rare cases, women with certain medical conditions or complications may be asked to limit or avoid exercise during pregnancy. Your health care provider will give you information on whatwill work for you. How does this affect me? Along with maintaining general strength and flexibility, exercising during pregnancy can help: Keep strength in muscles that are used during labor and childbirth. Decrease low back pain or symptoms of depression. Control weight gain during pregnancy. Reduce the risk of needing insulin if you develop diabetes during pregnancy. Decrease the risk of cesarean delivery. Speed up your recovery after giving birth. Relieve constipation. How does this affect my baby? Exercise can help you have a healthy pregnancy. Exercise does not cause early (premature) birth. It will not cause your baby to weigh less at birth. What exercises can I do? Many exercises are safe for you to do during pregnancy. Do a variety of exercises that safely increase your heart and breathing rates and help you build and maintain muscle strength. Do exercises exactly as told by your health care provider. You may do these exercises: Walking. Swimming. Water aerobics. Riding a stationary bike. Modified yoga or Pilates. Tell your instructor that you are pregnant. Avoid overstretching, and avoid lying on your back for long periods of time. Running or  jogging. Choose this type of exercise only if: You ran or jogged regularly before your pregnancy. You can run or jog and still talk in complete sentences. What exercises should I avoid? You may be told to limit high-intensity exercise depending on your level of fitness and whether you exercised regularly before you were pregnant. You can tell that you are exercising at a high intensity if you are breathing much harder and faster and cannot hold a conversation while exercising. You must avoid: Contact sports. Activities that put you at risk for falling on or being hit in the belly, such as downhill skiing, waterskiing, surfing, rock climbing, cycling, gymnastics, and horseback riding. Scuba diving. Skydiving. Hot yoga or hot Pilates. These activities take place in a room that is heated to high temperatures. Jogging or running, unless you jogged or ran regularly before you were pregnant. While jogging or running, you should always be able to talk in full sentences. Do not run or jog so fast that you are unable to have a conversation. Do not exercise at more than 6,000 feet above sea level (high elevation) if you are not used to exercising at high elevation. How do I exercise in a safe way?  Avoid overheating. Do not exercise in very high temperatures.  Wear loose-fitting, breathable clothes. Avoid dehydration. Drink enough fluid before, during, and after exercise to keep your urine pale yellow. Avoid overstretching. Because of hormone changes during pregnancy, it is easy to overstretch muscles, tendons, and ligaments. Start slowly and ask your health care provider to recommend the types of exercise that are safe for you. Do not exercise to lose weight. Wear a sports bra to support your breasts. Avoid standing still or lying flat on your back as much as you can. Follow these instructions at home: Exercise on most days or all days of the week. Try to exercise for 30 minutes a day, 5 days a week,  unless your health care provider tells you not to. If you actively exercised before your pregnancy and you are healthy, your health care provider may tell you to continue to do moderate-intensity to high-intensity exercise. If you are just starting to exercise or did not exercise much before your pregnancy, your health care provider may tell you to do low-intensity to moderate-intensity exercise. Questions to ask your health care provider Is exercise safe for me? What are signs that I should stop exercising? Does my health condition mean that I should not exercise during pregnancy? When should I avoid exercising during pregnancy? Stop exercising and contact a health care provider if: You have any unusual symptoms such as: Mild contractions of the uterus or cramps in the abdomen. A dizzy feeling that does not go away when you rest. Stop exercising and get help right away if: You have any unusual symptoms such as: Sudden, severe pain in your low back or your belly. Regular, painful contractions of your uterus. Chest pain. Bleeding or fluid leaking from your vagina. Shortness of breath. Headache. Pain and swelling of your calves. Summary Most women should exercise regularly throughout pregnancy. In rare cases, women with certain medical conditions or complications may be asked to limit or avoid exercise during pregnancy. Do not exercise to lose weight during pregnancy. Your health care provider will tell you what level of physical activity is right for you. Stop exercising and contact a health care provider if you have unusual symptoms, such as mild contractions or dizziness. This information is not intended to replace advice given to you by your health care provider. Make sure you discuss any questions you have with your healthcare provider. Document Revised: 01/22/2020 Document Reviewed: 01/22/2020 Elsevier Patient Education  2022 [employer]  Inc[employer] https://www.acog.org/womens-health/faqs/prenatal-genetic-screening-tests">  Prenatal Care Prenatal care is health care during pregnancy. It helps you and your unborn baby (fetus) stay as healthy as possible. Prenatal care may be provided by a midwife, a family practice doctor, a Dispensing optician (nurse practitioner or physician assistant), or a childbirth and pregnancy doctor (obstetrician). How does this affect me? During pregnancy, you will be closely monitored for any new conditions that might develop. To lower your risk of pregnancy complications, you and yourhealth care provider will talk about any underlying conditions you have. How does this affect my baby? Early and consistent prenatal care increases the chance that your baby will be healthy during pregnancy. Prenatal care lowers the risk that your baby will be: Born early (prematurely). Smaller than expected at birth (small for gestational age). What can I expect at the first prenatal care visit? Your first prenatal care visit will likely be the longest. You should schedule your first prenatal care visit as soon as you know that you are pregnant. Your first visit is a good time to talk about any questions or concerns you  haveabout pregnancy. Medical history At your visit, you and your health care provider will talk about your medical history, including: Any past pregnancies. Your family's medical history. Medical history of the baby's father. Any long-term (chronic) health conditions you have and how you manage them. Any surgeries or procedures you have had. Any current over-the-counter or prescription medicines, herbs, or supplements that you are taking. Other factors that could pose a risk to your baby, including: Exposure to harmful chemicals or radiation at work or at home. Any substance use, including tobacco, alcohol, and drug use. Your home setting and your stress levels, including: Exposure to abuse or  violence. Household financial strain. Your daily health habits, including diet and exercise. Tests and screenings Your health care provider will: Measure your weight, height, and blood pressure. Do a physical exam, including a pelvic and breast exam. Perform blood tests and urine tests to check for: Urinary tract infection. Sexually transmitted infections (STIs). Low iron levels in your blood (anemia). Blood type and certain proteins on red blood cells (Rh antibodies). Infections and immunity to viruses, such as hepatitis B and rubella. HIV (human immunodeficiency virus). Discuss your options for genetic screening. Tips about staying healthy Your health care provider will also give you information about how to keep yourself and your baby healthy, including: Nutrition and taking vitamins. Physical activity. How to manage pregnancy symptoms such as nausea and vomiting (morning sickness). Infections and substances that may be harmful to your baby and how to avoid them. Food safety. Dental care. Working. Travel. Warning signs to watch for and when to call your health care provider. How often will I have prenatal care visits? After your first prenatal care visit, you will have regular visits throughout your pregnancy. The visit schedule is often as follows: Up to week 28 of pregnancy: once every 4 weeks. 28-36 weeks: once every 2 weeks. After 36 weeks: every week until delivery. Some women may have visits more or less often depending on any underlyinghealth conditions and the health of the baby. Keep all follow-up and prenatal care visits. This is important. What happens during routine prenatal care visits? Your health care provider will: Measure your weight and blood pressure. Check for fetal heart sounds. Measure the height of your uterus in your abdomen (fundal height). This may be measured starting around week 20 of pregnancy. Check the position of your baby inside your  uterus. Ask questions about your diet, sleeping patterns, and whether you can feel the baby move. Review warning signs to watch for and signs of labor. Ask about any pregnancy symptoms you are having and how you are dealing with them. Symptoms may include: Headaches. Nausea and vomiting. Vaginal discharge. Swelling. Fatigue. Constipation. Changes in your vision. Feeling persistently sad or anxious. Any discomfort, including back or pelvic pain. Bleeding or spotting. Make a list of questions to ask your health care provider at your routinevisits. What tests might I have during prenatal care visits? You may have blood, urine, and imaging tests throughout your pregnancy, such as: Urine tests to check for glucose, protein, or signs of infection. Glucose tests to check for a form of diabetes that can develop during pregnancy (gestational diabetes mellitus). This is usually done around week 24 of pregnancy. Ultrasounds to check your baby's growth and development, to check for birth defects, and to check your baby's well-being. These can also help to decide when you should deliver your baby. A test to check for group B strep (GBS) infection. This is usually   done around week 36 of pregnancy. Genetic testing. This may include blood, fluid, or tissue sampling, or imaging tests, such as an ultrasound. Some genetic tests are done during the first trimester and some are done during the second trimester. What else can I expect during prenatal care visits? Your health care provider may recommend getting certain vaccines during pregnancy. These may include: A yearly flu shot (annual influenza vaccine). This is especially important if you will be pregnant during flu season. Tdap (tetanus, diphtheria, pertussis) vaccine. Getting this vaccine during pregnancy can protect your baby from whooping cough (pertussis) after birth. This vaccine may be recommended between weeks 27 and 36 of pregnancy. A COVID-19  vaccine. Later in your pregnancy, your health care provider may give you information about: Childbirth and breastfeeding classes. Choosing a health care provider for your baby. Umbilical cord banking. Breastfeeding. Birth control after your baby is born. The hospital labor and delivery unit and how to set up a tour. Registering at the hospital before you go into labor. Where to find more information Office on Women's Health: womenshealth.gov American Pregnancy Association: americanpregnancy.org March of D[employer]: marchofdimes.org Summary Prenatal care helps you and your baby stay as healthy as possible during pregnancy. Your first prenatal care visit will most likely be the longest. You will have visits and tests throughout your pregnancy to monitor your health and your baby's health. Bring a list of questions to your visits to ask your health care provider. Make sure to keep all follow-up and prenatal care visits. This information is not intended to replace advice given to you by your health care provider. Make sure you discuss any questions you have with your healthcare provider. Document Revised: 03/19/2020 Document Reviewed: 03/19/2020 Elsevier Patient Education  2022 [employer] Inc.  

## 2021-01-14 ENCOUNTER — Other Ambulatory Visit: Payer: Self-pay | Admitting: Advanced Practice Midwife

## 2021-01-14 DIAGNOSIS — O0992 Supervision of high risk pregnancy, unspecified, second trimester: Secondary | ICD-10-CM | POA: Insufficient documentation

## 2021-01-14 DIAGNOSIS — O0993 Supervision of high risk pregnancy, unspecified, third trimester: Secondary | ICD-10-CM | POA: Insufficient documentation

## 2021-01-15 ENCOUNTER — Encounter: Payer: Self-pay | Admitting: Advanced Practice Midwife

## 2021-01-15 NOTE — Progress Notes (Signed)
New Obstetric Patient H&P    Chief Complaint: "Desires prenatal care"   History of Present Illness: Patient is a 35 y.o. [phone] Not Hispanic or Latino female, presents with amenorrhea and positive home pregnancy test. Patient's last menstrual period was 09/18/2020 (approximate). and based on her  LMP, her EDD is Estimated Date of Delivery: 06/25/21 and her EGA is [redacted]w[redacted]d. Cycles are 3-4 days, regular, and occur approximately every : 28 days. Her last pap smear was 1 years ago and was  NIL/+HR HPV . She had an IUD and found out at [facility] that recent u/s result was no IUD seen.   She had a urine pregnancy test which was positive 4 week(s)  ago. Her last menstrual period was normal and lasted for  3 day(s). Since her LMP she claims she has experienced breast tenderness, nausea, fatigue. She denies vaginal bleeding. Her past medical history is contributory for gastric sleeve in 2013. Her prior pregnancies are notable for  SAB, 2003 svd preeclampsia/preterm, 2011 c/s, 2017 repeat c/s (only child in her custody). Desires repeat c/s.   Since her LMP, she admits to the use of tobacco products  yes 5-7 cpd She claims she has gained  11  pounds since the start of her pregnancy.  There are cats in the home in the home  yes Indoor/no litterbox exposure She admits close contact with children on a regular basis  yes  She has had chicken pox in the past yes She has had Tuberculosis exposures, symptoms, or previously tested positive for TB   no Current or past history of domestic violence. no  Genetic Screening/Teratology Counseling: (Includes patient, baby's father, or anyone in either family with:)   1. Patient's age >/= 47 at [facility]  no 2. Thalassemia ([place], [place], Mediterranean, or Asian background): MCV<80  no 3. Neural tube defect (meningomyelocele, spina bifida, anencephaly)  no 4. Congenital heart defect  no  5. Down syndrome  no 6. Tay-Sachs (Jewish, [place]  no 7. Canavan's Disease  no 8.  Sickle cell disease or trait (African)  no  9. Hemophilia or other blood disorders  no  10. Muscular dystrophy  no  11. Cystic fibrosis  no  12. Huntington's Chorea  no  13. Mental retardation/autism  no 14. Other inherited genetic or chromosomal disorder  no 15. Maternal metabolic disorder (DM, PKU, etc)  no 16. Patient or FOB with a child with a birth defect not listed above no  16a. Patient or FOB with a birth defect themselves no 17. Recurrent pregnancy loss, or stillbirth  no  18. Any medications since LMP other than prenatal vitamins (include vitamins, supplements, OTC meds, drugs, alcohol)  no 19. Any other genetic/environmental exposure to discuss  no  Infection History:   1. Lives with someone with TB or TB exposed  no  2. Patient or partner has history of genital herpes  thinks she tested positive for herpes in past/no outbreaks 3. Rash or viral illness since LMP  no 4. History of STI (GC, CT, HPV, syphilis, HIV)  Trichomonas infection 5. History of recent travel :  no  Other pertinent information:  high anxiety regarding possible weight gain with pregnancy; prefers step on scale backwards   Review of Systems:10 point review of systems negative unless otherwise noted in HPI  Past Medical History:  Patient Active Problem List   Diagnosis Date Noted   Supervision of high risk pregnancy in second trimester 01/14/2021     Nursing Staff Provider  Office Location  [facility]ating    Language  English Anatomy US    Flu Vaccine   Genetic Screen  NIPS:   TDaP vaccine    Hgb A1C or  GTT Early : NA Third trimester :   Covid    LAB RESULTS   Rhogam   Blood Type     Feeding Plan Formula Antibody    Contraception  Rubella    Circumcision  RPR     Pediatrician   HBsAg     Support Person Michael HIV    Prenatal Classes  Varicella     GBS  (For PCN allergy, check sensitivities)   BTL Consent     VBAC Consent  Pap      Hgb Electro    Pelvis Tested  CF      SMA      Hx  gastric sleeve/has anxiety       Marijuana abuse 10/13/2015   Schizophrenia (HCC) 10/07/2015   Manic depressive disorder (HCC) 10/07/2015   Obesity 10/07/2015   Tobacco user 10/07/2015   Substance use disorder 10/07/2015    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   LAPAROSCOPIC GASTRIC SLEEVE RESECTION     PANNICULECTOMY     TONSILLECTOMY      Gynecologic History: Patient's last menstrual period was 09/18/2020 (approximate).  Obstetric History: [id number]  Family History:  Family History  Problem Relation Age of Onset   Hypertension Mother    Heart disease Mother    Diabetes Mother    Lung disease Mother    Clotting disorder Mother    Bipolar disorder Mother    Mental illness Sister        SCHIZOPHRENIA  Ovarian cancer: mother. She declined MyRisk testing  Social History:  Social History   Socioeconomic History   Marital status: Married    Spouse name: Not on file   Number of children: Not on file   Years of education: Not on file   Highest education level: Not on file  Occupational History   Not on file  Tobacco Use   Smoking status: Every Day    Packs/day: 0.50    Types: Cigarettes   Smokeless tobacco: Never  Vaping Use   Vaping Use: Some days  Substance and Sexual Activity   Alcohol use: Not Currently   Drug use: Not Currently    Types: Marijuana, Cocaine    Comment: Molly in  past.   Sexual activity: Yes  Other Topics Concern   Not on file  Social History Narrative   Not on file   Social Determinants of Health   Financial Resource Strain: Not on file  Food Insecurity: Not on file  Transportation Needs: Not on file  Physical Activity: Not on file  Stress: Not on file  Social Connections: Not on file  Intimate Partner Violence: Not At Risk   Fear of Current or Ex-Partner: No   Emotionally Abused: No   Physically Abused: No   Sexually Abused: No    Allergies:  Allergies  Allergen Reactions   Petroleum Distillate Rash   Amitriptyline    Eggs Or Egg-Derived Products Nausea And Vomiting and Other (See Comments)    "Feels like rocks in my stomach."   Latex    Nsaids     Only oral NSAIDS   Penicillins    Sulfa Antibiotics     Medications: Prior to Admission medications   Medication Sig Start Date End Date Taking? Authorizing Provider  albuterol (VENTOLIN HFA) 108 (90 Base) MCG/ACT inhaler Inhale into the lungs. 06/17/20  Yes [provider]  Prenatal Vit-Fe Fumarate-FA (PRENATAL VITAMIN) 27-0.8 MG TABS Take 1 tablet by mouth daily. 12/16/20 03/26/21 Yes Federico Flake, MD    Physical Exam Vitals: Blood pressure 132/83, pulse (!) 120, weight 151 lb (68.5 kg), last menstrual period 09/18/2020.  General: NAD HEENT: normocephalic, anicteric Thyroid: no enlargement, no palpable nodules Pulmonary: No increased work of breathing, CTAB Cardiovascular: RRR, distal pulses 2+ Abdomen: NABS, soft, non-tender, non-distended.  Umbilicus without lesions.  No hepatomegaly, splenomegaly or masses palpable. No evidence of hernia. FHTs by doppler 170 mid lower abdomen Genitourinary: declines Extremities: no edema, erythema, or tenderness Neurologic: Grossly intact Psychiatric: mood appropriate, affect full   The following were addressed during this visit:  Breastfeeding Education - Individualized Education    Comments: Contraindications  to breastfeeding and other special medical conditions. Patient has heavy gauge nipple piercings and does not think she can breast feed. She prefers to formula feed and she declines breastfeeding education.    Assessment: 35 y.o. [id number] at [redacted]w[redacted]d by approximate LMP presenting to initiate prenatal care  Plan: 1) Avoid alcoholic beverages. 2) Patient encouraged not to smoke.  3) Discontinue the use of all non-medicinal drugs and chemicals.  4) Take prenatal vitamins daily.  5) Nutrition, food safety (fish, cheese advisories, and high nitrite foods) and exercise discussed. 6) Hospital and practice style discussed with cross coverage system.  7) Genetic Screening, such as with 1st Trimester Screening, cell free fetal DNA, AFP testing, and Ultrasound, as well as with amniocentesis and CVS as appropriate, is discussed with patient. At the conclusion of today's visit patient declined genetic testing 8) Patient is asked about travel to areas at risk for the [place] virus, and counseled to avoid travel and exposure to mosquitoes or sexual partners who may have themselves been exposed to the virus. Testing is discussed, and will be ordered as appropriate.  9) Return to office in 1 week for ALL labs (future ordered) and MD visit 10) Dating/anatomy scan with MFM in 2 weeks   Tresea Mall, CNM [facility] 01/15/2021, 4:06 PM

## 2021-01-21 ENCOUNTER — Other Ambulatory Visit: Payer: Self-pay | Admitting: Advanced Practice Midwife

## 2021-01-21 ENCOUNTER — Ambulatory Visit (INDEPENDENT_AMBULATORY_CARE_PROVIDER_SITE_OTHER): Payer: PRIVATE HEALTH INSURANCE | Admitting: Obstetrics and Gynecology

## 2021-01-21 ENCOUNTER — Other Ambulatory Visit (HOSPITAL_COMMUNITY)
Admission: RE | Admit: 2021-01-21 | Discharge: 2021-01-21 | Disposition: A | Discharge status: Home / Self Care | Payer: No Typology Code available for payment source | Source: Ambulatory Visit | Attending: Obstetrics and Gynecology | Admitting: Obstetrics and Gynecology

## 2021-01-21 ENCOUNTER — Other Ambulatory Visit: Payer: Self-pay

## 2021-01-21 VITALS — BP 131/84 | Wt 160.0 lb

## 2021-01-21 DIAGNOSIS — Z113 Encounter for screening for infections with a predominantly sexual mode of transmission: Secondary | ICD-10-CM | POA: Insufficient documentation

## 2021-01-21 DIAGNOSIS — Z369 Encounter for antenatal screening, unspecified: Secondary | ICD-10-CM

## 2021-01-21 DIAGNOSIS — F1210 Cannabis abuse, uncomplicated: Secondary | ICD-10-CM

## 2021-01-21 DIAGNOSIS — Z124 Encounter for screening for malignant neoplasm of cervix: Secondary | ICD-10-CM

## 2021-01-21 DIAGNOSIS — O0992 Supervision of high risk pregnancy, unspecified, second trimester: Secondary | ICD-10-CM

## 2021-01-21 DIAGNOSIS — Z1159 Encounter for screening for other viral diseases: Secondary | ICD-10-CM

## 2021-01-21 DIAGNOSIS — O09299 Supervision of pregnancy with other poor reproductive or obstetric history, unspecified trimester: Secondary | ICD-10-CM

## 2021-01-21 LAB — OB RESULTS CONSOLE VARICELLA ZOSTER ANTIBODY, IGG: Varicella: IMMUNE

## 2021-01-21 NOTE — Progress Notes (Signed)
ROB - dating scan. Procedure RM 

## 2021-01-27 LAB — URINE CULTURE

## 2021-01-27 LAB — HEPATITIS C ANTIBODY: Hep C Virus Ab: <0.1 s/co ratio (ref 0.0–0.9)

## 2021-01-27 LAB — RPR+RH+ABO+RUB AB+AB SCR+CB...
Antibody Screen: NEGATIVE
HIV Screen 4th Generation wRfx: NONREACTIVE
Hematocrit: 31.9 % — ABNORMAL LOW (ref 34.0–46.6)
Hemoglobin: 11.2 g/dL (ref 11.1–15.9)
Hepatitis B Surface Ag: NEGATIVE
MCH: 31.8 pg (ref 26.6–33.0)
MCHC: 35.1 g/dL (ref 31.5–35.7)
MCV: 91 fL (ref 79–97)
Platelets: 362 10*3/uL (ref 150–450)
RBC: 3.52 x10E6/uL — ABNORMAL LOW (ref 3.77–5.28)
RDW: 13 % (ref 11.7–15.4)
RPR Ser Ql: NONREACTIVE
Rh Factor: NEGATIVE
Rubella Antibodies, IGG: 3.87 index (ref >0.99)
Varicella zoster IgG: 491 index (ref >165)
WBC: 15.1 10*3/uL — ABNORMAL HIGH (ref 3.4–10.8)

## 2021-01-27 LAB — PROTEIN / CREATININE RATIO, URINE
Creatinine, Urine: 157 mg/dL
Protein, Ur: 18 mg/dL
Protein/Creat Ratio: 115 mg/g creat (ref 0–200)

## 2021-01-27 LAB — COMPREHENSIVE METABOLIC PANEL
ALT: 19 IU/L (ref 0–32)
AST: 20 IU/L (ref 0–40)
Albumin/Globulin Ratio: 2.6 — ABNORMAL HIGH (ref 1.2–2.2)
Albumin: 4.2 g/dL (ref 3.8–4.8)
Alkaline Phosphatase: 76 IU/L (ref 44–121)
BUN/Creatinine Ratio: 18 (ref 9–23)
BUN: 8 mg/dL (ref 6–20)
Bilirubin Total: <0.2 mg/dL (ref 0.0–1.2)
CO2: 20 mmol/L (ref 20–29)
Calcium: 8.9 mg/dL (ref 8.7–10.2)
Chloride: 101 mmol/L (ref 96–106)
Creatinine, Ser: 0.45 mg/dL — ABNORMAL LOW (ref 0.57–1.00)
Globulin, Total: 1.6 g/dL (ref 1.5–4.5)
Glucose: 112 mg/dL — ABNORMAL HIGH (ref 65–99)
Potassium: 4.2 mmol/L (ref 3.5–5.2)
Sodium: 136 mmol/L (ref 134–144)
Total Protein: 5.8 g/dL — ABNORMAL LOW (ref 6.0–8.5)
eGFR: 129 mL/min/{1.73_m2} (ref >59)

## 2021-01-27 LAB — URINE DRUG PANEL 7
Amphetamines, Urine: NEGATIVE ng/mL
Barbiturate Quant, Ur: NEGATIVE ng/mL
Benzodiazepine Quant, Ur: NEGATIVE ng/mL
Cannabinoid Quant, Ur: POSITIVE — AB
Cocaine (Metab.): NEGATIVE ng/mL
Opiate Quant, Ur: NEGATIVE
PCP Quant, Ur: NEGATIVE ng/mL

## 2021-01-29 LAB — CYTOLOGY - PAP
Chlamydia: NEGATIVE
Comment: NEGATIVE
Comment: NEGATIVE
Comment: NEGATIVE
Comment: NORMAL
Diagnosis: UNDETERMINED — AB
HPV 16: NEGATIVE
HPV 18 / 45: NEGATIVE
High risk HPV: POSITIVE — AB
Neisseria Gonorrhea: NEGATIVE

## 2021-01-31 NOTE — Progress Notes (Signed)
  Appointment  closed for Dr. Bonney Aid.     Routine Prenatal Care Visit  Subjective  Michelle Kelley is a 35 y.o. [phone] at [redacted]w[redacted]d being seen today for ongoing prenatal care.  She is currently monitored for the following issues for this high-risk pregnancy and has Migraine; Episodic mood disorder (HCC); Schizophrenia (HCC); Manic depressive disorder (HCC); Obesity; Tobacco user; Substance use disorder; Marijuana abuse; and Supervision of high risk pregnancy in second trimester on their problem list.  ----------------------------------------------------------------------------------- Patient reports no complaints.    .  .   . Denies leaking of fluid.  ----------------------------------------------------------------------------------- The following portions of the patient's history were reviewed and updated as appropriate: allergies, current medications, past family history, past medical history, past social history, past surgical history and problem list. Problem list updated.   Objective  Blood pressure 131/84, weight 160 lb (72.6 kg), last menstrual period 09/18/2020. Pregravid weight 140 lb (63.5 kg) Total Weight Gain 20 lb (9.072 kg) Urinalysis:      Fetal Status:           General:  Alert, oriented and cooperative. Patient is in no acute distress.  Skin: Skin is warm and dry. No rash noted.   Cardiovascular: Normal heart rate noted  Respiratory: Normal respiratory effort, no problems with respiration noted  Abdomen: Soft, gravid, appropriate for gestational age.       Pelvic:  Cervical exam deferred        Extremities: Normal range of motion.     ental Status: Normal mood and affect. Normal behavior. Normal judgment and thought content.     Assessment   35 y.o. [id number] at [redacted]w[redacted]d by  06/25/2021, by Last Menstrual Period presenting for routine prenatal visit  Plan   pregnancy 5 Problems (from 01/13/21 to present)     Problem Noted Resolved   Supervision of high risk  pregnancy in second trimester 01/14/2021 by Tresea Mall, CNM No   Overview Addendum 01/15/2021  3:57 PM by Tresea Mall, CNM     Nursing Staff Provider  Office Location  [facility]ating    Language  English Anatomy US    Flu Vaccine   Genetic Screen  NIPS:   TDaP vaccine    Hgb A1C or  GTT Early : NA Third trimester :   Covid    LAB RESULTS   Rhogam   Blood Type     Feeding Plan Formula Antibody    Contraception  Rubella    Circumcision  RPR     Pediatrician   HBsAg     Support Person Casimiro Needle HIV    Prenatal Classes  Varicella     GBS  (For PCN allergy, check sensitivities)   BTL Consent     VBAC Consent  Pap      Hgb Electro    Pelvis Tested  CF      SMA      Hx gastric sleeve/has anxiety              Gestational age appropriate obstetric precautions including but not limited to vaginal bleeding, contractions, leaking of fluid and fetal movement were reviewed in detail with the patient.    Return in about 4 weeks (around 02/18/2021) for ROB.  Vena Austria, MD, [facility], [facility] 01/31/2021, 1:45 PM

## 2021-02-08 ENCOUNTER — Other Ambulatory Visit: Payer: Self-pay | Admitting: Advanced Practice Midwife

## 2021-02-08 DIAGNOSIS — Z3689 Encounter for other specified antenatal screening: Secondary | ICD-10-CM

## 2021-02-08 DIAGNOSIS — O99842 Bariatric surgery status complicating pregnancy, second trimester: Secondary | ICD-10-CM

## 2021-02-08 DIAGNOSIS — O09899 Supervision of other high risk pregnancies, unspecified trimester: Secondary | ICD-10-CM

## 2021-02-08 DIAGNOSIS — O99340 Other mental disorders complicating pregnancy, unspecified trimester: Secondary | ICD-10-CM

## 2021-02-08 DIAGNOSIS — O09292 Supervision of pregnancy with other poor reproductive or obstetric history, second trimester: Secondary | ICD-10-CM

## 2021-02-08 DIAGNOSIS — Z98891 History of uterine scar from previous surgery: Secondary | ICD-10-CM

## 2021-02-08 DIAGNOSIS — O0992 Supervision of high risk pregnancy, unspecified, second trimester: Secondary | ICD-10-CM

## 2021-02-08 DIAGNOSIS — F419 Anxiety disorder, unspecified: Secondary | ICD-10-CM

## 2021-02-08 DIAGNOSIS — Z369 Encounter for antenatal screening, unspecified: Secondary | ICD-10-CM

## 2021-02-11 ENCOUNTER — Other Ambulatory Visit: Payer: Self-pay

## 2021-02-11 ENCOUNTER — Ambulatory Visit: Payer: No Typology Code available for payment source | Attending: Maternal & Fetal Medicine

## 2021-02-11 VITALS — BP 127/83 | HR 94 | Temp 98.3°F | Ht 62.0 in | Wt 158.5 lb

## 2021-02-11 DIAGNOSIS — F419 Anxiety disorder, unspecified: Secondary | ICD-10-CM | POA: Insufficient documentation

## 2021-02-11 DIAGNOSIS — O99332 Smoking (tobacco) complicating pregnancy, second trimester: Secondary | ICD-10-CM | POA: Diagnosis not present

## 2021-02-11 DIAGNOSIS — O09292 Supervision of pregnancy with other poor reproductive or obstetric history, second trimester: Secondary | ICD-10-CM | POA: Diagnosis not present

## 2021-02-11 DIAGNOSIS — O34219 Maternal care for unspecified type scar from previous cesarean delivery: Secondary | ICD-10-CM | POA: Insufficient documentation

## 2021-02-11 DIAGNOSIS — O99320 Drug use complicating pregnancy, unspecified trimester: Secondary | ICD-10-CM | POA: Insufficient documentation

## 2021-02-11 DIAGNOSIS — F1910 Other psychoactive substance abuse, uncomplicated: Secondary | ICD-10-CM | POA: Diagnosis not present

## 2021-02-11 DIAGNOSIS — O09892 Supervision of other high risk pregnancies, second trimester: Secondary | ICD-10-CM

## 2021-02-11 DIAGNOSIS — Z903 Acquired absence of stomach [part of]: Secondary | ICD-10-CM

## 2021-02-11 DIAGNOSIS — O0992 Supervision of high risk pregnancy, unspecified, second trimester: Secondary | ICD-10-CM | POA: Insufficient documentation

## 2021-02-11 DIAGNOSIS — F17200 Nicotine dependence, unspecified, uncomplicated: Secondary | ICD-10-CM | POA: Insufficient documentation

## 2021-02-11 DIAGNOSIS — Z3689 Encounter for other specified antenatal screening: Secondary | ICD-10-CM | POA: Insufficient documentation

## 2021-02-11 DIAGNOSIS — Z98891 History of uterine scar from previous surgery: Secondary | ICD-10-CM

## 2021-02-11 DIAGNOSIS — O99340 Other mental disorders complicating pregnancy, unspecified trimester: Secondary | ICD-10-CM | POA: Insufficient documentation

## 2021-02-11 DIAGNOSIS — O99842 Bariatric surgery status complicating pregnancy, second trimester: Secondary | ICD-10-CM | POA: Insufficient documentation

## 2021-02-11 DIAGNOSIS — O99322 Drug use complicating pregnancy, second trimester: Secondary | ICD-10-CM | POA: Insufficient documentation

## 2021-02-11 DIAGNOSIS — O99342 Other mental disorders complicating pregnancy, second trimester: Secondary | ICD-10-CM | POA: Insufficient documentation

## 2021-02-11 DIAGNOSIS — Z369 Encounter for antenatal screening, unspecified: Secondary | ICD-10-CM | POA: Diagnosis not present

## 2021-02-11 DIAGNOSIS — O09899 Supervision of other high risk pregnancies, unspecified trimester: Secondary | ICD-10-CM

## 2021-02-11 DIAGNOSIS — Z3A20 20 weeks gestation of pregnancy: Secondary | ICD-10-CM | POA: Diagnosis not present

## 2021-02-11 DIAGNOSIS — O09299 Supervision of pregnancy with other poor reproductive or obstetric history, unspecified trimester: Secondary | ICD-10-CM

## 2021-02-12 ENCOUNTER — Encounter: Payer: No Typology Code available for payment source | Admitting: Obstetrics

## 2021-02-25 ENCOUNTER — Other Ambulatory Visit: Payer: Self-pay

## 2021-02-25 ENCOUNTER — Encounter: Payer: PRIVATE HEALTH INSURANCE | Admitting: Obstetrics and Gynecology

## 2021-02-25 NOTE — Progress Notes (Signed)
Error. Patient rescheduling

## 2021-03-08 ENCOUNTER — Other Ambulatory Visit: Payer: Self-pay | Admitting: Maternal & Fetal Medicine

## 2021-03-08 ENCOUNTER — Other Ambulatory Visit: Payer: Self-pay

## 2021-03-08 ENCOUNTER — Ambulatory Visit (INDEPENDENT_AMBULATORY_CARE_PROVIDER_SITE_OTHER): Payer: No Typology Code available for payment source | Admitting: Obstetrics and Gynecology

## 2021-03-08 VITALS — BP 118/72 | Wt 163.0 lb

## 2021-03-08 DIAGNOSIS — O4442 Low lying placenta NOS or without hemorrhage, second trimester: Secondary | ICD-10-CM

## 2021-03-08 DIAGNOSIS — O10019 Pre-existing essential hypertension complicating pregnancy, unspecified trimester: Secondary | ICD-10-CM

## 2021-03-08 DIAGNOSIS — Z3A24 24 weeks gestation of pregnancy: Secondary | ICD-10-CM

## 2021-03-08 DIAGNOSIS — O0992 Supervision of high risk pregnancy, unspecified, second trimester: Secondary | ICD-10-CM

## 2021-03-08 DIAGNOSIS — Z369 Encounter for antenatal screening, unspecified: Secondary | ICD-10-CM

## 2021-03-08 DIAGNOSIS — Z9884 Bariatric surgery status: Secondary | ICD-10-CM

## 2021-03-08 NOTE — Progress Notes (Signed)
    Routine Prenatal Care Visit  Subjective  Michelle Kelley is a 35 y.o. [phone] at [redacted]w[redacted]d being seen today for ongoing prenatal care.  She is currently monitored for the following issues for this high-risk pregnancy and has Migraine; Episodic mood disorder (HCC); Schizophrenia (HCC); Manic depressive disorder (HCC); Obesity; Tobacco user; Substance use disorder; Marijuana abuse; and Supervision of high risk pregnancy in second trimester on their problem list.  ----------------------------------------------------------------------------------- Patient reports no complaints.   Contractions: Not present. Vag. Bleeding: None.  Movement: Present. Denies leaking of fluid.  ----------------------------------------------------------------------------------- The following portions of the patient's history were reviewed and updated as appropriate: allergies, current medications, past family history, past medical history, past social history, past surgical history and problem list. Problem list updated.   Objective  Blood pressure 118/72, weight 163 lb (73.9 kg), last menstrual period 09/18/2020. Pregravid weight 140 lb (63.5 kg) Total Weight Gain 23 lb (10.4 kg) Urinalysis:      Fetal Status: Fetal Heart Rate (bpm): 150 Fundal Height: 23 cm Movement: Present     General:  Alert, oriented and cooperative. Patient is in no acute distress.  Skin: Skin is warm and dry. No rash noted.   Cardiovascular: Normal heart rate noted  Respiratory: Normal respiratory effort, no problems with respiration noted  Abdomen: Soft, gravid, appropriate for gestational age. Pain/Pressure: Absent     Pelvic:  Cervical exam deferred        Extremities: Normal range of motion.     ental Status: Normal mood and affect. Normal behavior. Normal judgment and thought content.     Assessment   35 y.o. [id number] at [redacted]w[redacted]d by  06/25/2021, by Last Menstrual Period presenting for routine prenatal visit  Plan   pregnancy 5  Problems (from 01/13/21 to present)     Problem Noted Resolved   Supervision of high risk pregnancy in second trimester 01/14/2021 by Tresea Mall, CNM No   Overview Addendum 01/15/2021  3:57 PM by Tresea Mall, CNM     Nursing Staff Provider  Office Location  [facility] Dating    Language  English Anatomy US    Flu Vaccine   Genetic Screen  NIPS:   TDaP vaccine    Hgb A1C or  GTT Early : NA Third trimester :   Covid    LAB RESULTS   Rhogam   Blood Type     Feeding Plan Formula Antibody    Contraception  Rubella    Circumcision  RPR     Pediatrician   HBsAg     Support Person Casimiro Needle HIV    Prenatal Classes  Varicella     GBS  (For PCN allergy, check sensitivities)   BTL Consent     VBAC Consent  Pap      Hgb Electro    Pelvis Tested  CF      SMA      Hx gastric sleeve/has anxiety              Gestational age appropriate obstetric precautions including but not limited to vaginal bleeding, contractions, leaking of fluid and fetal movement were reviewed in detail with the patient.    Return in about 4 weeks (around 04/05/2021) for ROB and 28 week labs.  Vena Austria, MD, [facility], [facility] 03/08/2021, 12:12 PM

## 2021-03-08 NOTE — Progress Notes (Signed)
ROB - belly button has a smell to it. RM 5

## 2021-03-11 ENCOUNTER — Other Ambulatory Visit: Payer: Self-pay | Admitting: Obstetrics and Gynecology

## 2021-03-11 ENCOUNTER — Ambulatory Visit: Payer: No Typology Code available for payment source | Attending: Obstetrics and Gynecology

## 2021-03-11 ENCOUNTER — Other Ambulatory Visit: Payer: Self-pay

## 2021-03-11 DIAGNOSIS — O4442 Low lying placenta NOS or without hemorrhage, second trimester: Secondary | ICD-10-CM | POA: Diagnosis not present

## 2021-03-11 DIAGNOSIS — F17200 Nicotine dependence, unspecified, uncomplicated: Secondary | ICD-10-CM | POA: Diagnosis not present

## 2021-03-11 DIAGNOSIS — F1910 Other psychoactive substance abuse, uncomplicated: Secondary | ICD-10-CM | POA: Insufficient documentation

## 2021-03-11 DIAGNOSIS — Z9884 Bariatric surgery status: Secondary | ICD-10-CM | POA: Diagnosis not present

## 2021-03-11 DIAGNOSIS — O99842 Bariatric surgery status complicating pregnancy, second trimester: Secondary | ICD-10-CM | POA: Diagnosis not present

## 2021-03-11 DIAGNOSIS — Z98891 History of uterine scar from previous surgery: Secondary | ICD-10-CM

## 2021-03-11 DIAGNOSIS — O99322 Drug use complicating pregnancy, second trimester: Secondary | ICD-10-CM | POA: Insufficient documentation

## 2021-03-11 DIAGNOSIS — Z3A24 24 weeks gestation of pregnancy: Secondary | ICD-10-CM | POA: Diagnosis not present

## 2021-03-11 DIAGNOSIS — O34219 Maternal care for unspecified type scar from previous cesarean delivery: Secondary | ICD-10-CM | POA: Diagnosis not present

## 2021-03-11 DIAGNOSIS — O99332 Smoking (tobacco) complicating pregnancy, second trimester: Secondary | ICD-10-CM | POA: Diagnosis not present

## 2021-04-05 ENCOUNTER — Ambulatory Visit (INDEPENDENT_AMBULATORY_CARE_PROVIDER_SITE_OTHER): Payer: No Typology Code available for payment source | Admitting: Obstetrics and Gynecology

## 2021-04-05 ENCOUNTER — Other Ambulatory Visit: Payer: PRIVATE HEALTH INSURANCE

## 2021-04-05 ENCOUNTER — Other Ambulatory Visit: Payer: Self-pay

## 2021-04-05 VITALS — BP 128/82 | Wt 175.0 lb

## 2021-04-05 DIAGNOSIS — O26893 Other specified pregnancy related conditions, third trimester: Secondary | ICD-10-CM | POA: Diagnosis not present

## 2021-04-05 DIAGNOSIS — O99210 Obesity complicating pregnancy, unspecified trimester: Secondary | ICD-10-CM

## 2021-04-05 DIAGNOSIS — O360130 Maternal care for anti-D [Rh] antibodies, third trimester, not applicable or unspecified: Secondary | ICD-10-CM | POA: Diagnosis not present

## 2021-04-05 DIAGNOSIS — O0992 Supervision of high risk pregnancy, unspecified, second trimester: Secondary | ICD-10-CM

## 2021-04-05 DIAGNOSIS — O10019 Pre-existing essential hypertension complicating pregnancy, unspecified trimester: Secondary | ICD-10-CM

## 2021-04-05 DIAGNOSIS — Z3A28 28 weeks gestation of pregnancy: Secondary | ICD-10-CM

## 2021-04-05 DIAGNOSIS — Z6791 Unspecified blood type, Rh negative: Secondary | ICD-10-CM | POA: Diagnosis not present

## 2021-04-05 DIAGNOSIS — Z369 Encounter for antenatal screening, unspecified: Secondary | ICD-10-CM

## 2021-04-05 LAB — POCT URINALYSIS DIPSTICK OB
Glucose, UA: NEGATIVE
POC,PROTEIN,UA: NEGATIVE

## 2021-04-05 MED ORDER — ONDANSETRON 4 MG PO TBDP: 4.0000 mg | ORAL_TABLET | Freq: Four times a day (QID) | ORAL | 0 refills | Status: DC | PRN

## 2021-04-05 MED ORDER — RHO D IMMUNE GLOBULIN 1500 UNIT/2ML IJ SOSY
300.0000 ug | PREFILLED_SYRINGE | Freq: Once | INTRAMUSCULAR | Status: AC
  Administered 2021-04-05: 300 ug via INTRAMUSCULAR

## 2021-04-05 NOTE — Progress Notes (Signed)
ROB - vomiting

## 2021-04-05 NOTE — Addendum Note (Signed)
Addended by: Fortunato Curling R on: 04/05/2021 11:05 AM   Modules accepted: Orders

## 2021-04-05 NOTE — Progress Notes (Signed)
    Routine Prenatal Care Visit  Subjective  Michelle Kelley is a 35 y.o. [phone] at [redacted]w[redacted]d being seen today for ongoing prenatal care.  She is currently monitored for the following issues for this high-risk pregnancy and has Migraine; Episodic mood disorder (HCC); Schizophrenia (HCC); Manic depressive disorder (HCC); Obesity; Tobacco user; Substance use disorder; Marijuana abuse; and Supervision of high risk pregnancy in second trimester on their problem list.  ----------------------------------------------------------------------------------- Patient reports nausea and vomiting.  Does report some pica for ice chips Contractions: Not present. Vag. Bleeding: None.  Movement: Present. Denies leaking of fluid.  ----------------------------------------------------------------------------------- The following portions of the patient's history were reviewed and updated as appropriate: allergies, current medications, past family history, past medical history, past social history, past surgical history and problem list. Problem list updated.   Objective  Blood pressure 128/82, weight 175 lb (79.4 kg), last menstrual period 09/18/2020. Pregravid weight 140 lb (63.5 kg) Total Weight Gain 35 lb (15.9 kg) Urinalysis:      Fetal Status: Fetal Heart Rate (bpm): 155 Fundal Height: 28 cm Movement: Present  Presentation: Vertex  General:  Alert, oriented and cooperative. Patient is in no acute distress.  Skin: Skin is warm and dry. No rash noted.   Cardiovascular: Normal heart rate noted  Respiratory: Normal respiratory effort, no problems with respiration noted  Abdomen: Soft, gravid, appropriate for gestational age. Pain/Pressure: Absent     Pelvic:  Cervical exam deferred        Extremities: Normal range of motion.     ental Status: Normal mood and affect. Normal behavior. Normal judgment and thought content.     Assessment   35 y.o. [id number] at [redacted]w[redacted]d by  06/25/2021, by Last Menstrual Period  presenting for routine prenatal visit  Plan   pregnancy 5 Problems (from 01/13/21 to present)     Problem Noted Resolved   Supervision of high risk pregnancy in second trimester 01/14/2021 by Tresea Mall, CNM No   Overview Addendum 01/15/2021  3:57 PM by Tresea Mall, CNM     Nursing Staff Provider  Office Location  [facility] Dating    Language  English Anatomy US    Flu Vaccine   Genetic Screen  NIPS:   TDaP vaccine    Hgb A1C or  GTT Early : NA Third trimester :   Covid    LAB RESULTS   Rhogam   Blood Type     Feeding Plan Formula Antibody    Contraception  Rubella    Circumcision  RPR     Pediatrician   HBsAg     Support Person Casimiro Needle HIV    Prenatal Classes  Varicella     GBS  (For PCN allergy, check sensitivities)   BTL Consent     VBAC Consent  Pap      Hgb Electro    Pelvis Tested  CF      SMA      Hx gastric sleeve/has anxiety              Gestational age appropriate obstetric precautions including but not limited to vaginal bleeding, contractions, leaking of fluid and fetal movement were reviewed in detail with the patient.    - 28 week labs and rhogam today - Zofran ODT for nausea  Return in about 2 weeks (around 04/19/2021) for ROB every 2 weeks for the next 8 weeks.  Vena Austria, MD, Evern Core [facility], [facility] 04/05/2021, 10:27 AM

## 2021-04-06 LAB — 28 WEEKS RH-PANEL
Antibody Screen: NEGATIVE
Basophils Absolute: 0.1 10*3/uL (ref 0.0–0.2)
Basos: 0 %
EOS (ABSOLUTE): 0.2 10*3/uL (ref 0.0–0.4)
Eos: 1 %
Gestational Diabetes Screen: 71 mg/dL (ref 70–139)
HIV Screen 4th Generation wRfx: NONREACTIVE
Hematocrit: 27.4 % — ABNORMAL LOW (ref 34.0–46.6)
Hemoglobin: 9.1 g/dL — ABNORMAL LOW (ref 11.1–15.9)
Immature Grans (Abs): 1 10*3/uL — ABNORMAL HIGH (ref 0.0–0.1)
Immature Granulocytes: 5 %
Lymphocytes Absolute: 2.6 10*3/uL (ref 0.7–3.1)
Lymphs: 13 %
MCH: 29.7 pg (ref 26.6–33.0)
MCHC: 33.2 g/dL (ref 31.5–35.7)
MCV: 90 fL (ref 79–97)
Monocytes Absolute: 1.1 10*3/uL — ABNORMAL HIGH (ref 0.1–0.9)
Monocytes: 6 %
Neutrophils Absolute: 14.2 10*3/uL — ABNORMAL HIGH (ref 1.4–7.0)
Neutrophils: 75 %
Platelets: 340 10*3/uL (ref 150–450)
RBC: 3.06 x10E6/uL — ABNORMAL LOW (ref 3.77–5.28)
RDW: 13.6 % (ref 11.7–15.4)
RPR Ser Ql: NONREACTIVE
WBC: 19.1 10*3/uL — ABNORMAL HIGH (ref 3.4–10.8)

## 2021-04-19 ENCOUNTER — Ambulatory Visit: Payer: No Typology Code available for payment source | Attending: Obstetrics and Gynecology

## 2021-04-19 ENCOUNTER — Other Ambulatory Visit: Payer: Self-pay | Admitting: *Deleted

## 2021-04-19 ENCOUNTER — Other Ambulatory Visit: Payer: Self-pay | Admitting: Obstetrics

## 2021-04-19 ENCOUNTER — Other Ambulatory Visit: Payer: Self-pay | Admitting: Advanced Practice Midwife

## 2021-04-19 ENCOUNTER — Other Ambulatory Visit: Payer: Self-pay

## 2021-04-19 DIAGNOSIS — O0992 Supervision of high risk pregnancy, unspecified, second trimester: Secondary | ICD-10-CM

## 2021-04-19 DIAGNOSIS — O0990 Supervision of high risk pregnancy, unspecified, unspecified trimester: Secondary | ICD-10-CM | POA: Insufficient documentation

## 2021-04-19 DIAGNOSIS — O4443 Low lying placenta NOS or without hemorrhage, third trimester: Secondary | ICD-10-CM

## 2021-04-19 DIAGNOSIS — O0993 Supervision of high risk pregnancy, unspecified, third trimester: Secondary | ICD-10-CM

## 2021-04-19 DIAGNOSIS — O34219 Maternal care for unspecified type scar from previous cesarean delivery: Secondary | ICD-10-CM

## 2021-04-19 DIAGNOSIS — O99843 Bariatric surgery status complicating pregnancy, third trimester: Secondary | ICD-10-CM

## 2021-04-19 DIAGNOSIS — O99333 Smoking (tobacco) complicating pregnancy, third trimester: Secondary | ICD-10-CM | POA: Diagnosis not present

## 2021-04-19 DIAGNOSIS — O09293 Supervision of pregnancy with other poor reproductive or obstetric history, third trimester: Secondary | ICD-10-CM

## 2021-04-19 DIAGNOSIS — Z3A30 30 weeks gestation of pregnancy: Secondary | ICD-10-CM

## 2021-04-22 ENCOUNTER — Ambulatory Visit: Payer: No Typology Code available for payment source

## 2021-04-26 ENCOUNTER — Other Ambulatory Visit: Payer: Self-pay

## 2021-04-28 ENCOUNTER — Other Ambulatory Visit: Payer: Self-pay

## 2021-04-28 MED ORDER — ONDANSETRON 4 MG PO TBDP: 4.0000 mg | ORAL_TABLET | Freq: Four times a day (QID) | ORAL | 0 refills | Status: DC | PRN

## 2021-05-03 ENCOUNTER — Encounter: Payer: PRIVATE HEALTH INSURANCE | Admitting: Obstetrics and Gynecology

## 2021-05-04 ENCOUNTER — Telehealth: Payer: Self-pay

## 2021-05-04 ENCOUNTER — Ambulatory Visit (INDEPENDENT_AMBULATORY_CARE_PROVIDER_SITE_OTHER): Payer: No Typology Code available for payment source | Admitting: Obstetrics and Gynecology

## 2021-05-04 ENCOUNTER — Encounter: Payer: PRIVATE HEALTH INSURANCE | Admitting: Obstetrics and Gynecology

## 2021-05-04 ENCOUNTER — Other Ambulatory Visit: Payer: Self-pay

## 2021-05-04 ENCOUNTER — Encounter: Payer: Self-pay | Admitting: Obstetrics and Gynecology

## 2021-05-04 VITALS — BP 122/80 | Wt 183.0 lb

## 2021-05-04 DIAGNOSIS — O99013 Anemia complicating pregnancy, third trimester: Secondary | ICD-10-CM

## 2021-05-04 DIAGNOSIS — L299 Pruritus, unspecified: Secondary | ICD-10-CM

## 2021-05-04 DIAGNOSIS — Z23 Encounter for immunization: Secondary | ICD-10-CM | POA: Diagnosis not present

## 2021-05-04 DIAGNOSIS — O0993 Supervision of high risk pregnancy, unspecified, third trimester: Secondary | ICD-10-CM

## 2021-05-04 DIAGNOSIS — Z903 Acquired absence of stomach [part of]: Secondary | ICD-10-CM

## 2021-05-04 LAB — POCT URINALYSIS DIPSTICK OB: Glucose, UA: NEGATIVE

## 2021-05-04 NOTE — Progress Notes (Addendum)
Routine Prenatal Care Visit  Subjective  Michelle Kelley is a 35 y.o. [phone] at [redacted]w[redacted]d being seen today for ongoing prenatal care.  She is currently monitored for the following issues for this high-risk pregnancy and has Migraine; Episodic mood disorder ([place]); Schizophrenia ([place]); Manic depressive disorder ([place]); Obesity in pregnancy, antepartum; Tobacco user; Substance use disorder; Marijuana abuse; and Supervision of high-risk pregnancy, third trimester on their problem list.  ----------------------------------------------------------------------------------- Patient reports  itching and frustration with pregnancy discomforts .   Contractions: Not present. Vag. Bleeding: None.  Movement: Present. Denies leaking of fluid.  ----------------------------------------------------------------------------------- The following portions of the patient's history were reviewed and updated as appropriate: allergies, current medications, past family history, past medical history, past social history, past surgical history and problem list. Problem list updated.   Objective  Blood pressure 122/80, weight 183 lb (83 kg), last menstrual period 09/18/2020. Pregravid weight 140 lb (63.5 kg) Total Weight Gain 43 lb (19.5 kg) Urinalysis:      Fetal Status: Fetal Heart Rate (bpm): 150 Fundal Height: 30 cm Movement: Present     General:  Alert, oriented and cooperative. Patient is in no acute distress.  Skin: Skin is warm and dry. No rash noted.   Cardiovascular: Normal heart rate noted  Respiratory: Normal respiratory effort, no problems with respiration noted  Abdomen: Soft, gravid, appropriate for gestational age. Pain/Pressure: Absent     Pelvic:  Cervical exam deferred        Extremities: Normal range of motion.  Edema: None  Mental Status: Normal mood and affect. Normal behavior. Normal judgment and thought content.     Assessment   36 y.o. [id number] at [redacted]w[redacted]d by  06/25/2021, by Last Menstrual  Period presenting for routine prenatal visit  Plan   pregnancy 5 Problems (from 01/13/21 to present)     Problem Noted Resolved   Supervision of high-risk pregnancy, third trimester 01/14/2021 by Rod Can, CNM No   Overview Addendum 05/04/2021 10:40 AM by Homero Fellers, MD     Nursing Staff Provider  Office Location  [facility] Dating  lmp = 20 [place]  Language  English Anatomy US  Complete Low lying placenta, 11% growth  Flu Vaccine  05/04/2021 Genetic Screen  NIPS: declined  TDaP vaccine   05/04/2021 Hgb A1C or  GTT Early : NA Third trimester : nml- 71  Covid    LAB RESULTS   Rhogam  04/05/2021 Blood Type O/Negative/-- (08/04 1656)   Feeding Plan Formula Antibody Negative (10/17 1047)  Contraception  tubal Rubella 3.87 (08/04 1656)  Circumcision  RPR Non Reactive (10/17 1047)   Pediatrician   HBsAg Negative (08/04 1656)   Support Person Legrand Como HIV Non Reactive (10/17 1047)  Prenatal Classes  Varicella immune    GBS  (For PCN allergy, check sensitivities)   BTL Consent  05/04/2021    VBAC Consent Desires repeat Pap  01/2021 ASCUS HPV+ [ ]  COLPO    Hgb Electro    Pelvis Tested  CF      SMA      Hx gastric sleeve/has anxiety             Patient reports itching on her arms hands and feet.  She reports that this itching is worse at night keeping her up at night.  It has been going on for about 1 week.  We will check bile acid levels today.  Suspicion for hepatic cholestasis of pregnancy  Patient has anemia which is noted at  28-week labs.  Patient has history of gastric sleeve.  We will check vitamin levels today.  Provided patient with a list of supplements that she should be taking during pregnancy.  Will refer to hematology for possible iron transfusions given history of gastric sleeve and inability to tolerate oral iron.  Patient expresses today that she desires sterilization.  Tubal consents were signed.  Patient is being followed outpatient for possible IUGR.   Repeat growth ultrasound is pending.  Patient has a low-lying posterior placenta and history of prior 2 prior cesarean sections.  Surgery RLTCS scheduled of 06/17/2021.  Tdap and flu immunizations today.  ASCUS HPV abnormal Pap smear in the past patient will need colposcopy postpartum.  Discussed this with patient  Gestational age appropriate obstetric precautions including but not limited to vaginal bleeding, contractions, leaking of fluid and fetal movement were reviewed in detail with the patient.    Return in about 1 week (around 05/11/2021) for ROB with MD.  Natale Milch MD [facility], [facility] 05/04/2021, 10:41 AM

## 2021-05-04 NOTE — Telephone Encounter (Signed)
Called patient to schedule cesarean section a BTL w [facility]  DOS 06/17/21  H&P 12/20 @ 9:50   Covid testing 12/27 @ 8-10am, [facility], [address]. Advised pt to mask until DOS.  Pre-admit phone call appointment to be requested - date and time will be included on H&P paper work. Also all appointments will be updated on pt MyChart. Explained that this appointment has a call window. Based on the time scheduled will indicate if the call will be received within a 4 hour window before 1:00 or after.  Advised that pt may also receive calls from the hospital pharmacy and pre-service center.  Confirmed pt has Medicaid [employer] as primary insurance (having changed this week) Secondary insurance [facility] Aubrey Rule [id number]

## 2021-05-04 NOTE — Patient Instructions (Addendum)
Iron-Rich Diet Iron is a mineral that helps your body produce hemoglobin. Hemoglobin is a protein in red blood cells that carries oxygen to your body's tissues. Eating too little iron may cause you to feel weak and tired, and it can increase your risk of infection. Iron is naturally found in many foods, and many foods have iron added to them (are iron-fortified). You may need to follow an iron-rich diet if you do not have enough iron in your body due to certain medical conditions. The amount of iron that you need each day depends on your age, your sex, and any medical conditions you have. Follow instructions from your health care provider or a dietitian about how much iron you should eat each day. What are tips for following this plan? Reading food labels Check food labels to see how many milligrams (mg) of iron are in each serving. Cooking Cook foods in pots and pans that are made from iron. Take these steps to make it easier for your body to absorb iron from certain foods: Soak beans overnight before cooking. Soak whole grains overnight and drain them before using. Ferment flours before baking, such as by using yeast in bread dough. Meal planning When you eat foods that contain iron, you should eat them with foods that are high in vitamin C. These include oranges, peppers, tomatoes, potatoes, and mangoes. Vitamin C helps your body absorb iron. Certain foods and drinks prevent your body from absorbing iron properly. Avoid eating these foods in the same meal as iron-rich foods or with iron supplements. These foods include: Coffee, black tea, and red wine. Milk, dairy products, and foods that are high in calcium. Beans and soybeans. Whole grains. General information Take iron supplements only as told by your health care provider. An overdose of iron can be life-threatening. If you were prescribed iron supplements, take them with orange juice or a vitamin C supplement. When you eat iron-fortified  foods or take an iron supplement, you should also eat foods that naturally contain iron, such as meat, poultry, and fish. Eating naturally iron-rich foods helps your body absorb the iron that is added to other foods or contained in a supplement. Iron from animal sources is better absorbed than iron from plant sources. What foods should I eat? Fruits Prunes. Raisins. Eat fruits high in vitamin C, such as oranges, grapefruits, and strawberries, with iron-rich foods. Vegetables Spinach (cooked). Green peas. Broccoli. Fermented vegetables. Eat vegetables high in vitamin C, such as leafy greens, potatoes, bell peppers, and tomatoes, with iron-rich foods. Grains Iron-fortified breakfast cereal. Iron-fortified whole-wheat bread. Enriched rice. Sprouted grains. Meats and other proteins Beef liver. Beef. [place]. Chicken. Oysters. Shrimp. [place]. Sardines. Chickpeas. Nuts. Tofu. Pumpkin seeds. Beverages Tomato juice. Fresh orange juice. Prune juice. Hibiscus tea. Iron-fortified instant breakfast shakes. Sweets and desserts Blackstrap molasses. Seasonings and condiments Tahini. Fermented soy sauce. Other foods Wheat germ. The items listed above may not be a complete list of recommended foods and beverages. Contact a dietitian for more information. What foods should I limit? These are foods that should be limited while eating iron-rich foods as they can reduce the absorption of iron in your body. Grains Whole grains. Bran cereal. Bran flour. Meats and other proteins Soybeans. Products made from soy protein. Black beans. Lentils. Mung beans. Split peas. Dairy Milk. Cream. Cheese. Yogurt. Cottage cheese. Beverages Coffee. Black tea. Red wine. Sweets and desserts Cocoa. Chocolate. Ice cream. Seasonings and condiments Basil. Oregano. Large amounts of parsley. The items listed above  may not be a complete list of foods and beverages you should limit. Contact a dietitian for more  information. Summary Iron is a mineral that helps your body produce hemoglobin. Hemoglobin is a protein in red blood cells that carries oxygen to your body's tissues. Iron is naturally found in many foods, and many foods have iron added to them (are iron-fortified). When you eat foods that contain iron, you should eat them with foods that are high in vitamin C. Vitamin C helps your body absorb iron. Certain foods and drinks prevent your body from absorbing iron properly, such as whole grains and dairy products. You should avoid eating these foods in the same meal as iron-rich foods or with iron supplements. This information is not intended to replace advice given to you by your health care provider. Make sure you discuss any questions you have with your health care provider. Document Revised: 05/18/2020 Document Reviewed: 05/18/2020 Elsevier Patient Education  2022 [employer].   Third Trimester of Pregnancy The third trimester of pregnancy is from week 28 through week 40. This is months 7 through 9. The third trimester is a time when the unborn baby (fetus) is growing rapidly. At the end of the ninth month, the fetus is about 20 inches long and weighs 6-10 pounds. Body changes during your third trimester During the third trimester, your body will continue to go through many changes. The changes vary and generally return to normal after your baby is born. Physical changes Your weight will continue to increase. You can expect to gain 25-35 pounds (11-16 kg) by the end of the pregnancy if you begin pregnancy at a normal weight. If you are underweight, you can expect to gain 28-40 lb (about 13-18 kg), and if you are overweight, you can expect to gain 15-25 lb (about 7-11 kg). You may begin to get stretch marks on your hips, abdomen, and breasts. Your breasts will continue to grow and may hurt. A yellow fluid (colostrum) may leak from your breasts. This is the first milk you are producing for your  baby. You may have changes in your hair. These can include thickening of your hair, rapid growth, and changes in texture. Some people also have hair loss during or after pregnancy, or hair that feels dry or thin. Your belly button may stick out. You may notice more swelling in your hands, face, or ankles. Health changes You may have heartburn. You may have constipation. You may develop hemorrhoids. You may develop swollen, bulging veins (varicose veins) in your legs. You may have increased body aches in the pelvis, back, or thighs. This is due to weight gain and increased hormones that are relaxing your joints. You may have increased tingling or numbness in your hands, arms, and legs. The skin on your abdomen may also feel numb. You may feel short of breath because of your expanding uterus. Other changes You may urinate more often because the fetus is moving lower into your pelvis and pressing on your bladder. You may have more problems sleeping. This may be caused by the size of your abdomen, an increased need to urinate, and an increase in your body's metabolism. You may notice the fetus "dropping," or moving lower in your abdomen (lightening). You may have increased vaginal discharge. You may notice that you have pain around your pelvic bone as your uterus distends. Follow these instructions at home: Medicines Follow your health care provider's instructions regarding medicine use. Specific medicines may be either safe or  unsafe to take during pregnancy. Do not take any medicines unless approved by your health care provider. Take a prenatal vitamin that contains at least 600 micrograms (mcg) of folic acid. Eating and drinking Eat a healthy diet that includes fresh fruits and vegetables, whole grains, good sources of protein such as meat, eggs, or tofu, and low-fat dairy products. Avoid raw meat and unpasteurized juice, milk, and cheese. These carry germs that can harm you and your  baby. Eat 4 or 5 small meals rather than 3 large meals a day. You may need to take these actions to prevent or treat constipation: Drink enough fluid to keep your urine pale yellow. Eat foods that are high in fiber, such as beans, whole grains, and fresh fruits and vegetables. Limit foods that are high in fat and processed sugars, such as fried or sweet foods. Activity Exercise only as directed by your health care provider. Most people can continue their usual exercise routine during pregnancy. Try to exercise for 30 minutes at least 5 days a week. Stop exercising if you experience contractions in the uterus. Stop exercising if you develop pain or cramping in the lower abdomen or lower back. Avoid heavy lifting. Do not exercise if it is very hot or humid or if you are at a high altitude. If you choose to, you may continue to have sex unless your health care provider tells you not to. Relieving pain and discomfort Take frequent breaks and rest with your legs raised (elevated) if you have leg cramps or low back pain. Take warm sitz baths to soothe any pain or discomfort caused by hemorrhoids. Use hemorrhoid cream if your health care provider approves. Wear a supportive bra to prevent discomfort from breast tenderness. If you develop varicose veins: Wear support hose as told by your health care provider. Elevate your feet for 15 minutes, 3-4 times a day. Limit salt in your diet. Safety Talk to your health care provider before traveling far distances. Do not use hot tubs, steam rooms, or saunas. Wear your seat belt at all times when driving or riding in a car. Talk with your health care provider if someone is verbally or physically abusive to you. Preparing for birth To prepare for the arrival of your baby: Take prenatal classes to understand, practice, and ask questions about labor and delivery. Visit the hospital and tour the maternity area. Purchase a rear-facing car seat and make sure  you know how to install it in your car. Prepare the baby's room or sleeping area. Make sure to remove all pillows and stuffed animals from the baby's crib to prevent suffocation. General instructions Avoid cat litter boxes and soil used by cats. These carry germs that can cause birth defects in the baby. If you have a cat, ask someone to clean the litter box for you. Do not douche or use tampons. Do not use scented sanitary pads. Do not use any products that contain nicotine or tobacco, such as cigarettes, e-cigarettes, and chewing tobacco. If you need help quitting, ask your health care provider. Do not use any herbal remedies, illegal drugs, or medicines that were not prescribed to you. Chemicals in these products can harm your baby. Do not drink alcohol. You will have more frequent prenatal exams during the third trimester. During a routine prenatal visit, your health care provider will do a physical exam, perform tests, and discuss your overall health. Keep all follow-up visits. This is important. Where to find more information American Pregnancy Association:  americanpregnancy.org [employer] of [employer]ians [employer] Gynecologists: https://www.todd-brady.net/ Office on [employer] Health: MightyReward.co.nz Contact a health care provider if you have: A fever. Mild pelvic cramps, pelvic pressure, or nagging pain in your abdominal area or lower back. Vomiting or diarrhea. Bad-smelling vaginal discharge or foul-smelling urine. Pain when you urinate. A headache that does not go away when you take medicine. Visual changes or see spots in front of your eyes. Get help right away if: Your water breaks. You have regular contractions less than 5 minutes apart. You have spotting or bleeding from your vagina. You have severe abdominal pain. You have difficulty breathing. You have chest pain. You have fainting spells. You have not felt your baby move for the time period told by  your health care provider. You have new or increased pain, swelling, or redness in an arm or leg. Summary The third trimester of pregnancy is from week 28 through week 40 (months 7 through 9). You may have more problems sleeping. This can be caused by the size of your abdomen, an increased need to urinate, and an increase in your body's metabolism. You will have more frequent prenatal exams during the third trimester. Keep all follow-up visits. This is important. This information is not intended to replace advice given to you by your health care provider. Make sure you discuss any questions you have with your health care provider. Document Revised: 11/13/2019 Document Reviewed: 09/19/2019 Elsevier Patient Education  2022 [employer].    E

## 2021-05-04 NOTE — Telephone Encounter (Signed)
-----   Message from Natale Milch, MD sent at 05/04/2021 10:57 AM EST ----- L&D will not allow 3rd cesarean for 06/10/2021- instead scheduled for 06/17/2021  March Rummage  ----- Message ----- From: Natale Milch, MD Sent: 05/04/2021  10:45 AM EST To: Vena Austria, MD  Surgery Booking Request Patient Full Name:  Michelle Kelley  MRN: [id number]  DOB: 05-15-1986  Surgeon: Vena Austria, MD  Requested Surgery Date and Time: 06/10/2021 Primary Diagnosis AND Code: History of cesarean section, desire for tubal ligation Secondary Diagnosis and Code:  Surgical Procedure: Cesarean section with tubal ligation RNFA Requested?: No L&D Notification: Yes Admission Status: same day surgery Length of Surgery: 100 min Special Case Needs: No H&P: Yes Phone Interview???:  No Interpreter: No Medical Clearance:  No Special Scheduling Instructions: No Any known health/anesthesia issues, diabetes, sleep apnea, latex allergy, defibrillator/pacemaker?: No Acuity: P3   (P1 highest, P2 delay may cause harm, P3 low, elective gyn, P4 lowest) Post op follow up visits: 1 week post op

## 2021-05-11 ENCOUNTER — Encounter: Payer: Self-pay | Admitting: Obstetrics and Gynecology

## 2021-05-11 ENCOUNTER — Other Ambulatory Visit: Payer: Self-pay

## 2021-05-11 ENCOUNTER — Ambulatory Visit (INDEPENDENT_AMBULATORY_CARE_PROVIDER_SITE_OTHER): Payer: PRIVATE HEALTH INSURANCE | Admitting: Obstetrics and Gynecology

## 2021-05-11 VITALS — BP 128/80 | Wt 186.0 lb

## 2021-05-11 DIAGNOSIS — Z3A33 33 weeks gestation of pregnancy: Secondary | ICD-10-CM

## 2021-05-11 DIAGNOSIS — O0993 Supervision of high risk pregnancy, unspecified, third trimester: Secondary | ICD-10-CM

## 2021-05-11 DIAGNOSIS — O99210 Obesity complicating pregnancy, unspecified trimester: Secondary | ICD-10-CM

## 2021-05-11 NOTE — Progress Notes (Signed)
Routine Prenatal Care Visit  Subjective  Michelle Kelley is a 35 y.o. [phone] at [redacted]w[redacted]d being seen today for ongoing prenatal care.  She is currently monitored for the following issues for this high-risk pregnancy and has Migraine; Episodic mood disorder (HCC); Schizophrenia (HCC); Manic depressive disorder (HCC); Obesity in pregnancy, antepartum; Tobacco user; Substance use disorder; Marijuana abuse; and Supervision of high-risk pregnancy, third trimester on their problem list.  ----------------------------------------------------------------------------------- Patient reports notes general misery with pregnancy and continued itching.  Labs from 1 week ago not resulted yet.   Contractions: Irregular. Vag. Bleeding: None.  Movement: Present. Leaking Fluid denies.  ----------------------------------------------------------------------------------- The following portions of the patient's history were reviewed and updated as appropriate: allergies, current medications, past family history, past medical history, past social history, past surgical history and problem list. Problem list updated.  Objective  Blood pressure 128/80, weight 186 lb (84.4 kg), last menstrual period 09/18/2020. Pregravid weight 140 lb (63.5 kg) Total Weight Gain 46 lb (20.9 kg) Urinalysis: Urine Protein    Urine Glucose    Fetal Status: Fetal Heart Rate (bpm): 140 Fundal Height: 3 cm Movement: Present     General:  Alert, oriented and cooperative. Patient is in no acute distress.  Skin: Skin is warm and dry. No rash noted.   Cardiovascular: Normal heart rate noted  Respiratory: Normal respiratory effort, no problems with respiration noted  Abdomen: Soft, gravid, appropriate for gestational age. Pain/Pressure: Absent     Pelvic:  Cervical exam deferred        Extremities: Normal range of motion.     Mental Status: Normal mood and affect. Normal behavior. Normal judgment and thought content.   Assessment   35 y.o.  [id number] at [redacted]w[redacted]d by  06/25/2021, by Last Menstrual Period presenting for routine prenatal visit  Plan   pregnancy 5 Problems (from 01/13/21 to present)     Problem Noted Resolved   Supervision of high-risk pregnancy, third trimester 01/14/2021 by Tresea Mall, CNM No   Overview Addendum 05/04/2021 11:07 AM by Natale Milch, MD     Nursing Staff Provider  Office Location  [facility] Dating  lmp = 20 [place]  Language  English Anatomy US  Complete Low lying placenta, 11% growth  Flu Vaccine  05/04/2021 Genetic Screen  NIPS: declined  TDaP vaccine   05/04/2021 Hgb A1C or  GTT Early : NA Third trimester : nml- 71  Covid    LAB RESULTS   Rhogam  04/05/2021 Blood Type O/Negative/-- (08/04 1656)   Feeding Plan Formula Antibody Negative (10/17 1047)  Contraception  tubal Rubella 3.87 (08/04 1656)  Circumcision  RPR Non Reactive (10/17 1047)   Pediatrician   HBsAg Negative (08/04 1656)   Support Person Casimiro Needle HIV Non Reactive (10/17 1047)  Prenatal Classes  Varicella immune    GBS  (For PCN allergy, check sensitivities)   BTL Consent  05/04/2021    VBAC Consent Desires repeat Pap  01/2021 ASCUS HPV+ [ ]  COLPO    Hgb Electro    Pelvis Tested  CF      SMA      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 History of gastric sleeve Anemia in pregnancy Itching in pregnancy bile acids pending Mental health history Marijuana use in pregnancy           Preterm labor symptoms and  general obstetric precautions including but not limited to vaginal bleeding, contractions, leaking of fluid and fetal movement were reviewed in detail with the patient. Please refer to After Visit Summary for other counseling recommendations.   - keep previously scheduled follow up appts - labs  from 1 week ago not results. Message sent to [facility]  Return in about 2 weeks (around 05/25/2021) for [place].   Prentice Docker, MD, [facility], [facility] 05/11/2021 5:05 PM

## 2021-05-13 LAB — COMPREHENSIVE METABOLIC PANEL
ALT: 8 IU/L (ref 0–32)
AST: 12 IU/L (ref 0–40)
Albumin/Globulin Ratio: 1.4 (ref 1.2–2.2)
Albumin: 3.7 g/dL — ABNORMAL LOW (ref 3.8–4.8)
Alkaline Phosphatase: 121 IU/L (ref 44–121)
BUN/Creatinine Ratio: 20 (ref 9–23)
BUN: 11 mg/dL (ref 6–20)
Bilirubin Total: <0.2 mg/dL (ref 0.0–1.2)
CO2: 20 mmol/L (ref 20–29)
Calcium: 9.5 mg/dL (ref 8.7–10.2)
Chloride: 98 mmol/L (ref 96–106)
Creatinine, Ser: 0.56 mg/dL — ABNORMAL LOW (ref 0.57–1.00)
Globulin, Total: 2.6 g/dL (ref 1.5–4.5)
Glucose: 75 mg/dL (ref 70–99)
Potassium: 4.1 mmol/L (ref 3.5–5.2)
Sodium: 133 mmol/L — ABNORMAL LOW (ref 134–144)
Total Protein: 6.3 g/dL (ref 6.0–8.5)
eGFR: 123 mL/min/{1.73_m2} (ref >59)

## 2021-05-13 LAB — IRON AND TIBC
Iron Saturation: 4 % — CL (ref 15–55)
Iron: 38 ug/dL (ref 27–159)
Total Iron Binding Capacity: 911 ug/dL (ref 250–450)
UIBC: 873 ug/dL — ABNORMAL HIGH (ref 131–425)

## 2021-05-13 LAB — FOLATE: Folate: 2.9 ng/mL — ABNORMAL LOW (ref >3.0)

## 2021-05-13 LAB — FERRITIN: Ferritin: 10 ng/mL — ABNORMAL LOW (ref 15–150)

## 2021-05-13 LAB — VITAMIN D 25 HYDROXY (VIT D DEFICIENCY, FRACTURES): Vit D, 25-Hydroxy: 15.3 ng/mL — ABNORMAL LOW (ref 30.0–100.0)

## 2021-05-13 LAB — VITAMIN B12: Vitamin B-12: 187 pg/mL — ABNORMAL LOW (ref 232–1245)

## 2021-05-13 LAB — VITAMIN B1

## 2021-05-13 LAB — BILE ACIDS, TOTAL: Bile Acids Total: 3.5 umol/L (ref 0.0–10.0)

## 2021-05-17 ENCOUNTER — Other Ambulatory Visit: Payer: Self-pay

## 2021-05-17 ENCOUNTER — Encounter: Payer: Self-pay | Admitting: Obstetrics & Gynecology

## 2021-05-17 ENCOUNTER — Ambulatory Visit (INDEPENDENT_AMBULATORY_CARE_PROVIDER_SITE_OTHER): Payer: No Typology Code available for payment source | Admitting: Obstetrics & Gynecology

## 2021-05-17 VITALS — BP 130/80 | Wt 184.0 lb

## 2021-05-17 DIAGNOSIS — Z903 Acquired absence of stomach [part of]: Secondary | ICD-10-CM

## 2021-05-17 DIAGNOSIS — Z3A34 34 weeks gestation of pregnancy: Secondary | ICD-10-CM

## 2021-05-17 DIAGNOSIS — O0993 Supervision of high risk pregnancy, unspecified, third trimester: Secondary | ICD-10-CM

## 2021-05-17 DIAGNOSIS — O99210 Obesity complicating pregnancy, unspecified trimester: Secondary | ICD-10-CM

## 2021-05-17 LAB — POCT URINALYSIS DIPSTICK OB
Glucose, UA: NEGATIVE
POC,PROTEIN,UA: NEGATIVE

## 2021-05-17 MED ORDER — ONDANSETRON 4 MG PO TBDP: 4.0000 mg | ORAL_TABLET | Freq: Four times a day (QID) | ORAL | 0 refills | Status: DC | PRN

## 2021-05-17 NOTE — Progress Notes (Signed)
  Subjective  Fetal Movement? yes Contractions? Occas BHs Leaking Fluid? no Vaginal Bleeding? no  Objective  BP 130/80   Wt 184 lb (83.5 kg)   LMP 09/18/2020 (Approximate) Comment: lighter than normal  BMI 33.65 kg/m  General: NAD Pumonary: no increased work of breathing Abdomen: gravid, non-tender Extremities: no edema Psychiatric: mood appropriate, affect full  Assessment  35 y.o. [id number] at [redacted]w[redacted]d by  06/25/2021, by Last Menstrual Period presenting for routine prenatal visit  Plan   Problem List Items Addressed This Visit      Other   Obesity in pregnancy, antepartum   Supervision of high-risk pregnancy, third trimester - Primary  Other Visit Diagnoses    H/O gastric sleeve       [redacted] weeks gestation of pregnancy        [place] for growth tomorrow PNV, [facility] Plans CS w BTL 12/29 AMS PTL precuations discussed  pregnancy 5 Problems (from 01/13/21 to present)    Problem Noted Resolved   Supervision of high-risk pregnancy, third trimester 01/14/2021 by Tresea Mall, CNM No   Overview Addendum 05/04/2021 11:07 AM by Natale Milch, MD     Nursing Staff Provider  Office Location  [facility] Dating  lmp = 20 [place]  Language  English Anatomy US  Complete Low lying placenta, 11% growth  Flu Vaccine  05/04/2021 Genetic Screen  NIPS: declined  TDaP vaccine   05/04/2021 Hgb A1C or  GTT Early : NA Third trimester : nml- 71  Covid    LAB RESULTS   Rhogam  04/05/2021 Blood Type O/Negative/-- (08/04 1656)   Feeding Plan Formula Antibody Negative (10/17 1047)  Contraception  tubal Rubella 3.87 (08/04 1656)  Circumcision  RPR Non Reactive (10/17 1047)   Pediatrician   HBsAg Negative (08/04 1656)   Support Person Casimiro Needle HIV Non Reactive (10/17 1047)  Prenatal Classes  Varicella immune    GBS  (For PCN allergy, check sensitivities)   BTL Consent  05/04/2021    VBAC Consent Desires repeat Pap  01/2021 ASCUS HPV+ [ ]  COLPO    Hgb Electro    Pelvis Tested  CF      SMA      Hx  gastric sleeve/has anxiety    History of prior C-section history Desire for sterilization Low-lying posterior placenta History of chronic hypertension currently controlled without medication History of preeclampsia 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

## 2021-05-17 NOTE — Addendum Note (Signed)
Addended by: Cornelius Moras D on: 05/17/2021 10:29 AM   Modules accepted: Orders

## 2021-05-17 NOTE — Patient Instructions (Signed)
Braxton Hicks Contractions Contractions of the uterus can occur throughout pregnancy, but they are not always a sign that you are in labor. You may have practice contractions called Braxton Hicks contractions. These false labor contractions are sometimes confused with true labor. What are Braxton Hicks contractions? Braxton Hicks contractions are tightening movements that occur in the muscles of the uterus before labor. Unlike true labor contractions, these contractions do not result in opening (dilation) and thinning of the lowest part of the uterus (cervix). Toward the end of pregnancy (32-34 weeks), Braxton Hicks contractions can happen more often and may become stronger. These contractions are sometimes difficult to tell apart from true labor because they can be very uncomfortable. How to tell the difference between true labor and false labor True labor Contractions last 30-70 seconds. Contractions become very regular. Discomfort is usually felt in the top of the uterus, and it spreads to the lower abdomen and low back. Contractions do not go away with walking. Contractions usually become stronger and more frequent. The cervix dilates and gets thinner. False labor Contractions are usually shorter, weaker, and farther apart than true labor contractions. Contractions are usually irregular. Contractions are often felt in the front of the lower abdomen and in the groin. Contractions may go away when you walk around or change positions while lying down. The cervix usually does not dilate or become thin. Sometimes, the only way to tell if you are in true labor is for your health care provider to look for changes in your cervix. Your health care provider will do a physical exam and may monitor your contractions. If you are in true labor, your health care provider will send you home with instructions about when to return to the hospital. You may continue to have Braxton Hicks contractions until you  go into true labor. Follow these instructions at home:  Take over-the-counter and prescription medicines only as told by your health care provider. If Braxton Hicks contractions are making you uncomfortable: Change your position from lying down or resting to walking, or change from walking to resting. Sit and rest in a tub of warm water. Drink enough fluid to keep your urine pale yellow. Dehydration may cause these contractions. Do slow and deep breathing several times an hour. Keep all follow-up visits. This is important. Contact a health care provider if: You have a fever. You have continuous pain in your abdomen. Your contractions become stronger, more regular, and closer together. You pass blood-tinged mucus. Get help right away if: You have fluid leaking or gushing from your vagina. You have bright red blood coming from your vagina. Your baby is not moving inside you as much as it used to. Summary You may have practice contractions called Braxton Hicks contractions. These false labor contractions are sometimes confused with true labor. Braxton Hicks contractions are usually shorter, weaker, farther apart, and less regular than true labor contractions. True labor contractions usually become stronger, more regular, and more frequent. Manage discomfort from Braxton Hicks contractions by changing position, resting in a warm bath, practicing deep breathing, and drinking plenty of water. Keep all follow-up visits. Contact your health care provider if your contractions become stronger, more regular, and closer together. This information is not intended to replace advice given to you by your health care provider. Make sure you discuss any questions you have with your health care provider. Document Revised: 04/13/2020 Document Reviewed: 04/13/2020 Elsevier Patient Education  2022 Elsevier Inc.  

## 2021-05-18 ENCOUNTER — Ambulatory Visit: Payer: No Typology Code available for payment source | Attending: Obstetrics and Gynecology

## 2021-05-18 ENCOUNTER — Ambulatory Visit: Payer: No Typology Code available for payment source | Admitting: *Deleted

## 2021-05-18 ENCOUNTER — Encounter: Payer: Self-pay | Admitting: *Deleted

## 2021-05-18 VITALS — BP 132/95 | HR 107

## 2021-05-18 DIAGNOSIS — O34219 Maternal care for unspecified type scar from previous cesarean delivery: Secondary | ICD-10-CM | POA: Diagnosis not present

## 2021-05-18 DIAGNOSIS — O99323 Drug use complicating pregnancy, third trimester: Secondary | ICD-10-CM | POA: Diagnosis not present

## 2021-05-18 DIAGNOSIS — O0993 Supervision of high risk pregnancy, unspecified, third trimester: Secondary | ICD-10-CM | POA: Insufficient documentation

## 2021-05-18 DIAGNOSIS — O4443 Low lying placenta NOS or without hemorrhage, third trimester: Secondary | ICD-10-CM | POA: Insufficient documentation

## 2021-05-18 DIAGNOSIS — Z3A34 34 weeks gestation of pregnancy: Secondary | ICD-10-CM | POA: Diagnosis not present

## 2021-05-18 DIAGNOSIS — O99333 Smoking (tobacco) complicating pregnancy, third trimester: Secondary | ICD-10-CM | POA: Diagnosis not present

## 2021-05-18 DIAGNOSIS — O99843 Bariatric surgery status complicating pregnancy, third trimester: Secondary | ICD-10-CM | POA: Insufficient documentation

## 2021-05-18 DIAGNOSIS — F1910 Other psychoactive substance abuse, uncomplicated: Secondary | ICD-10-CM | POA: Insufficient documentation

## 2021-05-18 DIAGNOSIS — O09293 Supervision of pregnancy with other poor reproductive or obstetric history, third trimester: Secondary | ICD-10-CM | POA: Diagnosis not present

## 2021-05-19 ENCOUNTER — Inpatient Hospital Stay: Payer: No Typology Code available for payment source | Attending: Oncology | Admitting: Oncology

## 2021-05-19 ENCOUNTER — Inpatient Hospital Stay: Payer: No Typology Code available for payment source

## 2021-05-19 ENCOUNTER — Encounter: Payer: Self-pay | Admitting: Oncology

## 2021-05-19 ENCOUNTER — Other Ambulatory Visit: Payer: Self-pay

## 2021-05-19 VITALS — BP 134/88 | HR 123 | Temp 97.6°F | Resp 18 | Wt 181.8 lb

## 2021-05-19 DIAGNOSIS — D508 Other iron deficiency anemias: Secondary | ICD-10-CM | POA: Insufficient documentation

## 2021-05-19 DIAGNOSIS — D72829 Elevated white blood cell count, unspecified: Secondary | ICD-10-CM | POA: Insufficient documentation

## 2021-05-19 DIAGNOSIS — Z720 Tobacco use: Secondary | ICD-10-CM | POA: Diagnosis not present

## 2021-05-19 DIAGNOSIS — O99013 Anemia complicating pregnancy, third trimester: Secondary | ICD-10-CM | POA: Diagnosis not present

## 2021-05-19 DIAGNOSIS — E538 Deficiency of other specified B group vitamins: Secondary | ICD-10-CM | POA: Diagnosis not present

## 2021-05-19 DIAGNOSIS — D509 Iron deficiency anemia, unspecified: Secondary | ICD-10-CM

## 2021-05-19 DIAGNOSIS — Z9884 Bariatric surgery status: Secondary | ICD-10-CM | POA: Diagnosis not present

## 2021-05-19 DIAGNOSIS — F17210 Nicotine dependence, cigarettes, uncomplicated: Secondary | ICD-10-CM | POA: Diagnosis not present

## 2021-05-19 HISTORY — DX: Iron deficiency anemia, unspecified: D50.9

## 2021-05-19 LAB — CBC WITH DIFFERENTIAL/PLATELET
Abs Immature Granulocytes: 1.06 10*3/uL — ABNORMAL HIGH (ref 0.00–0.07)
Basophils Absolute: 0.1 10*3/uL (ref 0.0–0.1)
Basophils Relative: 1 %
Eosinophils Absolute: 0.1 10*3/uL (ref 0.0–0.5)
Eosinophils Relative: 1 %
HCT: 28.2 % — ABNORMAL LOW (ref 36.0–46.0)
Hemoglobin: 9.1 g/dL — ABNORMAL LOW (ref 12.0–15.0)
Immature Granulocytes: 6 %
Lymphocytes Relative: 13 %
Lymphs Abs: 2.4 10*3/uL (ref 0.7–4.0)
MCH: 26.4 pg (ref 26.0–34.0)
MCHC: 32.3 g/dL (ref 30.0–36.0)
MCV: 81.7 fL (ref 80.0–100.0)
Monocytes Absolute: 0.9 10*3/uL (ref 0.1–1.0)
Monocytes Relative: 5 %
Neutro Abs: 14 10*3/uL — ABNORMAL HIGH (ref 1.7–7.7)
Neutrophils Relative %: 74 %
Platelets: 351 10*3/uL (ref 150–400)
RBC: 3.45 MIL/uL — ABNORMAL LOW (ref 3.87–5.11)
RDW: 16.3 % — ABNORMAL HIGH (ref 11.5–15.5)
Smear Review: NORMAL
WBC: 18.6 10*3/uL — ABNORMAL HIGH (ref 4.0–10.5)
nRBC: 0.3 % — ABNORMAL HIGH (ref 0.0–0.2)

## 2021-05-19 LAB — TECHNOLOGIST SMEAR REVIEW: Plt Morphology: ADEQUATE

## 2021-05-19 MED ORDER — FOLIC ACID 1 MG PO TABS: 1.0000 mg | ORAL_TABLET | Freq: Every day | ORAL | 0 refills | Status: DC

## 2021-05-19 NOTE — Progress Notes (Signed)
Hematology/Oncology Consult note [phone] [phone]      Patient Care Team: Patient, No Pcp Per (Inactive) as PCP - General (General Practice)  REFERRING PROVIDER: Jerene Pitch, Christanna R, *  CHIEF COMPLAINTS/REASON FOR VISIT:  Evaluation of anemia in pregnancy  HISTORY OF PRESENTING ILLNESS:   Michelle Kelley is a  35 y.o.  female with PMH listed below was seen in consultation at the request of  Schuman, Christanna R, *  for evaluation of anemia in pregnancy  Patient currently is in third trimester pregnancy. Patient has a chronic history of anemia. 04/05/2021, hemoglobin 9.1, MCV 90.  WBC 19.1, folate, 2.9, ferritin 10, vitamin B12 187, TIBC 911, iron 38, iron saturation 4, vitamin D 25 hydroxy 15.3. Patient has a history of gastric sleeve resection. Patient is current everyday smoker, half a pack per day.  She reports that she has chronic history of elevated white count. Patient reports history of asthma.  She does not have a primary care provider.  She usually goes to urgent care if she has asthma flare.  Review of Systems  Constitutional:  Positive for fatigue. Negative for appetite change, chills and fever.  HENT:   Negative for hearing loss and voice change.   Eyes:  Negative for eye problems.  Respiratory:  Negative for chest tightness and cough.   Cardiovascular:  Negative for chest pain.  Gastrointestinal:  Negative for abdominal distention, abdominal pain and blood in stool.  Endocrine: Negative for hot flashes.  Genitourinary:  Negative for difficulty urinating and frequency.   Musculoskeletal:  Negative for arthralgias.  Skin:  Negative for itching and rash.  Neurological:  Negative for extremity weakness.  Hematological:  Negative for adenopathy.  Psychiatric/Behavioral:  Negative for confusion.    MEDICAL HISTORY:  Past Medical History:  Diagnosis Date   Anxiety    Asthma    Complication of anesthesia    I have a hard time coming  out of general anesthesia, if I come out of it too fast, I get violent, I have to be eased out of it.   COPD (chronic obstructive pulmonary disease) (HCC)    HSV-2 (herpes simplex virus 2) infection    Hypertension    no meds   Iron deficiency anemia 05/19/2021   Manic depression (HCC)    Menorrhagia    Migraine    Postoperative anemia due to acute blood loss 11/29/2015   Due to blood loss due to cesarean delivery    PTSD (post-traumatic stress disorder)    Schizophrenia, schizo-affective (HCC)    Vaginal Pap smear, abnormal     SURGICAL HISTORY: Past Surgical History:  Procedure Laterality Date   ABDOMINAL SURGERY     CARPAL TUNNEL RELEASE     CESAREAN SECTION     CESAREAN SECTION N/A 11/26/2015   Procedure: CESAREAN SECTION;  Surgeon: Elenora Fender Ward, MD;  Location: [facility] ORS;  Service: Obstetrics;  Laterality: N/A;   cosmetic repairs     left arm, right knee and back.   LAPAROSCOPIC GASTRIC SLEEVE RESECTION     PANNICULECTOMY     TONSILLECTOMY      SOCIAL HISTORY: Social History   Socioeconomic History   Marital status: Media planner    Spouse name: Not on file   Number of children: Not on file   Years of education: Not on file   Highest education level: Not on file  Occupational History   Not on file  Tobacco Use   Smoking status: Every Day  Packs/day: 0.50    Types: Cigarettes   Smokeless tobacco: Never  Vaping Use   Vaping Use: Never used  Substance and Sexual Activity   Alcohol use: Not Currently   Drug use: Not Currently    Types: Marijuana, Cocaine    Comment: Molly in past.   Sexual activity: Yes  Other Topics Concern   Not on file  Social History Narrative   Not on file   Social Determinants of Health   Financial Resource Strain: Not on file  Food Insecurity: Not on file  Transportation Needs: Not on file  Physical Activity: Not on file  Stress: Not on file  Social Connections: Not on file  Intimate Partner Violence: Not At Risk   Fear  of Current or Ex-Partner: No   Emotionally Abused: No   Physically Abused: No   Sexually Abused: No    FAMILY HISTORY: Family History  Problem Relation Age of Onset   Cancer Mother    Hypertension Mother    Heart disease Mother    Diabetes Mother    Lung disease Mother    Clotting disorder Mother    Bipolar disorder Mother    Mental illness Sister        SCHIZOPHRENIA    ALLERGIES:  is allergic to petroleum distillate, amitriptyline, eggs or egg-derived products, latex, nsaids, penicillins, and sulfa antibiotics.  MEDICATIONS:  Current Outpatient Medications  Medication Sig Dispense Refill   albuterol (VENTOLIN HFA) 108 (90 Base) MCG/ACT inhaler Inhale into the lungs.     folic acid (FOLVITE) 1 MG tablet Take 1 tablet (1 mg total) by mouth daily. 90 tablet 0   ondansetron (ZOFRAN ODT) 4 MG disintegrating tablet Take 1 tablet (4 mg total) by mouth every 6 (six) hours as needed for nausea. 40 tablet 0   No current facility-administered medications for this visit.     PHYSICAL EXAMINATION: ECOG PERFORMANCE STATUS: 1 - Symptomatic but completely ambulatory Vitals:   05/19/21 1056  BP: 134/88  Pulse: (!) 123  Resp: 18  Temp: 97.6 F (36.4 C)  SpO2: 98%   Filed Weights   05/19/21 1056  Weight: 181 lb 12.8 oz (82.5 kg)    Physical Exam Constitutional:      General: She is not in acute distress. HENT:     Head: Normocephalic and atraumatic.  Eyes:     General: No scleral icterus. Cardiovascular:     Rate and Rhythm: Regular rhythm. Tachycardia present.     Heart sounds: Normal heart sounds.  Pulmonary:     Effort: Pulmonary effort is normal. No respiratory distress.     Breath sounds: No wheezing.  Abdominal:     Comments: Gravid uterus  Musculoskeletal:        General: No deformity. Normal range of motion.     Cervical back: Normal range of motion and neck supple.  Skin:    General: Skin is warm and dry.     Findings: No erythema or rash.  Neurological:      Mental Status: She is alert and oriented to person, place, and time. Mental status is at baseline.     Cranial Nerves: No cranial nerve deficit.     Coordination: Coordination normal.  Psychiatric:     Comments: Anxious    LABORATORY DATA:  I have reviewed the data as listed Lab Results  Component Value Date   WBC 18.6 (H) 05/19/2021   HGB 9.1 (L) 05/19/2021   HCT 28.2 (L) 05/19/2021   MCV  81.7 05/19/2021   PLT 351 05/19/2021   Recent Labs    01/21/21 1656 05/04/21 1104  NA 136 133*  K 4.2 4.1  CL 101 98  CO2 20 20  GLUCOSE 112* 75  BUN 8 11  CREATININE 0.45* 0.56*  CALCIUM 8.9 9.5  PROT 5.8* 6.3  ALBUMIN 4.2 3.7*  AST 20 12  ALT 19 8  ALKPHOS 76 121  BILITOT <0.2 <0.2   Iron/TIBC/Ferritin/ %Sat    Component Value Date/Time   IRON 38 05/04/2021 1104   TIBC 911 (HH) 05/04/2021 1104   FERRITIN 10 (L) 05/04/2021 1104   IRONPCTSAT 4 (LL) 05/04/2021 1104      RADIOGRAPHIC STUDIES: I have personally reviewed the radiological images as listed and agreed with the findings in the report. No recent related images.    ASSESSMENT & PLAN:  1. Other iron deficiency anemia   2. Anemia during pregnancy in third trimester   3. Leukocytosis, unspecified type   4. Tobacco use   5. Folate deficiency   6. B12 deficiency    #Iron deficiency anemia in the setting of third trimester pregnancy. Patient has a history of gastric sleeve resection.  She is at risk of developing vitamin deficiency.  Plan IV iron with Venofer 200mg  weekly x 4 doses. Allergy reactions/infusion reaction including anaphylactic reaction discussed with patient. Other side effects include but not limited to high blood pressure, skin rash, weight gain, leg swelling, etc. Patient voices understanding and willing to proceed.  #Vitamin B12 deficiency, recommend patient to start parenteral B12 injections daily x5 followed by weekly injection x4.  #Folate deficiency, recommend patient to start folic acid  supplementation.  Prescription sent to pharmacy.  #Leukocytosis, chronic.  Likely reactive due to smoking.  Check flow cytometry, CBC, smear #Smoke cessation was discussed with patient.  Orders Placed This Encounter  Procedures   CBC with Differential/Platelet    Standing Status:   Future    Number of Occurrences:   1    Standing Expiration Date:   05/19/2022   Technologist smear review    Standing Status:   Future    Number of Occurrences:   1    Standing Expiration Date:   05/19/2022   Flow cytometry panel-leukemia/lymphoma work-up    Standing Status:   Future    Number of Occurrences:   1    Standing Expiration Date:   05/19/2022    All questions were answered. The patient knows to call the clinic with any problems questions or concerns.  cc 05/21/2022 R, *    Return of visit: End of January 2023, lab NP +/- Venofer/B12 Thank you for this kind referral and the opportunity to participate in the care of this patient. A copy of today's note is routed to referring provider   11-04-2003, MD, PhD [facility] Hematology Oncology 05/19/2021

## 2021-05-19 NOTE — Progress Notes (Signed)
Pt here to establish care. Pt currently [redacted] weeks pregnant. She is scheduled for C section on 12/29.

## 2021-05-20 ENCOUNTER — Inpatient Hospital Stay: Payer: No Typology Code available for payment source | Attending: Nurse Practitioner

## 2021-05-20 DIAGNOSIS — D508 Other iron deficiency anemias: Secondary | ICD-10-CM | POA: Insufficient documentation

## 2021-05-20 DIAGNOSIS — D509 Iron deficiency anemia, unspecified: Secondary | ICD-10-CM

## 2021-05-20 DIAGNOSIS — E538 Deficiency of other specified B group vitamins: Secondary | ICD-10-CM | POA: Insufficient documentation

## 2021-05-20 DIAGNOSIS — O99013 Anemia complicating pregnancy, third trimester: Secondary | ICD-10-CM | POA: Diagnosis not present

## 2021-05-20 MED ORDER — CYANOCOBALAMIN 1000 MCG/ML IJ SOLN
1000.0000 ug | Freq: Once | INTRAMUSCULAR | Status: AC
  Administered 2021-05-20: 1000 ug via INTRAMUSCULAR

## 2021-05-21 ENCOUNTER — Other Ambulatory Visit: Payer: Self-pay

## 2021-05-21 ENCOUNTER — Inpatient Hospital Stay: Payer: No Typology Code available for payment source

## 2021-05-21 DIAGNOSIS — E538 Deficiency of other specified B group vitamins: Secondary | ICD-10-CM | POA: Diagnosis not present

## 2021-05-21 DIAGNOSIS — D509 Iron deficiency anemia, unspecified: Secondary | ICD-10-CM

## 2021-05-21 LAB — COMP PANEL: LEUKEMIA/LYMPHOMA

## 2021-05-21 MED ORDER — CYANOCOBALAMIN 1000 MCG/ML IJ SOLN
1000.0000 ug | Freq: Once | INTRAMUSCULAR | Status: AC
  Administered 2021-05-21: 1000 ug via INTRAMUSCULAR
  Filled 2021-05-21: qty 1

## 2021-05-24 ENCOUNTER — Inpatient Hospital Stay: Payer: No Typology Code available for payment source

## 2021-05-24 ENCOUNTER — Other Ambulatory Visit: Payer: Self-pay

## 2021-05-24 DIAGNOSIS — E538 Deficiency of other specified B group vitamins: Secondary | ICD-10-CM | POA: Diagnosis not present

## 2021-05-24 DIAGNOSIS — D509 Iron deficiency anemia, unspecified: Secondary | ICD-10-CM

## 2021-05-24 MED ORDER — CYANOCOBALAMIN 1000 MCG/ML IJ SOLN
1000.0000 ug | Freq: Once | INTRAMUSCULAR | Status: AC
  Administered 2021-05-24: 1000 ug via INTRAMUSCULAR
  Filled 2021-05-24: qty 1

## 2021-05-25 ENCOUNTER — Inpatient Hospital Stay: Payer: No Typology Code available for payment source

## 2021-05-25 ENCOUNTER — Encounter: Payer: PRIVATE HEALTH INSURANCE | Admitting: Obstetrics

## 2021-05-25 DIAGNOSIS — E538 Deficiency of other specified B group vitamins: Secondary | ICD-10-CM | POA: Diagnosis not present

## 2021-05-25 DIAGNOSIS — D509 Iron deficiency anemia, unspecified: Secondary | ICD-10-CM

## 2021-05-25 MED ORDER — CYANOCOBALAMIN 1000 MCG/ML IJ SOLN
1000.0000 ug | Freq: Once | INTRAMUSCULAR | Status: AC
  Administered 2021-05-25: 1000 ug via INTRAMUSCULAR
  Filled 2021-05-25: qty 1

## 2021-05-26 ENCOUNTER — Ambulatory Visit: Payer: No Typology Code available for payment source

## 2021-05-26 ENCOUNTER — Inpatient Hospital Stay: Payer: No Typology Code available for payment source

## 2021-05-31 ENCOUNTER — Encounter: Payer: PRIVATE HEALTH INSURANCE | Admitting: Obstetrics and Gynecology

## 2021-06-02 ENCOUNTER — Inpatient Hospital Stay: Payer: No Typology Code available for payment source

## 2021-06-02 ENCOUNTER — Other Ambulatory Visit: Payer: Self-pay

## 2021-06-02 VITALS — BP 139/87 | HR 95 | Temp 97.6°F | Resp 18

## 2021-06-02 DIAGNOSIS — D509 Iron deficiency anemia, unspecified: Secondary | ICD-10-CM

## 2021-06-02 DIAGNOSIS — E538 Deficiency of other specified B group vitamins: Secondary | ICD-10-CM | POA: Diagnosis not present

## 2021-06-02 MED ORDER — CYANOCOBALAMIN 1000 MCG/ML IJ SOLN
1000.0000 ug | Freq: Once | INTRAMUSCULAR | Status: AC
  Administered 2021-06-02: 14:00:00 1000 ug via INTRAMUSCULAR
  Filled 2021-06-02: qty 1

## 2021-06-02 MED ORDER — SODIUM CHLORIDE 0.9 % IV SOLN: 200.0000 mg | Freq: Once | INTRAVENOUS | Status: DC

## 2021-06-02 MED ORDER — IRON SUCROSE 20 MG/ML IV SOLN
200.0000 mg | Freq: Once | INTRAVENOUS | Status: AC
  Administered 2021-06-02: 14:00:00 200 mg via INTRAVENOUS
  Filled 2021-06-02: qty 10

## 2021-06-02 MED ORDER — SODIUM CHLORIDE 0.9 % IV SOLN
Freq: Once | INTRAVENOUS | Status: AC
  Filled 2021-06-02: qty 250

## 2021-06-02 NOTE — Patient Instructions (Signed)
Iron Sucrose Injection What is this medication? IRON SUCROSE (EYE ern SOO krose) treats low levels of iron (iron deficiency anemia) in people with kidney disease. Iron is a mineral that plays an important role in making red blood cells, which carry oxygen from your lungs to the rest of your body. This medicine may be used for other purposes; ask your health care provider or pharmacist if you have questions. COMMON BRAND NAME(S): Venofer What should I tell my care team before I take this medication? They need to know if you have any of these conditions: Anemia not caused by low iron levels Heart disease High levels of iron in the blood Kidney disease Liver disease An unusual or allergic reaction to iron, other medications, foods, dyes, or preservatives Pregnant or trying to get pregnant Breast-feeding How should I use this medication? This medication is for infusion into a vein. It is given in a hospital or clinic setting. Talk to your care team about the use of this medication in children. While this medication may be prescribed for children as young as 2 years for selected conditions, precautions do apply. Overdosage: If you think you have taken too much of this medicine contact a poison control center or emergency room at once. NOTE: This medicine is only for you. Do not share this medicine with others. What if I miss a dose? It is important not to miss your dose. Call your care team if you are unable to keep an appointment. What may interact with this medication? Do not take this medication with any of the following: Deferoxamine Dimercaprol Other iron products This medication may also interact with the following: Chloramphenicol Deferasirox This list may not describe all possible interactions. Give your health care provider a list of all the medicines, herbs, non-prescription drugs, or dietary supplements you use. Also tell them if you smoke, drink alcohol, or use illegal drugs.  Some items may interact with your medicine. What should I watch for while using this medication? Visit your care team regularly. Tell your care team if your symptoms do not start to get better or if they get worse. You may need blood work done while you are taking this medication. You may need to follow a special diet. Talk to your care team. Foods that contain iron include: whole grains/cereals, dried fruits, beans, or peas, leafy green vegetables, and organ meats (liver, kidney). What side effects may I notice from receiving this medication? Side effects that you should report to your care team as soon as possible: Allergic reactions--skin rash, itching, hives, swelling of the face, lips, tongue, or throat Low blood pressure--dizziness, feeling faint or lightheaded, blurry vision Shortness of breath Side effects that usually do not require medical attention (report to your care team if they continue or are bothersome): Flushing Headache Joint pain Muscle pain Nausea Pain, redness, or irritation at injection site This list may not describe all possible side effects. Call your doctor for medical advice about side effects. You may report side effects to FDA at 1-800-FDA-1088. Where should I keep my medication? This medication is given in a hospital or clinic and will not be stored at home. NOTE: This sheet is a summary. It may not cover all possible information. If you have questions about this medicine, talk to your doctor, pharmacist, or health care provider. Vitamin B12 Injection What is this medication? Vitamin B12 (VAHY tuh min B12) prevents and treats low vitamin B12 levels in your body. It is used in people who  do not get enough vitamin B12 from their diet or when their digestive tract does not absorb enough. Vitamin B12 plays an important role in maintaining the health of your nervous system and red blood cells. This medicine may be used for other purposes; ask your health care provider  or pharmacist if you have questions. COMMON BRAND NAME(S): B-12 Compliance Kit, B-12 Injection Kit, Cyomin, Dodex, LA-12, Nutri-Twelve, Physicians EZ Use B-12, Primabalt What should I tell my care team before I take this medication? They need to know if you have any of these conditions: Kidney disease Leber's disease Megaloblastic anemia An unusual or allergic reaction to cyanocobalamin, cobalt, other medications, foods, dyes, or preservatives Pregnant or trying to get pregnant Breast-feeding How should I use this medication? This medication is injected into a muscle or deeply under the skin. It is usually given in a clinic or care team's office. However, your care team may teach you how to inject yourself. Follow all instructions. Talk to your care team about the use of this medication in children. Special care may be needed. Overdosage: If you think you have taken too much of this medicine contact a poison control center or emergency room at once. NOTE: This medicine is only for you. Do not share this medicine with others. What if I miss a dose? If you are given your dose at a clinic or care team's office, call to reschedule your appointment. If you give your own injections, and you miss a dose, take it as soon as you can. If it is almost time for your next dose, take only that dose. Do not take double or extra doses. What may interact with this medication? Colchicine Heavy alcohol intake This list may not describe all possible interactions. Give your health care provider a list of all the medicines, herbs, non-prescription drugs, or dietary supplements you use. Also tell them if you smoke, drink alcohol, or use illegal drugs. Some items may interact with your medicine. What should I watch for while using this medication? Visit your care team regularly. You may need blood work done while you are taking this medication. You may need to follow a special diet. Talk to your care team. Limit your  alcohol intake and avoid smoking to get the best benefit. What side effects may I notice from receiving this medication? Side effects that you should report to your care team as soon as possible: Allergic reactions--skin rash, itching, hives, swelling of the face, lips, tongue, or throat Swelling of the ankles, hands, or feet Trouble breathing Side effects that usually do not require medical attention (report to your care team if they continue or are bothersome): Diarrhea This list may not describe all possible side effects. Call your doctor for medical advice about side effects. You may report side effects to FDA at 1-800-FDA-1088. Where should I keep my medication? Keep out of the reach of children. Store at room temperature between 15 and 30 degrees C (59 and 85 degrees F). Protect from light. Throw away any unused medication after the expiration date. NOTE: This sheet is a summary. It may not cover all possible information. If you have questions about this medicine, talk to your doctor, pharmacist, or health care provider.  2022 Elsevier/Gold Standard (2020-08-19 00:00:00)

## 2021-06-02 NOTE — Progress Notes (Signed)
Patient tolerated initial Venofer infusion well today, no concerns voiced. Patient monitored post-transfusion. Vitamin B12 injection also given.  Patient discharged. Stable.

## 2021-06-08 ENCOUNTER — Encounter: Payer: PRIVATE HEALTH INSURANCE | Admitting: Obstetrics and Gynecology

## 2021-06-09 ENCOUNTER — Encounter
Admission: EM | Disposition: A | Discharge status: Home / Self Care | Payer: Self-pay | Source: Home / Self Care | Attending: Obstetrics and Gynecology

## 2021-06-09 ENCOUNTER — Inpatient Hospital Stay
Admission: EM | Admit: 2021-06-09 | Discharge: 2021-06-12 | DRG: 785 | Disposition: A | Discharge status: Home / Self Care | Payer: No Typology Code available for payment source | Attending: Obstetrics and Gynecology | Admitting: Obstetrics and Gynecology

## 2021-06-09 ENCOUNTER — Inpatient Hospital Stay: Payer: No Typology Code available for payment source | Admitting: Anesthesiology

## 2021-06-09 ENCOUNTER — Inpatient Hospital Stay: Payer: No Typology Code available for payment source

## 2021-06-09 ENCOUNTER — Encounter: Payer: Self-pay | Admitting: Obstetrics & Gynecology

## 2021-06-09 ENCOUNTER — Ambulatory Visit (INDEPENDENT_AMBULATORY_CARE_PROVIDER_SITE_OTHER): Payer: No Typology Code available for payment source | Admitting: Obstetrics and Gynecology

## 2021-06-09 ENCOUNTER — Other Ambulatory Visit: Payer: Self-pay

## 2021-06-09 ENCOUNTER — Encounter: Payer: Self-pay | Admitting: Oncology

## 2021-06-09 VITALS — BP 151/98 | Wt 182.0 lb

## 2021-06-09 DIAGNOSIS — Z20822 Contact with and (suspected) exposure to covid-19: Secondary | ICD-10-CM | POA: Diagnosis present

## 2021-06-09 DIAGNOSIS — O09293 Supervision of pregnancy with other poor reproductive or obstetric history, third trimester: Secondary | ICD-10-CM

## 2021-06-09 DIAGNOSIS — Z3A37 37 weeks gestation of pregnancy: Secondary | ICD-10-CM

## 2021-06-09 DIAGNOSIS — O163 Unspecified maternal hypertension, third trimester: Secondary | ICD-10-CM | POA: Diagnosis not present

## 2021-06-09 DIAGNOSIS — Z302 Encounter for sterilization: Secondary | ICD-10-CM

## 2021-06-09 DIAGNOSIS — O1404 Mild to moderate pre-eclampsia, complicating childbirth: Principal | ICD-10-CM | POA: Diagnosis present

## 2021-06-09 DIAGNOSIS — D509 Iron deficiency anemia, unspecified: Secondary | ICD-10-CM | POA: Diagnosis present

## 2021-06-09 DIAGNOSIS — O34219 Maternal care for unspecified type scar from previous cesarean delivery: Secondary | ICD-10-CM | POA: Diagnosis present

## 2021-06-09 DIAGNOSIS — R030 Elevated blood-pressure reading, without diagnosis of hypertension: Secondary | ICD-10-CM | POA: Diagnosis present

## 2021-06-09 DIAGNOSIS — O99844 Bariatric surgery status complicating childbirth: Secondary | ICD-10-CM | POA: Diagnosis present

## 2021-06-09 DIAGNOSIS — O99210 Obesity complicating pregnancy, unspecified trimester: Secondary | ICD-10-CM

## 2021-06-09 DIAGNOSIS — O9903 Anemia complicating the puerperium: Secondary | ICD-10-CM | POA: Diagnosis not present

## 2021-06-09 DIAGNOSIS — D62 Acute posthemorrhagic anemia: Secondary | ICD-10-CM | POA: Diagnosis not present

## 2021-06-09 DIAGNOSIS — J449 Chronic obstructive pulmonary disease, unspecified: Secondary | ICD-10-CM | POA: Diagnosis present

## 2021-06-09 DIAGNOSIS — Z880 Allergy status to penicillin: Secondary | ICD-10-CM | POA: Diagnosis not present

## 2021-06-09 DIAGNOSIS — Z3685 Encounter for antenatal screening for Streptococcus B: Secondary | ICD-10-CM

## 2021-06-09 DIAGNOSIS — O9952 Diseases of the respiratory system complicating childbirth: Secondary | ICD-10-CM | POA: Diagnosis present

## 2021-06-09 DIAGNOSIS — O9902 Anemia complicating childbirth: Secondary | ICD-10-CM | POA: Diagnosis present

## 2021-06-09 DIAGNOSIS — O0993 Supervision of high risk pregnancy, unspecified, third trimester: Secondary | ICD-10-CM

## 2021-06-09 DIAGNOSIS — O99320 Drug use complicating pregnancy, unspecified trimester: Secondary | ICD-10-CM | POA: Insufficient documentation

## 2021-06-09 DIAGNOSIS — Z98891 History of uterine scar from previous surgery: Secondary | ICD-10-CM

## 2021-06-09 DIAGNOSIS — O34211 Maternal care for low transverse scar from previous cesarean delivery: Secondary | ICD-10-CM | POA: Diagnosis not present

## 2021-06-09 LAB — URINALYSIS, ROUTINE W REFLEX MICROSCOPIC
Glucose, UA: NEGATIVE mg/dL
Hgb urine dipstick: NEGATIVE
Nitrite: NEGATIVE
Protein, ur: 100 mg/dL — AB
Specific Gravity, Urine: 1.025 (ref 1.005–1.030)
Squamous Epithelial / HPF: >50 — ABNORMAL HIGH (ref 0–5)
pH: 5.5 (ref 5.0–8.0)

## 2021-06-09 LAB — TYPE AND SCREEN
ABO/RH(D): O NEG
Antibody Screen: POSITIVE

## 2021-06-09 LAB — CBC
HCT: 28.1 % — ABNORMAL LOW (ref 36.0–46.0)
Hemoglobin: 9 g/dL — ABNORMAL LOW (ref 12.0–15.0)
MCH: 25.4 pg — ABNORMAL LOW (ref 26.0–34.0)
MCHC: 32 g/dL (ref 30.0–36.0)
MCV: 79.4 fL — ABNORMAL LOW (ref 80.0–100.0)
Platelets: 258 10*3/uL (ref 150–400)
RBC: 3.54 MIL/uL — ABNORMAL LOW (ref 3.87–5.11)
RDW: 20.1 % — ABNORMAL HIGH (ref 11.5–15.5)
WBC: 11.6 10*3/uL — ABNORMAL HIGH (ref 4.0–10.5)
nRBC: 1.2 % — ABNORMAL HIGH (ref 0.0–0.2)

## 2021-06-09 LAB — COMPREHENSIVE METABOLIC PANEL
ALT: 17 U/L (ref 0–44)
AST: 31 U/L (ref 15–41)
Albumin: 2.5 g/dL — ABNORMAL LOW (ref 3.5–5.0)
Alkaline Phosphatase: 239 U/L — ABNORMAL HIGH (ref 38–126)
Anion gap: 8 (ref 5–15)
BUN: 8 mg/dL (ref 6–20)
CO2: 22 mmol/L (ref 22–32)
Calcium: 8.8 mg/dL — ABNORMAL LOW (ref 8.9–10.3)
Chloride: 108 mmol/L (ref 98–111)
Creatinine, Ser: 0.55 mg/dL (ref 0.44–1.00)
GFR, Estimated: >60 mL/min (ref >60)
Glucose, Bld: 88 mg/dL (ref 70–99)
Potassium: 3.2 mmol/L — ABNORMAL LOW (ref 3.5–5.1)
Sodium: 138 mmol/L (ref 135–145)
Total Bilirubin: 0.7 mg/dL (ref 0.3–1.2)
Total Protein: 6 g/dL — ABNORMAL LOW (ref 6.5–8.1)

## 2021-06-09 LAB — PROTEIN / CREATININE RATIO, URINE
Creatinine, Urine: 190 mg/dL
Protein Creatinine Ratio: 0.38 mg/mg{Cre} — ABNORMAL HIGH (ref 0.00–0.15)
Total Protein, Urine: 73 mg/dL

## 2021-06-09 LAB — RAPID HIV SCREEN (HIV 1/2 AB+AG)
HIV 1/2 Antibodies: NONREACTIVE
HIV-1 P24 Antigen - HIV24: NONREACTIVE

## 2021-06-09 LAB — URINE DRUG SCREEN, QUALITATIVE (ARMC ONLY)
Amphetamines, Ur Screen: POSITIVE — AB
Barbiturates, Ur Screen: NOT DETECTED
Benzodiazepine, Ur Scrn: NOT DETECTED
Cannabinoid 50 Ng, Ur ~~LOC~~: NOT DETECTED
Cocaine Metabolite,Ur ~~LOC~~: NOT DETECTED
MDMA (Ecstasy)Ur Screen: NOT DETECTED
Methadone Scn, Ur: NOT DETECTED
Opiate, Ur Screen: POSITIVE — AB
Phencyclidine (PCP) Ur S: NOT DETECTED
Tricyclic, Ur Screen: NOT DETECTED

## 2021-06-09 LAB — RESP PANEL BY RT-PCR (FLU A&B, COVID) ARPGX2
Influenza A by PCR: NEGATIVE
Influenza B by PCR: NEGATIVE
SARS Coronavirus 2 by RT PCR: NEGATIVE

## 2021-06-09 SURGERY — Surgical Case
Anesthesia: Spinal | Laterality: Bilateral

## 2021-06-09 MED ORDER — TETANUS-DIPHTH-ACELL PERTUSSIS 5-2.5-18.5 LF-MCG/0.5 IM SUSY
0.5000 mL | PREFILLED_SYRINGE | Freq: Once | INTRAMUSCULAR | Status: DC
  Filled 2021-06-09: qty 0.5

## 2021-06-09 MED ORDER — ONDANSETRON HCL 4 MG/2ML IJ SOLN
INTRAMUSCULAR | Status: DC | PRN
  Administered 2021-06-09: 4 mg via INTRAVENOUS

## 2021-06-09 MED ORDER — SENNOSIDES-DOCUSATE SODIUM 8.6-50 MG PO TABS
2.0000 | ORAL_TABLET | ORAL | Status: DC
  Administered 2021-06-10 – 2021-06-11 (×2): 2 via ORAL
  Filled 2021-06-09 (×2): qty 2

## 2021-06-09 MED ORDER — DIPHENHYDRAMINE HCL 25 MG PO CAPS: 25.0000 mg | ORAL_CAPSULE | ORAL | Status: DC | PRN

## 2021-06-09 MED ORDER — ONDANSETRON HCL 4 MG/2ML IJ SOLN: 4.0000 mg | Freq: Three times a day (TID) | INTRAMUSCULAR | Status: DC | PRN

## 2021-06-09 MED ORDER — ACETAMINOPHEN 500 MG PO TABS: 1000.0000 mg | ORAL_TABLET | Freq: Four times a day (QID) | ORAL | Status: DC

## 2021-06-09 MED ORDER — SOD CITRATE-CITRIC ACID 500-334 MG/5ML PO SOLN
ORAL | Status: AC
  Filled 2021-06-09: qty 15

## 2021-06-09 MED ORDER — ZOLPIDEM TARTRATE 5 MG PO TABS: 5.0000 mg | ORAL_TABLET | Freq: Every evening | ORAL | Status: DC | PRN

## 2021-06-09 MED ORDER — CLINDAMYCIN PHOSPHATE 900 MG/50ML IV SOLN
900.0000 mg | INTRAVENOUS | Status: AC
  Administered 2021-06-09: 19:00:00 900 mg via INTRAVENOUS
  Filled 2021-06-09: qty 50

## 2021-06-09 MED ORDER — MENTHOL 3 MG MT LOZG
1.0000 | LOZENGE | OROMUCOSAL | Status: DC | PRN
  Filled 2021-06-09: qty 9

## 2021-06-09 MED ORDER — SIMETHICONE 80 MG PO CHEW
80.0000 mg | CHEWABLE_TABLET | Freq: Three times a day (TID) | ORAL | Status: DC
  Administered 2021-06-10 – 2021-06-12 (×7): 80 mg via ORAL
  Filled 2021-06-09 (×7): qty 1

## 2021-06-09 MED ORDER — DEXAMETHASONE SODIUM PHOSPHATE 10 MG/ML IJ SOLN
INTRAMUSCULAR | Status: DC | PRN
  Administered 2021-06-09: 8 mg via INTRAVENOUS

## 2021-06-09 MED ORDER — MORPHINE SULFATE (PF) 0.5 MG/ML IJ SOLN
INTRAMUSCULAR | Status: AC
  Filled 2021-06-09: qty 10

## 2021-06-09 MED ORDER — HYDROXYZINE HCL 25 MG PO TABS
25.0000 mg | ORAL_TABLET | Freq: Four times a day (QID) | ORAL | Status: DC | PRN
  Filled 2021-06-09: qty 1

## 2021-06-09 MED ORDER — DIPHENHYDRAMINE HCL 50 MG/ML IJ SOLN: 12.5000 mg | INTRAMUSCULAR | Status: DC | PRN

## 2021-06-09 MED ORDER — SODIUM CHLORIDE 0.9% FLUSH: 3.0000 mL | INTRAVENOUS | Status: DC | PRN

## 2021-06-09 MED ORDER — BUPIVACAINE 0.25 % ON-Q PUMP DUAL CATH 400 ML
400.0000 mL | INJECTION | Status: DC
  Filled 2021-06-09: qty 400

## 2021-06-09 MED ORDER — MEPERIDINE HCL 25 MG/ML IJ SOLN: 6.2500 mg | INTRAMUSCULAR | Status: DC | PRN

## 2021-06-09 MED ORDER — NALOXONE HCL 4 MG/10ML IJ SOLN
1.0000 ug/kg/h | INTRAVENOUS | Status: DC | PRN
  Filled 2021-06-09: qty 5

## 2021-06-09 MED ORDER — COCONUT OIL OIL
1.0000 "application " | TOPICAL_OIL | Status: DC | PRN
  Filled 2021-06-09: qty 120

## 2021-06-09 MED ORDER — MORPHINE SULFATE (PF) 2 MG/ML IV SOLN
1.0000 mg | INTRAVENOUS | Status: DC | PRN
  Administered 2021-06-11: 02:00:00 1 mg via INTRAVENOUS
  Filled 2021-06-09: qty 1

## 2021-06-09 MED ORDER — BUPIVACAINE IN DEXTROSE 0.75-8.25 % IT SOLN
INTRATHECAL | Status: DC | PRN
  Administered 2021-06-09: 1.6 mL via INTRATHECAL

## 2021-06-09 MED ORDER — MORPHINE SULFATE (PF) 0.5 MG/ML IJ SOLN
INTRAMUSCULAR | Status: DC | PRN
  Administered 2021-06-09: 100 ug via INTRATHECAL

## 2021-06-09 MED ORDER — OXYTOCIN-SODIUM CHLORIDE 30-0.9 UT/500ML-% IV SOLN
INTRAVENOUS | Status: DC | PRN
  Administered 2021-06-09: 400 mL via INTRAVENOUS
  Administered 2021-06-09: 250 mL/h via INTRAVENOUS

## 2021-06-09 MED ORDER — LACTATED RINGERS IV SOLN: INTRAVENOUS | Status: DC | PRN

## 2021-06-09 MED ORDER — BUPIVACAINE HCL (PF) 0.5 % IJ SOLN
INTRAMUSCULAR | Status: AC
  Filled 2021-06-09: qty 10

## 2021-06-09 MED ORDER — MAGNESIUM SULFATE BOLUS VIA INFUSION
4.0000 g | Freq: Once | INTRAVENOUS | Status: AC
  Administered 2021-06-09: 21:00:00 4 g via INTRAVENOUS
  Filled 2021-06-09: qty 1000

## 2021-06-09 MED ORDER — BUPIVACAINE HCL (PF) 0.5 % IJ SOLN
INTRAMUSCULAR | Status: DC | PRN
  Administered 2021-06-09: 10 mL

## 2021-06-09 MED ORDER — ACETAMINOPHEN 500 MG PO TABS
1000.0000 mg | ORAL_TABLET | Freq: Four times a day (QID) | ORAL | Status: AC
  Administered 2021-06-09 – 2021-06-10 (×4): 1000 mg via ORAL
  Filled 2021-06-09 (×4): qty 2

## 2021-06-09 MED ORDER — OXYCODONE HCL 5 MG PO TABS
5.0000 mg | ORAL_TABLET | ORAL | Status: DC | PRN
  Administered 2021-06-10 (×2): 5 mg via ORAL
  Administered 2021-06-11 (×2): 10 mg via ORAL
  Administered 2021-06-11 – 2021-06-12 (×4): 5 mg via ORAL
  Filled 2021-06-09 (×2): qty 1
  Filled 2021-06-09: qty 2
  Filled 2021-06-09 (×3): qty 1
  Filled 2021-06-09: qty 2
  Filled 2021-06-09 (×2): qty 1

## 2021-06-09 MED ORDER — BUPIVACAINE ON-Q PAIN PUMP (FOR ORDER SET NO CHG)
INJECTION | Status: DC
  Filled 2021-06-09: qty 1

## 2021-06-09 MED ORDER — DIPHENHYDRAMINE HCL 25 MG PO CAPS: 25.0000 mg | ORAL_CAPSULE | Freq: Four times a day (QID) | ORAL | Status: DC | PRN

## 2021-06-09 MED ORDER — PHENYLEPHRINE HCL-NACL 20-0.9 MG/250ML-% IV SOLN
INTRAVENOUS | Status: DC | PRN
  Administered 2021-06-09: 50 ug/min via INTRAVENOUS

## 2021-06-09 MED ORDER — NALOXONE HCL 0.4 MG/ML IJ SOLN: 0.4000 mg | INTRAMUSCULAR | Status: DC | PRN

## 2021-06-09 MED ORDER — LABETALOL HCL 5 MG/ML IV SOLN
20.0000 mg | INTRAVENOUS | Status: DC | PRN
  Administered 2021-06-09 – 2021-06-10 (×2): 20 mg via INTRAVENOUS
  Filled 2021-06-09 (×2): qty 4

## 2021-06-09 MED ORDER — PRENATAL MULTIVITAMIN CH
1.0000 | ORAL_TABLET | Freq: Every day | ORAL | Status: DC
  Administered 2021-06-10 – 2021-06-12 (×3): 1 via ORAL
  Filled 2021-06-09 (×3): qty 1

## 2021-06-09 MED ORDER — LACTATED RINGERS IV SOLN: INTRAVENOUS | Status: DC

## 2021-06-09 MED ORDER — GENTAMICIN SULFATE 40 MG/ML IJ SOLN
5.0000 mg/kg | INTRAVENOUS | Status: AC
  Administered 2021-06-09: 19:00:00 410 mg via INTRAVENOUS
  Filled 2021-06-09: qty 10.25

## 2021-06-09 MED ORDER — PHENYLEPHRINE HCL (PRESSORS) 10 MG/ML IV SOLN
INTRAVENOUS | Status: DC | PRN
Start: 2021-06-09 — End: 2021-06-09
  Administered 2021-06-09 (×3): 100 ug via INTRAVENOUS

## 2021-06-09 MED ORDER — OXYTOCIN-SODIUM CHLORIDE 30-0.9 UT/500ML-% IV SOLN
INTRAVENOUS | Status: AC
  Filled 2021-06-09: qty 1000

## 2021-06-09 MED ORDER — BUPIVACAINE HCL 0.5 % IJ SOLN
10.0000 mL | Freq: Once | INTRAMUSCULAR | Status: DC
  Filled 2021-06-09: qty 10

## 2021-06-09 MED ORDER — FENTANYL CITRATE (PF) 100 MCG/2ML IJ SOLN
INTRAMUSCULAR | Status: DC | PRN
  Administered 2021-06-09: 85 ug via INTRATHECAL
  Administered 2021-06-09: 15 ug via INTRATHECAL

## 2021-06-09 MED ORDER — MAGNESIUM SULFATE 40 GM/1000ML IV SOLN
2.0000 g/h | INTRAVENOUS | Status: DC
  Administered 2021-06-10: 17:00:00 2 g/h via INTRAVENOUS
  Filled 2021-06-09 (×2): qty 1000

## 2021-06-09 MED ORDER — WITCH HAZEL-GLYCERIN EX PADS: 1.0000 "application " | MEDICATED_PAD | CUTANEOUS | Status: DC | PRN

## 2021-06-09 MED ORDER — DIBUCAINE (PERIANAL) 1 % EX OINT: 1.0000 "application " | TOPICAL_OINTMENT | CUTANEOUS | Status: DC | PRN

## 2021-06-09 MED ORDER — LABETALOL HCL 5 MG/ML IV SOLN: 40.0000 mg | INTRAVENOUS | Status: DC | PRN

## 2021-06-09 MED ORDER — HYDRALAZINE HCL 20 MG/ML IJ SOLN: 10.0000 mg | INTRAMUSCULAR | Status: DC | PRN

## 2021-06-09 MED ORDER — SCOPOLAMINE 1 MG/3DAYS TD PT72
1.0000 | MEDICATED_PATCH | Freq: Once | TRANSDERMAL | Status: DC
  Administered 2021-06-09: 1.5 mg via TRANSDERMAL
  Filled 2021-06-09: qty 1

## 2021-06-09 MED ORDER — FENTANYL CITRATE (PF) 100 MCG/2ML IJ SOLN
INTRAMUSCULAR | Status: AC
  Filled 2021-06-09: qty 2

## 2021-06-09 MED ORDER — CALCIUM GLUCONATE 10 % IV SOLN
INTRAVENOUS | Status: AC
  Filled 2021-06-09: qty 10

## 2021-06-09 MED ORDER — SOD CITRATE-CITRIC ACID 500-334 MG/5ML PO SOLN
30.0000 mL | ORAL | Status: AC
  Administered 2021-06-09: 18:00:00 30 mL via ORAL

## 2021-06-09 MED ORDER — OXYTOCIN-SODIUM CHLORIDE 30-0.9 UT/500ML-% IV SOLN
2.5000 [IU]/h | INTRAVENOUS | Status: AC
  Administered 2021-06-09: 20:00:00 2.5 [IU]/h via INTRAVENOUS
  Filled 2021-06-09: qty 500

## 2021-06-09 MED ORDER — LABETALOL HCL 5 MG/ML IV SOLN: 80.0000 mg | INTRAVENOUS | Status: DC | PRN

## 2021-06-09 MED ORDER — SIMETHICONE 80 MG PO CHEW: 80.0000 mg | CHEWABLE_TABLET | ORAL | Status: DC | PRN

## 2021-06-09 SURGICAL SUPPLY — 34 items
BAG COUNTER SPONGE SURGICOUNT (BAG) ×2 IMPLANT
BAG SURGICOUNT SPONGE COUNTING (BAG) ×1
CATH KIT ON-Q SILVERSOAK 5 (CATHETERS) ×2 IMPLANT
CATH KIT ON-Q SILVERSOAK 5IN (CATHETERS) ×6 IMPLANT
CHLORAPREP W/TINT 26 (MISCELLANEOUS) ×6 IMPLANT
CLOSURE WOUND 1/2 X4 (GAUZE/BANDAGES/DRESSINGS) ×1
DERMABOND ADVANCED (GAUZE/BANDAGES/DRESSINGS) ×2
DERMABOND ADVANCED .7 DNX12 (GAUZE/BANDAGES/DRESSINGS) ×1 IMPLANT
DRSG OPSITE POSTOP 4X10 (GAUZE/BANDAGES/DRESSINGS) ×3 IMPLANT
DRSG TELFA 3X8 NADH (GAUZE/BANDAGES/DRESSINGS) ×3 IMPLANT
ELECT CAUTERY BLADE 6.4 (BLADE) ×3 IMPLANT
ELECT REM PT RETURN 9FT ADLT (ELECTROSURGICAL) ×3
ELECTRODE REM PT RTRN 9FT ADLT (ELECTROSURGICAL) ×1 IMPLANT
GAUZE SPONGE 4X4 12PLY STRL (GAUZE/BANDAGES/DRESSINGS) ×3 IMPLANT
GLOVE SURG ENC MOIS LTX SZ7 (GLOVE) ×3 IMPLANT
GLOVE SURG UNDER LTX SZ7.5 (GLOVE) ×3 IMPLANT
GOWN STRL REUS W/ TWL LRG LVL3 (GOWN DISPOSABLE) ×3 IMPLANT
GOWN STRL REUS W/TWL LRG LVL3 (GOWN DISPOSABLE) ×9
MANIFOLD NEPTUNE II (INSTRUMENTS) ×3 IMPLANT
MAT PREVALON FULL STRYKER (MISCELLANEOUS) ×3 IMPLANT
NS IRRIG 1000ML POUR BTL (IV SOLUTION) ×3 IMPLANT
PACK C SECTION AR (MISCELLANEOUS) ×3 IMPLANT
PAD DRESSING TELFA 3X8 NADH (GAUZE/BANDAGES/DRESSINGS) ×1 IMPLANT
PAD OB MATERNITY 4.3X12.25 (PERSONAL CARE ITEMS) ×3 IMPLANT
PAD PREP 24X41 OB/GYN DISP (PERSONAL CARE ITEMS) ×3 IMPLANT
PENCIL SMOKE EVACUATOR (MISCELLANEOUS) ×3 IMPLANT
SCRUB EXIDINE 4% CHG 4OZ (MISCELLANEOUS) ×3 IMPLANT
STRIP CLOSURE SKIN 1/2X4 (GAUZE/BANDAGES/DRESSINGS) ×2 IMPLANT
SUT MNCRL AB 4-0 PS2 18 (SUTURE) ×3 IMPLANT
SUT PDS AB 1 TP1 96 (SUTURE) ×6 IMPLANT
SUT VIC AB 0 CTX 36 (SUTURE) ×6
SUT VIC AB 0 CTX36XBRD ANBCTRL (SUTURE) ×2 IMPLANT
SUT VIC AB 2-0 CT1 36 (SUTURE) ×3 IMPLANT
WATER STERILE IRR 500ML POUR (IV SOLUTION) ×3 IMPLANT

## 2021-06-09 NOTE — Transfer of Care (Signed)
Immediate Anesthesia Transfer of Care Note  Patient: Michelle Kelley  Procedure(s) Performed: CESAREAN SECTION WITH BILATERAL TUBAL LIGATION (Bilateral)  Patient Location: PACU and Mother/Baby  Anesthesia Type:Spinal  Level of Consciousness: awake  Airway & Oxygen Therapy: Patient Spontanous Breathing  Post-op Assessment: Report given to RN  Post vital signs: stable  Last Vitals:  Vitals Value Taken Time  BP    Temp    Pulse    Resp    SpO2      Last Pain:  Vitals:   06/09/21 1411  TempSrc: Oral         Complications: No notable events documented.

## 2021-06-09 NOTE — Anesthesia Procedure Notes (Signed)
Spinal  Patient location during procedure: OR Start time: 06/09/2021 6:45 PM End time: 06/09/2021 6:50 PM Reason for block: surgical anesthesia Staffing Performed: resident/CRNA  Anesthesiologist: Darrin Nipper, MD Resident/CRNA: Johnna Acosta, CRNA Preanesthetic Checklist Completed: patient identified, IV checked, site marked, risks and benefits discussed, surgical consent, monitors and equipment checked, pre-op evaluation and timeout performed Spinal Block Patient position: sitting Prep: ChloraPrep Patient monitoring: heart rate, continuous pulse ox, blood pressure and cardiac monitor Approach: midline Location: L3-4 Injection technique: single-shot Needle Needle type: Whitacre and Introducer  Needle gauge: 24 G Needle length: 9 cm Assessment Sensory level: T10 Events: CSF return Additional Notes Negative paresthesia. Negative blood return. Positive free-flowing CSF. Expiration date of kit checked and confirmed. Patient tolerated procedure well, without complications.

## 2021-06-09 NOTE — Op Note (Addendum)
Cesarean Section Procedure Note Indications:  Preeclampsia, prior cesarean section, and term intrauterine pregnancy, Desire for permanent sterility  Pre-operative Diagnosis: Intrauterine pregnancy [redacted]w[redacted]d ;   Preeclampsia, prior cesarean section, and term intrauterine pregnancy, Desire for permanent sterility Post-operative Diagnosis: same, delivered. Procedure: Low Transverse Cesarean Section, Bilateral Tubal Ligation Surgeon: Annamarie Major, MD Assistant(s): Liana Crocker, CNM, No other capable assistant available, in surgery requiring high level assistant. Anesthesia: Spinal anesthesia Estimated Blood Loss:400 Complications: None; patient tolerated the procedure well. Disposition: PACU - hemodynamically stable. Condition: stable  Findings: A female infant in the cephalic presentation. "Shadow" Amniotic fluid - Clear  Birth weight 2540 g.  Apgars of 9 and 9.  Intact placenta with a three-vessel cord. Grossly normal uterus, tubes and ovaries bilaterally. Some intraabdominal adhesions were noted.  Procedure Details   The patient was taken to Operating Room, identified as the correct patient and the procedure verified as C-Section Delivery. A Time Out was held and the above information confirmed. After induction of anesthesia, the patient was draped and prepped in the usual sterile manner. A Pfannenstiel incision was made and carried down through the subcutaneous tissue to the fascia. Fascial incision was made and extended transversely with the Mayo scissors. The fascia was separated from the underlying rectus tissue superiorly and inferiorly. The peritoneum was identified and entered bluntly. Peritoneal incision was extended longitudinally. The utero-vesical peritoneal reflection was incised transversely and a bladder flap was created digitally.  A low transverse hysterotomy was made. The fetus was delivered atraumatically. The umbilical cord was clamped x2 and cut and the infant was handed to the  awaiting pediatricians. The placenta was removed intact and appeared normal with a 3-vessel cord.  The uterus was exteriorized and cleared of all clot and debris. The hysterotomy was closed with running sutures of 0 Vicryl suture. A second imbricating layer was placed with the same suture. Excellent hemostasis was observed.   The left Fallopian tube was identified, grasped with the Babcock clamps, lifted to the skin incision and followed out distally to the fimbriae. An avascular midsection of the tube approximately 3-4cm from the cornua was grasped with the babcock clamps and brought into a knuckle at the skin incision. The tube was double ligated with 2-0 Vicryl suture and the intervening portion of tube was transected and removed. Excellent hemostasis was noted and the tube was returned to the abdomen. Attention was then turned to the right fallopian tube after confirmation of identification by tracing the tube out to the fimbriae. The same procedure was then performed on the right Fallopian tube. Again, excellent hemostasis was noted at the end of the procedure.  The uterus was returned to the abdomen. The pelvis was irrigated and again, excellent hemostasis was noted.  The On Q Pain pump System was then placed.  Trocars were placed through the abdominal wall into the subfascial space and these were used to thread the silver soaker cathaters into place.The rectus fascia was then reapproximated with running sutures of Maxon, with careful placement not to incorporate the cathaters. Subcutaneous tissues are then irrigated with saline and hemostasis assured.  Skin is then closed with 4-0 vicryl suture in a subcuticular fashion followed by skin adhesive. The cathaters are flushed each with 5 mL of Bupivicaine and stabilized into place with dressing. The surgical assistant performed tissue retraction, assistance with suturing, and fundal pressure.   Instrument, sponge, and needle counts were correct prior to  the abdominal closure and at the conclusion of the case.  The  patient tolerated the procedure well and was transferred to the recovery room in stable condition.   Annamarie Major, MD, [facility], [facility] 06/09/2021  7:41 PM

## 2021-06-09 NOTE — H&P (Signed)
Obstetrics Admission History & Physical   CC: elevated blood pressures  HPI:  35 y.o. [id number] @ [redacted]w[redacted]d (06/25/2021, by Last Menstrual Period). Admitted on 06/09/2021:   Patient Active Problem List   Diagnosis Date Noted   Elevated blood pressure affecting pregnancy in third trimester, antepartum 06/09/2021   History of cesarean section 06/09/2021   Iron deficiency anemia 05/19/2021   Supervision of high-risk pregnancy, third trimester 01/14/2021   Marijuana abuse 10/13/2015   Schizophrenia (HCC) 10/07/2015   Manic depressive disorder (HCC) 10/07/2015   Obesity in pregnancy, antepartum 10/07/2015   Tobacco user 10/07/2015   Substance use disorder 10/07/2015   Episodic mood disorder (HCC) 04/18/2007   Migraine 04/04/2007     Presents for rising blood pressures over the last week, certainly high today in office.  Pt has h/o preeclampsia in past pregnancies. Denies headache or blurry vision, CP or SOB.  SHe has been anxious and less focused of late.  Took Adderral yesterday to try to help.  She also took Vicodin.  She has h/o substance abuse.  She has h/o CSx2, last one 2017.  Prior h/o gastric bypass.  O-.  [facility] at: at [facility]. Pregnancy complicated by  as above .  ROS: A review of systems was performed and negative, except as stated in the above HPI. PMHx:  Past Medical History:  Diagnosis Date   Anxiety    Asthma    Complication of anesthesia    I have a hard time coming out of general anesthesia, if I come out of it too fast, I get violent, I have to be eased out of it.   COPD (chronic obstructive pulmonary disease) (HCC)    HSV-2 (herpes simplex virus 2) infection    Hypertension    no meds   Iron deficiency anemia 05/19/2021   Manic depression (HCC)    Menorrhagia    Migraine    Postoperative anemia due to acute blood loss 11/29/2015   Due to blood loss due to cesarean delivery    PTSD (post-traumatic stress disorder)    Schizophrenia, schizo-affective (HCC)     Vaginal Pap smear, abnormal    PSHx:  Past Surgical History:  Procedure Laterality Date   ABDOMINAL SURGERY     CARPAL TUNNEL RELEASE     CESAREAN SECTION     CESAREAN SECTION N/A 11/26/2015   Procedure: CESAREAN SECTION;  Surgeon: Elenora Fender Ward, MD;  Location: [facility] ORS;  Service: Obstetrics;  Laterality: N/A;   cosmetic repairs     left arm, right knee and back.   LAPAROSCOPIC GASTRIC SLEEVE RESECTION     PANNICULECTOMY     TONSILLECTOMY     Medications:  Medications Prior to Admission  Medication Sig Dispense Refill Last Dose   albuterol (VENTOLIN HFA) 108 (90 Base) MCG/ACT inhaler Inhale 2 puffs into the lungs every 6 (six) hours as needed for wheezing or shortness of breath.   06/08/2021   folic acid (FOLVITE) 1 MG tablet Take 1 tablet (1 mg total) by mouth daily. 90 tablet 0 06/08/2021   ondansetron (ZOFRAN ODT) 4 MG disintegrating tablet Take 1 tablet (4 mg total) by mouth every 6 (six) hours as needed for nausea. 40 tablet 0 06/08/2021   Allergies: is allergic to penicillins, petroleum distillate, amitriptyline, eggs or egg-derived products, nsaids, sulfa antibiotics, and latex. OBHx:  OB History  Gravida Para Term Preterm AB Living  5 3 2 1 1 3   SAB IAB Ectopic Multiple Live Births  1 0 0  0 3    # Outcome Date GA Lbr Len/2nd Weight Sex Delivery Anes PTL Lv  5 Current           4 Term 11/26/15 [redacted]w[redacted]d  3140 g M CS-LTranv Spinal  LIV  3 Term 2011   2540 g M CS-LTranv   LIV  2 Preterm 2003   1814 g M Vag-Spont   LIV     Complications: Preeclampsia  1 SAB            [id number] except as detailed in HPI.Marland Kitchen  No family history of birth defects. Soc Hx: Recreational drug use: current: Adderral, Vicodin  Objective:   Vitals:   06/09/21 1653 06/09/21 1708  BP: (!) 167/114 (!) 157/94  Pulse: (!) 106 (!) 107  Resp:    Temp:     Constitutional: Well nourished, well developed female in no acute distress.  HEENT: normal Skin: Warm and dry.   Cardiovascular:Regular rate and rhythm.   Extremity: trace to 1+ bilateral pedal edema Respiratory: Clear to auscultation bilateral. Normal respiratory effort Abdomen: gravid, ND, FHT present, mild tenderness on exam Back: no CVAT Neuro: DTRs 2+, Cranial nerves grossly intact Psych: Alert and Oriented x3. No memory deficits. Normal mood and affect.  MS: normal gait, normal bilateral lower extremity ROM/strength/stability.  A NST procedure was performed with FHR monitoring and a normal baseline established, appropriate time of 20-40 minutes of evaluation, and accels >2 seen w 15x15 characteristics.  Results show a REACTIVE NST.    Perinatal info:  Blood type: O negative Rubella- Immune Varicella -Immune TDaP tetanus status unknown to the patient RPR NR / HIV Neg/ HBsAg Neg   Assessment & Plan:   35 y.o. [id number] @ [redacted]w[redacted]d, Admitted on 06/09/2021:Preeclampsia.  Mild but at term.  Prior h.o preeclampsia. Risks on pregnancy discussed, plan for R CS and tubal now. Plan post op magnesium for 24 hours  The risks of cesarean section discussed with the patient included but were not limited to: bleeding which may require transfusion or reoperation; infection which may require antibiotics; injury to bowel, bladder, ureters or other surrounding organs; injury to the fetus; need for additional procedures including hysterectomy in the event of a life-threatening hemorrhage; placental abnormalities wth subsequent pregnancies, incisional problems, thromboembolic phenomenon and other postoperative/anesthesia complications. The patient concurred with the proposed plan, giving informed written consent for the procedure.   The patient has been fully informed about all methods of contraception, both temporary and permanent. She understands that tubal ligation is meant to be permanent, absolute and irreversible. She was told that there is an approximately 1 in 400 chance of a pregnancy in the future after tubal  ligation. She was told the short and long term complications of tubal ligation. She understands the risks from this surgery include, but are not limited to, the risks of anesthesia, hemorrhage, infection, perforation, and injury to adjacent structures, bowel, bladder and blood vessels.    Barnett Applebaum, MD, [facility], [facility] 06/09/2021  5:37 PM

## 2021-06-09 NOTE — Progress Notes (Signed)
Obstetric H&P   Chief Complaint: ROB, C-section Scheduling  Prenatal Care Provider: [facility]  History of Present Illness: 35 y.o. [id number] [redacted]w[redacted]d by 06/25/2021, by Last Menstrual Period presenting to for [place] visit and C-section & BTL scheduling.  +FM, no LOF, no VB, no contractions.  Pregnancy notable for anxiety, low lying posterior placenta resolved on follow up imaging, and history of gastric sleeve.     Pregravid weight 140 lb (63.5 kg) Total Weight Gain 44 lb (20 kg)  pregnancy 5 Problems (from 01/13/21 to present)     Problem Noted Resolved   Supervision of high-risk pregnancy, third trimester 01/14/2021 by Rod Can, CNM No   Overview Addendum 05/04/2021 11:07 AM by Homero Fellers, MD     Nursing Staff Provider  Office Location  [facility] Dating  lmp = 20 [place]  Language  English Anatomy US  Complete Low lying placenta, 11% growth  Flu Vaccine  05/04/2021 Genetic Screen  NIPS: declined  TDaP vaccine   05/04/2021 Hgb A1C or  GTT Early : NA Third trimester : nml- 71  Covid    LAB RESULTS   Rhogam  04/05/2021 Blood Type O/Negative/-- (08/04 1656)   Feeding Plan Formula Antibody Negative (10/17 1047)  Contraception  tubal Rubella 3.87 (08/04 1656)  Circumcision  RPR Non Reactive (10/17 1047)   Pediatrician   HBsAg Negative (08/04 1656)   Support Person Legrand Como HIV Non Reactive (10/17 1047)  Prenatal Classes  Varicella immune    GBS  (For PCN allergy, check sensitivities)   BTL Consent 05/04/2021    VBAC Consent Desires repeat Pap  01/2021 ASCUS HPV+ [ ]  COLPO    Hgb Electro    Pelvis Tested  CF      SMA      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 History of gastric sleeve Anemia in pregnancy Itching in pregnancy bile acids pending Mental health  history Marijuana use in pregnancy           Review of Systems: 10 point review of systems negative unless otherwise noted in HPI  Past Medical History: Patient Active Problem List   Diagnosis Date Noted   Iron deficiency anemia 05/19/2021   Supervision of high-risk pregnancy, third trimester 01/14/2021     Nursing Staff Provider  Office Location  [facility] Dating  lmp = 20 [place]  Language  English Anatomy US  Complete Low lying placenta, 11% growth  Flu Vaccine  05/04/2021 Genetic Screen  NIPS: declined  TDaP vaccine   05/04/2021 Hgb A1C or  GTT Early : NA Third trimester : nml- 71  Covid    LAB RESULTS   Rhogam  04/05/2021 Blood Type O/Negative/-- (08/04 1656)   Feeding Plan Formula Antibody Negative (10/17 1047)  Contraception  tubal Rubella 3.87 (08/04 1656)  Circumcision  RPR Non Reactive (10/17 1047)   Pediatrician   HBsAg Negative (08/04 1656)   Support Person Legrand Como HIV Non Reactive (10/17 1047)  Prenatal Classes  Varicella immune    GBS  (For PCN allergy, check sensitivities)   BTL Consent  05/04/2021    VBAC Consent Desires repeat Pap  01/2021 ASCUS HPV+ [ ]  COLPO    Hgb Electro    Pelvis Tested  CF      SMA      Hx gastric sleeve/has anxiety   History of  prior C-section history Desire for sterilization Low-lying posterior placenta History of chronic hypertension currently controlled without medication History of preeclampsia 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   LAPAROSCOPIC GASTRIC SLEEVE RESECTION     PANNICULECTOMY     TONSILLECTOMY      Past Obstetric History: # 1 - Date: None, Sex: None, Weight: None, GA: None, Delivery: None, Apgar1: None, Apgar5: None, Living: None, Birth Comments: None  # 2 - Date: 2003, Sex: Female, Weight: 4 lb (1.814 kg), GA: None, Delivery: Vaginal, Spontaneous, Apgar1: None, Apgar5: None, Living: Living, Birth Comments: None  # 3 - Date: 2011, Sex: Female, Weight: 5 lb 9.6 oz (2.54 kg), GA: None, Delivery: C-Section, Low Transverse, Apgar1: None, Apgar5: None, Living: Living, Birth Comments: None  # 4 - Date: 11/26/15, Sex: Female, Weight: 6 lb 14.8 oz (3.14 kg), GA: [redacted]w[redacted]d, Delivery: C-Section, Low Transverse, Apgar1: 8, Apgar5: 9, Living: Living, Birth Comments: None  # 5 - Date: None, Sex: None, Weight: None, GA: None, Delivery: None, Apgar1: None, Apgar5: None, Living: None, Birth Comments: None   Past Gynecologic History:  Family History: Family History  Problem Relation Age of Onset   Cancer Mother    Hypertension Mother    Heart disease Mother    Diabetes Mother    Lung disease Mother    Clotting disorder Mother    Bipolar disorder Mother    Mental illness Sister        SCHIZOPHRENIA    Social History: Social History   Socioeconomic History   Marital status: Soil scientist    Spouse name: Not on file   Number of children: Not on file   Years of education: Not on file   Highest education level: Not on file  Occupational  History   Not on file  Tobacco Use   Smoking status: Every Day    Packs/day: 0.50    Types: Cigarettes   Smokeless tobacco: Never  Vaping Use   Vaping Use: Never used  Substance and Sexual Activity   Alcohol use: Not Currently   Drug use: Not Currently    Types: Marijuana, Cocaine    Comment: Molly in past.   Sexual activity: Yes  Other Topics Concern   Not on file  Social History Narrative   Not on file   Social Determinants of Health   Financial Resource Strain: Not on file  Food Insecurity: Not on file  Transportation Needs: Not on file  Physical Activity: Not  on file  Stress: Not on file  Social Connections: Not on file  Intimate Partner Violence: Not At Risk   Fear of Current or Ex-Partner: No   Emotionally Abused: No   Physically Abused: No   Sexually Abused: No    Medications: Prior to Admission medications   Medication Sig Start Date End Date Taking? Authorizing Provider  albuterol (VENTOLIN HFA) 108 (90 Base) MCG/ACT inhaler Inhale 2 puffs into the lungs every 6 (six) hours as needed for wheezing or shortness of breath. 06/17/20   [provider]  folic acid (FOLVITE) 1 MG tablet Take 1 tablet (1 mg total) by mouth daily. 05/19/21   Rickard Patience, MD  ondansetron (ZOFRAN ODT) 4 MG disintegrating tablet Take 1 tablet (4 mg total) by mouth every 6 (six) hours as needed for nausea. 05/17/21   Nadara Mustard, MD    Allergies: Allergies  Allergen Reactions   Penicillins Shortness Of Breath   Petroleum Distillate Rash   Amitriptyline     Heavy sedation    Eggs Or Egg-Derived Products Nausea And Vomiting and Other (See Comments)    "Feels like rocks in my stomach."   Nsaids     No oral NSAIDS   Sulfa Antibiotics     Pt can not recall reaction    Latex Rash    Physical Exam: Vitals: Blood pressure (!) 151/98, weight 182 lb (82.6 kg), last menstrual period 09/18/2020.   FHT: 140 General: NAD HEENT: normocephalic, anicteric Pulmonary: No  increased work of breathing Cardiovascular: RRR, distal pulses 2+ Abdomen: Gravid, non-tender Leopolds: vtx Genitourinary: deferred Extremities: no edema, erythema, or tenderness Neurologic: Grossly intact Psychiatric: mood appropriate, affect full  Labs: No results found for this or any previous visit (from the past 24 hour(s)).  Assessment: 35 y.o. [id number] [redacted]w[redacted]d by 06/25/2021, by Last Menstrual Period presenting for C-section scheduling   Plan: 1) The patient was counseled regarding risk and benefits to proceeding with Cesarean section to expedite delivery.  Risk of cesarean section were discussed including risk of bleeding and need for potential intraoperative or postoperative blood transfusion with a rate of approximately 5% quoted for all Cesarean sections, risk of injury to adjacent organs including but not limited to bowl and bladder, the need for additional surgical procedures to address such injuries, and the risk of infection.   - currently scheduled for 12/29 only [redacted]w[redacted]d so looking at moving to 12/30 [redacted]w[redacted]d  35 y.o. [id number]  with undesired fertility, desires permanent sterilization.  Other reversible forms of contraception were discussed with patient; she declines all other modalities. Permanent nature of as well as associated risks of the procedure discussed with patient including but not limited to: risk of regret, permanence of method, bleeding, infection, injury to surrounding organs and need for additional procedures.  Failure risk of 0.5-1% with increased risk of ectopic gestation if pregnancy occurs was also discussed with patient.     2) Fetus -+FHT  3) PNL - Blood type O/Negative/-- (08/04 1656) / Anti-bodyscreen Negative (10/17 1047) / Rubella 3.87 (08/04 1656) / Varicella Immune / RPR Non Reactive (10/17 1047) / HBsAg Negative (08/04 1656) / HIV Non Reactive (10/17 1047) / 1-hr OGTT 71 / GBS   - GBS collected has not been see in 3 weeks  4) ELEVATED BP today - is sick will  send to L&D for preeclampsia evaluation  5) Immunization History -  Immunization History  Administered Date(s) Administered   Tdap 05/04/2021    6) Disposition - pending  delivery  Malachy Mood, MD, [facility], [facility] 06/09/2021, 10:37 AM

## 2021-06-09 NOTE — Discharge Summary (Signed)
OB Discharge Summary     Patient Name: Michelle Kelley DOB: [birth year] MRN: [id number]  Date of admission: 06/09/2021 Delivering MD: Natale Milch, MD  Date of Delivery: 06/09/2021  Date of discharge: 06/12/2021  Admitting diagnosis: Elevated blood pressure affecting pregnancy in third trimester, antepartum [O16.3] History of cesarean section [Z98.891] Intrauterine pregnancy: [redacted]w[redacted]d     Secondary diagnosis: Preeclampsia     Discharge diagnosis: Term Pregnancy Delivered, Preeclampsia (mild), and preeclasmpsia , Reasons for cesarean section  Elective repeat                         Hospital course:  Sceduled C/S   35 y.o. yo [phone] at [redacted]w[redacted]d was admitted to the hospital 06/09/2021 for elevated blood pressures and diagnosis or preeclamspia, so preceded to elective repet scheduled cesarean section with the following indication:Elective Repeat and preeclasmpsia .Delivery details are as follows:  Membrane Rupture Time/Date: 7:06 PM ,06/09/2021   Delivery Method:C-Section, Low Transverse  Details of operation can be found in separate operative note.  Patient had an uncomplicated postpartum course.  She is ambulating, tolerating a regular diet, passing flatus, and urinating well. Patient is discharged home in stable condition on  06/12/21        Newborn Data: Birth date:06/09/2021  Birth time:7:06 PM  Gender:Female  Living status:Living  Apgars:9 ,9  Weight:2540 g                                                                    Post partum procedures:none  Complications: None  Physical exam on 06/12/2021: Vitals:   06/11/21 1930 06/11/21 2203 06/12/21 0325 06/12/21 0756  BP: 134/87 (!) 141/72 115/72 123/73  Pulse: 63 83 73 85  Resp: 18  18 20   Temp: 98.9 F (37.2 C)  97.6 F (36.4 C) 97.7 F (36.5 C)  TempSrc: Oral  Oral Oral  SpO2:   98% 99%  Weight:      Height:       General: alert, cooperative, and no distress Lochia: appropriate Uterine Fundus: firm Incision:  Healing well with no significant drainage DVT Evaluation: No evidence of DVT seen on physical exam.  Labs: Lab Results  Component Value Date   WBC 15.9 (H) 06/10/2021   HGB 7.3 (L) 06/10/2021   HCT 22.6 (L) 06/10/2021   MCV 79.9 (L) 06/10/2021   PLT 267 06/10/2021   CMP Latest Ref Rng & Units 06/09/2021  Glucose 70 - 99 mg/dL 88  BUN 6 - 20 mg/dL 8  Creatinine 06/11/2021 - 1.47 mg/dL 8.29  Sodium 5.62 - 130 mmol/L 138  Potassium 3.5 - 5.1 mmol/L 3.2(L)  Chloride 98 - 111 mmol/L 108  CO2 22 - 32 mmol/L 22  Calcium 8.9 - 10.3 mg/dL 865)  Total Protein 6.5 - 8.1 g/dL 6.0(L)  Total Bilirubin 0.3 - 1.2 mg/dL 0.7  Alkaline Phos 38 - 126 U/L 239(H)  AST 15 - 41 U/L 31  ALT 0 - 44 U/L 17    Discharge instruction: per After Visit Summary.  Medications:  Allergies as of 06/12/2021       Reactions   Penicillins Shortness Of Breath   Petroleum Distillate Rash   Amitriptyline    Heavy sedation  Nsaids    No oral NSAIDS   Sulfa Antibiotics    Pt can not recall reaction    Latex Rash        Medication List     TAKE these medications    acetaminophen 500 MG tablet Commonly known as: TYLENOL Take 2 tablets (1,000 mg total) by mouth every 6 (six) hours as needed for fever or headache.   albuterol 108 (90 Base) MCG/ACT inhaler Commonly known as: VENTOLIN HFA Inhale 2 puffs into the lungs every 6 (six) hours as needed for wheezing or shortness of breath.   docusate sodium 100 MG capsule Commonly known as: Colace Take 1 capsule (100 mg total) by mouth 2 (two) times daily.   folic acid 1 MG tablet Commonly known as: FOLVITE Take 1 tablet (1 mg total) by mouth daily.   ondansetron 4 MG disintegrating tablet Commonly known as: Zofran ODT Take 1 tablet (4 mg total) by mouth every 6 (six) hours as needed for nausea.   oxyCODONE 5 MG immediate release tablet Commonly known as: Oxy IR/ROXICODONE Take 1-2 tablets (5-10 mg total) by mouth every 4 (four) hours as needed for  moderate pain.               Discharge Care Instructions  (From admission, onward)           Start     Ordered   06/12/21 0000  Discharge wound care:       Comments: SHOWER DAILY Wash incision gently with soap and water.  Call office with any drainage, redness, or firmness of the incision.   06/12/21 1158            Diet: routine diet  Activity: Advance as tolerated. Pelvic rest for 6 weeks.   Outpatient follow up:  Follow-up Information     Nadara Mustard, MD. Schedule an appointment as soon as possible for a visit in 2 week(s).   Specialty: Obstetrics and Gynecology Why: For Post Op Contact information: [address] [place] [address] [phone]                   Postpartum contraception:  BTL done Rhogam Given postpartum: yes Rubella vaccine given postpartum: no Varicella vaccine given postpartum: no TDaP given antepartum or postpartum: Yes  Newborn Data: Live born female  Birth Weight:   APGAR: ,   Newborn Delivery   Birth date/time: 06/09/2021 19:06:00 Delivery type: C-Section, Low Transverse C-section categorization: Repeat       Baby Feeding: Bottle  Disposition:home with mother  SIGNED:   Natale Milch, MD 06/12/2021 12:00 PM

## 2021-06-09 NOTE — Anesthesia Procedure Notes (Signed)
Date/Time: 06/09/2021 6:54 PM Performed by: Ginger Carne, CRNA Pre-anesthesia Checklist: Patient identified, Emergency Drugs available, Suction available, Patient being monitored and Timeout performed Patient Re-evaluated:Patient Re-evaluated prior to induction Oxygen Delivery Method: Nasal cannula Preoxygenation: Pre-oxygenation with 100% oxygen

## 2021-06-09 NOTE — Anesthesia Preprocedure Evaluation (Signed)
Anesthesia Evaluation  Patient identified by MRN, date of birth, ID band Patient awake    Reviewed: Allergy & Precautions, NPO status , Patient's Chart, lab work & pertinent test results  History of Anesthesia Complications (+) Emergence Delirium and history of anesthetic complications (agitated and violent wakeups from general anesthesia)  Airway Mallampati: III   Neck ROM: Full    Dental   Missing molars:   Pulmonary asthma , COPD, Current Smoker (1 ppd)Patient did not abstain from smoking.,    Pulmonary exam normal breath sounds clear to auscultation       Cardiovascular hypertension (chronic, with superimposed PIH), Normal cardiovascular exam Rhythm:Regular Rate:Normal     Neuro/Psych  Headaches, PSYCHIATRIC DISORDERS Anxiety Schizophrenia Hx cocaine use, last in 2015; current daily marijuana use; adderall and vicodin use    GI/Hepatic Hx gastric sleeve   Endo/Other  Obesity   Renal/GU negative Renal ROS     Musculoskeletal   Abdominal   Peds  Hematology  (+) Blood dyscrasia, anemia ,   Anesthesia Other Findings 35 yo [id number] at 17 5/7 with new diagnosis PIH  Reproductive/Obstetrics                             Anesthesia Physical Anesthesia Plan  ASA: 3 and emergent  Anesthesia Plan: Spinal   Post-op Pain Management:    Induction: Intravenous  PONV Risk Score and Plan: 1 and Ondansetron and Treatment may vary due to age or medical condition  Airway Management Planned: Natural Airway  Additional Equipment:   Intra-op Plan:   Post-operative Plan:   Informed Consent: I have reviewed the patients History and Physical, chart, labs and discussed the procedure including the risks, benefits and alternatives for the proposed anesthesia with the patient or authorized representative who has indicated his/her understanding and acceptance.       Plan Discussed with:  CRNA  Anesthesia Plan Comments: (Plan for spinal, GETA backup.  Patient consented for risks of anesthesia including but not limited to:  - adverse reactions to medications - damage to eyes, teeth, lips or other oral mucosa - nerve damage due to positioning  - sore throat or hoarseness - headache, bleeding, infection, nerve damage 2/2 spinal - damage to heart, brain, nerves, lungs, other parts of body or loss of life  Informed patient about role of CRNA in peri- and intra-operative care.  Patient voiced understanding.)        Anesthesia Quick Evaluation

## 2021-06-09 NOTE — OB Triage Note (Signed)
Patient is G5P3, [redacted]w[redacted]d that presents from the doctor office for [facility] evaluation. Patient denies any bleeding, LOF, or CTX. +FM. Patient also states that she has had a productive cough for a couple weeks.

## 2021-06-09 NOTE — Anesthesia Postprocedure Evaluation (Signed)
Anesthesia Post Note  Patient: Michelle Kelley  Procedure(s) Performed: CESAREAN SECTION WITH BILATERAL TUBAL LIGATION (Bilateral)  Patient location during evaluation: L&D Anesthesia Type: Spinal Level of consciousness: oriented and awake and alert Pain management: pain level controlled Vital Signs Assessment: post-procedure vital signs reviewed and stable Respiratory status: spontaneous breathing and respiratory function stable Cardiovascular status: blood pressure returned to baseline and stable Postop Assessment: no headache, no backache, no apparent nausea or vomiting and spinal receding Anesthetic complications: no   No notable events documented.   Last Vitals:  Vitals:   06/09/21 2259 06/09/21 2300  BP:  (!) 161/96  Pulse: 70 73  Resp: 14 15  Temp:    SpO2: 98% 98%    Last Pain:  Vitals:   06/09/21 2100  TempSrc: Axillary  PainSc:                  Reed Breech

## 2021-06-10 ENCOUNTER — Encounter: Payer: Self-pay | Admitting: Oncology

## 2021-06-10 ENCOUNTER — Encounter: Payer: Self-pay | Admitting: Obstetrics and Gynecology

## 2021-06-10 LAB — CBC
HCT: 22.6 % — ABNORMAL LOW (ref 36.0–46.0)
Hemoglobin: 7.3 g/dL — ABNORMAL LOW (ref 12.0–15.0)
MCH: 25.8 pg — ABNORMAL LOW (ref 26.0–34.0)
MCHC: 32.3 g/dL (ref 30.0–36.0)
MCV: 79.9 fL — ABNORMAL LOW (ref 80.0–100.0)
Platelets: 267 10*3/uL (ref 150–400)
RBC: 2.83 MIL/uL — ABNORMAL LOW (ref 3.87–5.11)
RDW: 20 % — ABNORMAL HIGH (ref 11.5–15.5)
WBC: 15.9 10*3/uL — ABNORMAL HIGH (ref 4.0–10.5)
nRBC: 0.4 % — ABNORMAL HIGH (ref 0.0–0.2)

## 2021-06-10 LAB — FETAL SCREEN: Fetal Screen: NEGATIVE

## 2021-06-10 LAB — RPR: RPR Ser Ql: NONREACTIVE

## 2021-06-10 MED ORDER — LABETALOL HCL 200 MG PO TABS
200.0000 mg | ORAL_TABLET | Freq: Two times a day (BID) | ORAL | Status: DC
  Administered 2021-06-10 – 2021-06-12 (×4): 200 mg via ORAL
  Filled 2021-06-10 (×2): qty 1
  Filled 2021-06-10: qty 2
  Filled 2021-06-10: qty 1
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
## 2021-06-10 NOTE — Progress Notes (Signed)
CPS saw patient and formulated plan for discharge.Patient verbalized understanding of plan. Plan is for baby to be discharged with father of baby or friend of mom- Michelle Kelley.

## 2021-06-10 NOTE — Progress Notes (Signed)
Subjective:  Doing well no concerns.  Pain well controlled on oral analgesics.  No HA or vision changes.  Tolerating po.  Appropriate lochia  Objective:  Vital signs in last 24 hours: Temp:  [96.8 F (36 C)-98.2 F (36.8 C)] 96.8 F (36 C) (12/22 0408) Pulse Rate:  [52-145] 92 (12/22 0619) Resp:  [9-36] 18 (12/22 0900) BP: (108-171)/(53-116) 150/91 (12/22 0619) SpO2:  [94 %-100 %] 98 % (12/22 0700) Weight:  [82.6 kg] 82.6 kg (12/21 1422)    Intake/Output      12/21 0701 12/22 0700 12/22 0701 12/23 0700   P.O. 1240 120   I.V. (mL/kg) 1578 (19.1) 249.4 (3)   IV Piggyback 160.3    Total Intake(mL/kg) 2978.2 (36.1) 369.4 (4.5)   Urine (mL/kg/hr) 1100 125 (0.5)   Blood 480    Total Output 1580 125   Net +1398.2 +244.4          General: NAD Pulmonary: no increased work of breathing Abdomen: non-distended, non-tender, fundus firm at level of umbilicus Incision: D/C/I Extremities: no edema, no erythema, no tenderness  Results for orders placed or performed during the hospital encounter of 06/09/21 (from the past 72 hour(s))  Urine Drug Screen, Qualitative ([facility] only)     Status: Abnormal   Collection Time: 06/09/21  2:09 PM  Result Value Ref Range   Tricyclic, Ur Screen NONE DETECTED NONE DETECTED   Amphetamines, Ur Screen POSITIVE (A) NONE DETECTED   MDMA (Ecstasy)Ur Screen NONE DETECTED NONE DETECTED   Cocaine Metabolite,Ur Bray NONE DETECTED NONE DETECTED   Opiate, Ur Screen POSITIVE (A) NONE DETECTED   Phencyclidine (PCP) Ur S NONE DETECTED NONE DETECTED   Cannabinoid 50 Ng, Ur [place] NONE DETECTED NONE DETECTED   Barbiturates, Ur Screen NONE DETECTED NONE DETECTED   Benzodiazepine, Ur Scrn NONE DETECTED NONE DETECTED   Methadone Scn, Ur NONE DETECTED NONE DETECTED    Comment: (NOTE) Tricyclics + metabolites, urine    Cutoff 1000 ng/mL Amphetamines + metabolites, urine  Cutoff 1000 ng/mL MDMA (Ecstasy), urine              Cutoff 500 ng/mL Cocaine Metabolite, urine           Cutoff 300 ng/mL Opiate + metabolites, urine        Cutoff 300 ng/mL Phencyclidine (PCP), urine         Cutoff 25 ng/mL Cannabinoid, urine                 Cutoff 50 ng/mL Barbiturates + metabolites, urine  Cutoff 200 ng/mL Benzodiazepine, urine              Cutoff 200 ng/mL Methadone, urine                   Cutoff 300 ng/mL  The urine drug screen provides only a preliminary, unconfirmed analytical test result and should not be used for non-medical purposes. Clinical consideration and professional judgment should be applied to any positive drug screen result due to possible interfering substances. A more specific alternate chemical method must be used in order to obtain a confirmed analytical result. Gas chromatography / mass spectrometry (GC/MS) is the preferred confirm atory method. Performed at [facility], [address] [place], [place] [address]   Resp Panel by RT-PCR (Flu A&B, Covid) Urine, Clean Catch     Status: None   Collection Time: 06/09/21  2:10 PM   Specimen: Urine, Clean Catch; Nasopharyngeal(NP) swabs in vial transport medium  Result Value Ref Range   SARS Coronavirus 2 by RT PCR NEGATIVE NEGATIVE    Comment: (NOTE) SARS-CoV-2 target nucleic acids are NOT DETECTED.  The SARS-CoV-2 RNA is generally detectable in upper respiratory specimens during the acute phase of infection. The lowest concentration of SARS-CoV-2 viral copies this assay can detect is 138 copies/mL. A negative result does not preclude SARS-Cov-2 infection and should not be used as the sole basis for treatment or other patient management decisions. A negative result may occur with  improper specimen collection/handling, submission of specimen other than nasopharyngeal swab, presence of viral mutation(s) within the areas targeted by this assay, and inadequate number of viral copies(<138 copies/mL). A negative result must be combined with clinical observations, patient history, and  epidemiological information. The expected result is Negative.  Fact Sheet for Patients:  BloggerCourse.com  Fact Sheet for Healthcare Providers:  SeriousBroker.it  This test is no t yet approved or cleared by the [place] FDA and  has been authorized for detection and/or diagnosis of SARS-CoV-2 by FDA under an Emergency Use Authorization (EUA). This EUA will remain  in effect (meaning this test can be used) for the duration of the COVID-19 declaration under Section 564(b)(1) of the Act, 21 U.S.C.section 360bbb-3(b)(1), unless the authorization is terminated  or revoked sooner.       Influenza A by PCR NEGATIVE NEGATIVE   Influenza B by PCR NEGATIVE NEGATIVE    Comment: (NOTE) The Xpert Xpress SARS-CoV-2/FLU/RSV plus assay is intended as an aid in the diagnosis of influenza from Nasopharyngeal swab specimens and should not be used as a sole basis for treatment. Nasal washings and aspirates are unacceptable for Xpert Xpress SARS-CoV-2/FLU/RSV testing.  Fact Sheet for Patients: BloggerCourse.com  Fact Sheet for Healthcare Providers: SeriousBroker.it  This test is not yet approved or cleared by the [place] FDA and has been authorized for detection and/or diagnosis of SARS-CoV-2 by FDA under an Emergency Use Authorization (EUA). This EUA will remain in effect (meaning this test can be used) for the duration of the COVID-19 declaration under Section 564(b)(1) of the Act, 21 U.S.C. section 360bbb-3(b)(1), unless the authorization is terminated or revoked.  Performed at [facility], [address] [place], [place] [address]   Protein / creatinine ratio, urine     Status: Abnormal   Collection Time: 06/09/21  2:10 PM  Result Value Ref Range   Creatinine, Urine 190 mg/dL   Total Protein, Urine 73 mg/dL    Comment: NO NORMAL RANGE ESTABLISHED FOR THIS TEST    Protein Creatinine Ratio 0.38 (H) 0.00 - 0.15 mg/mg[Cre]    Comment: Performed at [facility], [address] [place], [place] [address]  Urinalysis, Routine w reflex microscopic Urine, Clean Catch     Status: Abnormal   Collection Time: 06/09/21  2:10 PM  Result Value Ref Range   Color, Urine YELLOW YELLOW   APPearance CLOUDY (A) CLEAR   Specific Gravity, Urine 1.025 1.005 - 1.030   pH 5.5 5.0 - 8.0   Glucose, UA NEGATIVE NEGATIVE mg/dL   Hgb urine dipstick NEGATIVE NEGATIVE   Bilirubin Urine SMALL (A) NEGATIVE   Ketones, ur TRACE (A) NEGATIVE mg/dL   Protein, ur 098 (A) NEGATIVE mg/dL   Nitrite NEGATIVE NEGATIVE   Leukocytes,Ua SMALL (A) NEGATIVE   RBC / HPF 0-5 0 - 5 RBC/hpf   WBC, UA 6-10 0 - 5 WBC/hpf   Bacteria, UA RARE (A) NONE SEEN   Squamous Epithelial / LPF >50 (  H) 0 - 5   Mucus PRESENT    Ca Oxalate Crys, UA PRESENT    Non Squamous Epithelial PRESENT (A) NONE SEEN    Comment: Performed at [facility], [address] [place], [place] [address]  Comprehensive metabolic panel     Status: Abnormal   Collection Time: 06/09/21  2:55 PM  Result Value Ref Range   Sodium 138 135 - 145 mmol/L   Potassium 3.2 (L) 3.5 - 5.1 mmol/L   Chloride 108 98 - 111 mmol/L   CO2 22 22 - 32 mmol/L   Glucose, Bld 88 70 - 99 mg/dL    Comment: Glucose reference range applies only to samples taken after fasting for at least 8 hours.   BUN 8 6 - 20 mg/dL   Creatinine, Ser 7.61 0.44 - 1.00 mg/dL   Calcium 8.8 (L) 8.9 - 10.3 mg/dL   Total Protein 6.0 (L) 6.5 - 8.1 g/dL   Albumin 2.5 (L) 3.5 - 5.0 g/dL   AST 31 15 - 41 U/L   ALT 17 0 - 44 U/L   Alkaline Phosphatase 239 (H) 38 - 126 U/L   Total Bilirubin 0.7 0.3 - 1.2 mg/dL   GFR, Estimated >95 >09 mL/min    Comment: (NOTE) Calculated using the CKD-EPI Creatinine Equation (2021)    Anion gap 8 5 - 15    Comment: Performed at [facility], [address] [place], [place] [address]  CBC     Status: Abnormal    Collection Time: 06/09/21  2:55 PM  Result Value Ref Range   WBC 11.6 (H) 4.0 - 10.5 K/uL   RBC 3.54 (L) 3.87 - 5.11 MIL/uL   Hemoglobin 9.0 (L) 12.0 - 15.0 g/dL   HCT 24.5 (L) 80.9 - 98.3 %   MCV 79.4 (L) 80.0 - 100.0 fL   MCH 25.4 (L) 26.0 - 34.0 pg   MCHC 32.0 30.0 - 36.0 g/dL   RDW 38.2 (H) 50.5 - 39.7 %   Platelets 258 150 - 400 K/uL   nRBC 1.2 (H) 0.0 - 0.2 %    Comment: Performed at [facility], [address] [place], [place] [address]  Rapid HIV screen (HIV 1/2 Ab+Ag)     Status: None   Collection Time: 06/09/21  5:44 PM  Result Value Ref Range   HIV-1 P24 Antigen - HIV24 NON REACTIVE NON REACTIVE    Comment: (NOTE) Detection of p24 may be inhibited by biotin in the sample, causing false negative results in acute infection.    HIV 1/2 Antibodies NON REACTIVE NON REACTIVE   Interpretation (HIV Ag Ab)      A non reactive test result means that HIV 1 or HIV 2 antibodies and HIV 1 p24 antigen were not detected in the specimen.    Comment: Performed at [facility], [address] [place], [place] [address]  Type and screen [facility]     Status: None   Collection Time: 06/09/21  5:44 PM  Result Value Ref Range   ABO/RH(D) O NEG    Antibody Screen POS    Sample Expiration 06/12/2021,2359    Antibody Identification      PASSIVELY ACQUIRED ANTI-D Performed at [facility], [address] [place], [place] [address]     Immunization History  Administered Date(s) Administered   Tdap 05/04/2021    Assessment:   35 y.o. [id number] postoperativeday # 1 RTLCS & BTL   Plan:  )  Acute blood loss anemia - hemodynamically stable and asymptomatic - po ferrous sulfate  2) Blood Type --/--/O NEG (12/21 1744) / Rubella 3.87 (08/04 1656) / Varicella Immune  3) TDAP status up to date  4) Preeclampsia with severe features  - finish 24-hr course magnesium sulfate - BP improved currently on no antihypertensives  5)   Education given regarding options for contraception, as well as compatibility with breast feeding if applicable.  S/p BTL  6) Disposition anticipate dicharge POD3  Vena Austria, MD, [facility], [facility] 06/10/2021, 10:20 AM

## 2021-06-11 ENCOUNTER — Inpatient Hospital Stay: Admission: RE | Admit: 2021-06-11 | Payer: No Typology Code available for payment source | Source: Ambulatory Visit

## 2021-06-11 LAB — SURGICAL PATHOLOGY

## 2021-06-11 LAB — STREP GP B NAA: Strep Gp B NAA: NEGATIVE

## 2021-06-11 MED ORDER — SODIUM CHLORIDE 0.9 % IV SOLN
125.0000 mg | Freq: Every day | INTRAVENOUS | Status: DC
  Administered 2021-06-11: 12:00:00 125 mg via INTRAVENOUS
  Filled 2021-06-11 (×3): qty 10

## 2021-06-11 MED ORDER — POTASSIUM CHLORIDE 20 MEQ PO PACK
20.0000 meq | PACK | Freq: Two times a day (BID) | ORAL | Status: DC
  Administered 2021-06-11: 15:00:00 20 meq via ORAL
  Filled 2021-06-11 (×4): qty 1

## 2021-06-11 MED ORDER — RHO D IMMUNE GLOBULIN 1500 UNIT/2ML IJ SOSY
300.0000 ug | PREFILLED_SYRINGE | Freq: Once | INTRAMUSCULAR | Status: AC
  Administered 2021-06-11: 10:00:00 300 ug via INTRAVENOUS
  Filled 2021-06-11: qty 2

## 2021-06-11 MED ORDER — POTASSIUM CHLORIDE 10 MEQ/100ML IV SOLN
10.0000 meq | INTRAVENOUS | Status: AC
  Filled 2021-06-11 (×3): qty 100

## 2021-06-11 MED ORDER — ACETAMINOPHEN 500 MG PO TABS
1000.0000 mg | ORAL_TABLET | Freq: Four times a day (QID) | ORAL | Status: DC | PRN
  Administered 2021-06-11 – 2021-06-12 (×2): 1000 mg via ORAL
  Filled 2021-06-11 (×2): qty 2

## 2021-06-11 NOTE — Progress Notes (Signed)
Obstetric Postpartum/PostOperative Daily Progress Note Subjective:  35 y.o. [id number] post-operative day # 2 status post repeat cesarean delivery.  She is ambulating, is tolerating po, is voiding spontaneously.  Her pain is well controlled on PO pain medications and On Q pump. Her lochia is less than menses. She prefers to stay until tomorrow. She is asymptomatic with current hemoglobin. She refuses PO supplements and has Fe infusion next Wednesday.   Medications SCHEDULED MEDICATIONS   labetalol  200 mg Oral BID   prenatal multivitamin  1 tablet Oral Q1200   scopolamine  1 patch Transdermal Once   senna-docusate  2 tablet Oral Q24H   simethicone  80 mg Oral TID PC   Tdap  0.5 mL Intramuscular Once    MEDICATION INFUSIONS   bupivacaine ON-Q pain pump     ferric gluconate (FERRLECIT) IVPB     lactated ringers Stopped (06/10/21 1910)   magnesium sulfate Stopped (06/10/21 1910)   naLOXone (NARCAN) adult infusion for PRURITIS     potassium chloride      PRN MEDICATIONS  coconut oil, witch hazel-glycerin **AND** dibucaine, diphenhydrAMINE **OR** diphenhydrAMINE, labetalol **AND** labetalol **AND** labetalol **AND** hydrALAZINE **AND** Measure blood pressure, hydrOXYzine, menthol-cetylpyridinium, morphine injection, naloxone **AND** sodium chloride flush, naLOXone (NARCAN) adult infusion for PRURITIS, ondansetron (ZOFRAN) IV, oxyCODONE, simethicone, zolpidem    Objective:   Vitals:   06/10/21 2216 06/11/21 0107 06/11/21 0411 06/11/21 0838  BP: (!) 101/57 110/65 122/78 131/69  Pulse: 68 71 66 77  Resp: 18 18 18 20   Temp: 97.8 F (36.6 C) 98.3 F (36.8 C) 97.8 F (36.6 C) 98.6 F (37 C)  TempSrc: Oral Oral Oral Oral  SpO2: 96% 96% 97% 95%  Weight:      Height:        Current Vital Signs 24h Vital Sign Ranges  T 98.6 F (37 C) Temp  Avg: 98.1 F (36.7 C)  Min: 97.8 F (36.6 C)  Max: 98.6 F (37 C)  BP 131/69 BP  Min: 95/55  Max: 163/95  HR 77 Pulse  Avg: 65  Min: 58  Max: 78   RR 20 Resp  Avg: 18.7  Min: 18  Max: 20  SaO2 95 % Room Air SpO2  Avg: 97.9 %  Min: 95 %  Max: 100 %       24 Hour I/O Current Shift I/O  Time Ins Outs 12/22 0701 - 12/23 0700 In: 2362.2 [P.O.:874; I.V.:1488.2] Out: 1590 [Urine:1590] No intake/output data recorded.  General: NAD Pulmonary: no increased work of breathing Abdomen: non-distended, non-tender, fundus firm at level of umbilicus Inc: Clean/dry/intact, On Q with sero/sang drainage Extremities: no edema, no erythema, no tenderness  Labs:  Recent Labs  Lab 06/09/21 1455 06/10/21 0947  WBC 11.6* 15.9*  HGB 9.0* 7.3*  HCT 28.1* 22.6*  PLT 258 267     Assessment:   35 y.o. 06/12/21 postoperative day # 2 status post repeat cesarean section  Plan:  1) Acute blood loss anemia - hemodynamically stable and asymptomatic - IV ferric gluconate 125 mg daily  2) O NEG / Rubella 3.87 (08/04 1656)/ Varicella Immune  3) TDAP status given antepartum  4) Formula feeding/Contraception = bilateral tubal ligation  5) Hypokalemia: IV KCl 10 mEq x3  6) Preeclampsia: s/p 24h postpartum mag. Labetalol 200 mg BID  7) Disposition: continue current care   09-09-2004, CNM 06/11/2021 10:19 AM

## 2021-06-11 NOTE — TOC Progression Note (Signed)
Transition of Care [facility]) - Progression Note    Patient Details  Name: Michelle Kelley MRN: [id number] Date of Birth: [birth year]  Transition [facility] Care [facility]) CM/SW Contact  Pevely Cellar, RN Phone Number: 06/11/2021, 9:43 AM  Clinical Narrative:    Confirmed with Morrie Sheldon, CPS supervisor that social worker for this case is Genia Plants. No communication from [facility] with TOC at this time.         Expected Discharge Plan and Services                                                 Social Determinants of Health (SDOH) Interventions    Readmission Risk Interventions No flowsheet data found.

## 2021-06-11 NOTE — TOC Progression Note (Signed)
Transition of Care [facility]) - Progression Note    Patient Details  Name: Michelle Kelley MRN: [id number] Date of Birth: [birth year]  Transition of Care [facility]) CM/SW Contact  [place] Cellar, RN Phone Number: 06/11/2021, 5:29 PM  Clinical Narrative:    4:08pm Paged social worker on call for CPS to get clarification on infants ability to room in with mother due to safety plan per FOB that he is the one taking custody at discharge.    4:30pm: Received call back from CPS-states she will outreach to her supervisor and find out about infant rooming in with mom.    5:30pm: Confirmed with Helmut Muster, CPS infant can room in with mother but will discharge home with father.         Expected Discharge Plan and Services                                                 Social Determinants of Health (SDOH) Interventions    Readmission Risk Interventions No flowsheet data found.

## 2021-06-12 LAB — RHOGAM INJECTION: Unit division: 0

## 2021-06-12 MED ORDER — ACETAMINOPHEN 500 MG PO TABS: 1000.0000 mg | ORAL_TABLET | Freq: Four times a day (QID) | ORAL | 0 refills | Status: DC | PRN

## 2021-06-12 MED ORDER — OXYCODONE HCL 5 MG PO TABS: 5.0000 mg | ORAL_TABLET | ORAL | 0 refills | Status: DC | PRN

## 2021-06-12 MED ORDER — DOCUSATE SODIUM 100 MG PO CAPS: 100.0000 mg | ORAL_CAPSULE | Freq: Two times a day (BID) | ORAL | 2 refills | Status: AC

## 2021-06-12 NOTE — Progress Notes (Signed)
Patient discharged. Infant remains in room with FOB. Discharge instructions, prescriptions, and follow up appointments given to and reviewed with patient. Patient verbalized understanding.

## 2021-06-12 NOTE — Progress Notes (Addendum)
This RN alerted by NT that FOB is bringing baby to [facility] because MOB and FOB are leaving.  This RN educated parents that FOB needs to stay in room with newborn, per CPS and that while baby remains in newborn care, FOB needs to be present at all times. Parents very aggravated, stating they have been misinformed about the plan going forward and need to leave to see their children at home. Neo and CSW notified of parents wanting to leave. Social worker contacted CPS.  FOB stated he spoke to CPS worker, received approval to leave hospital and stated he was going to see his children and open presents and would be back to the hospital so that MOB could be discharged. MOB ok to stay in room with baby alone while FOB is gone in hospital. MOB very tearful, offered emotional support. Encouraged MOB to rest while baby is sleeping.

## 2021-06-12 NOTE — TOC Progression Note (Addendum)
Transition of Care [facility]) - Progression Note    Patient Details  Name: Michelle Kelley MRN: [id number] Date of Birth: 1985/09/02  Transition of Care [facility]) CM/SW Contact  Maree Krabbe, LCSW Phone Number: 06/12/2021, 11:09 AM  Clinical Narrative:   CPS is already involved in the case. Per RN mom is being dc and baby daddy is stating he is leaving with mom. CSW spoke with baby's dad and he states he is going to take mom home and see his other two kids and would be back this evening. CSW has called and requested a call back from CPS worker on call to determine if this would be considered abdoment.   11:45- CSW spoke with Helmut Muster with CPS who has been consulting with her Interior and spatial designer. Per Helmut Muster mom is allowed to be alone in the room with baby and care for baby. Therefore Helmut Muster called into the room and spoke with FOB and MOB and mom is going to stay and care for the baby while FOB is going to go visit his other two children and will return this evening. At that time mom can go home. Helmut Muster states she gave FOB and MOB her name and number to call if things change. If mom has no transport home after FOB arrives we can arranged taxi.      Expected Discharge Plan and Services                                                 Social Determinants of Health (SDOH) Interventions    Readmission Risk Interventions No flowsheet data found.

## 2021-06-15 ENCOUNTER — Inpatient Hospital Stay: Admission: RE | Admit: 2021-06-15 | Payer: No Typology Code available for payment source | Source: Ambulatory Visit

## 2021-06-16 ENCOUNTER — Inpatient Hospital Stay: Payer: No Typology Code available for payment source

## 2021-06-17 ENCOUNTER — Encounter: Payer: Self-pay | Admitting: Oncology

## 2021-06-17 ENCOUNTER — Encounter: Payer: PRIVATE HEALTH INSURANCE | Admitting: Obstetrics & Gynecology

## 2021-06-17 ENCOUNTER — Inpatient Hospital Stay
Admission: RE | Admit: 2021-06-17 | Payer: No Typology Code available for payment source | Source: Home / Self Care | Admitting: Obstetrics and Gynecology

## 2021-06-18 ENCOUNTER — Encounter: Payer: Self-pay | Admitting: Oncology

## 2021-06-22 ENCOUNTER — Ambulatory Visit: Payer: PRIVATE HEALTH INSURANCE | Admitting: Obstetrics and Gynecology

## 2021-06-23 ENCOUNTER — Inpatient Hospital Stay: Payer: Medicaid Other | Attending: Nurse Practitioner

## 2021-06-28 ENCOUNTER — Encounter: Payer: PRIVATE HEALTH INSURANCE | Admitting: Obstetrics and Gynecology

## 2021-07-12 ENCOUNTER — Other Ambulatory Visit: Payer: Self-pay | Admitting: *Deleted

## 2021-07-12 ENCOUNTER — Encounter: Payer: Self-pay | Admitting: Oncology

## 2021-07-12 DIAGNOSIS — D509 Iron deficiency anemia, unspecified: Secondary | ICD-10-CM

## 2021-07-19 ENCOUNTER — Inpatient Hospital Stay: Payer: Medicaid Other

## 2021-07-19 ENCOUNTER — Telehealth: Payer: Self-pay | Admitting: Oncology

## 2021-07-19 NOTE — Telephone Encounter (Signed)
Pt no showed for labs 1/30. Left Vm to reschedule labs prior to provider visit.

## 2021-07-22 ENCOUNTER — Inpatient Hospital Stay: Payer: Medicaid Other

## 2021-07-22 ENCOUNTER — Inpatient Hospital Stay: Payer: Medicaid Other | Attending: Nurse Practitioner | Admitting: Nurse Practitioner

## 2021-07-22 ENCOUNTER — Encounter: Payer: Self-pay | Admitting: Nurse Practitioner

## 2021-07-23 ENCOUNTER — Encounter: Payer: Self-pay | Admitting: Oncology

## 2021-08-04 ENCOUNTER — Encounter: Payer: Self-pay | Admitting: Oncology

## 2021-11-10 ENCOUNTER — Encounter: Payer: Self-pay | Admitting: Oncology

## 2022-01-21 ENCOUNTER — Encounter: Payer: Self-pay | Admitting: Oncology

## 2022-03-08 ENCOUNTER — Encounter: Payer: Self-pay | Admitting: Oncology

## 2022-03-15 ENCOUNTER — Ambulatory Visit: Payer: Medicaid Other | Admitting: Family Medicine

## 2022-03-15 ENCOUNTER — Encounter: Payer: Self-pay | Admitting: Family Medicine

## 2022-03-15 DIAGNOSIS — Z113 Encounter for screening for infections with a predominantly sexual mode of transmission: Secondary | ICD-10-CM

## 2022-03-15 LAB — WET PREP FOR TRICH, YEAST, CLUE
Trichomonas Exam: POSITIVE — AB
Yeast Exam: NEGATIVE

## 2022-03-15 MED ORDER — METRONIDAZOLE 500 MG PO TABS: 500.0000 mg | ORAL_TABLET | Freq: Two times a day (BID) | ORAL | 0 refills | Status: DC

## 2022-03-15 NOTE — Progress Notes (Signed)
[facility] Department  STI clinic/screening visit [place] [address] [id number]  Subjective:  BRAYLIN FORMBY is a 36 y.o. female being seen today for an STI screening visit. The patient reports they do have symptoms.  Patient reports that they do not desire a pregnancy in the next year.   They reported they are not interested in discussing contraception today.    No LMP recorded (lmp unknown). (Menstrual status: Irregular Periods).   Patient has the following medical conditions:   Patient Active Problem List   Diagnosis Date Noted   Elevated blood pressure affecting pregnancy in third trimester, antepartum 06/09/2021   History of cesarean section 91/47/8295   Drug use complicating pregnancy 62/13/0865   [redacted] weeks gestation of pregnancy    Iron deficiency anemia 05/19/2021   Supervision of high-risk pregnancy, third trimester 01/14/2021   Marijuana abuse 10/13/2015   Schizophrenia ([place]) 10/07/2015   Manic depressive disorder ([place]) 10/07/2015   Obesity in pregnancy, antepartum 10/07/2015   Tobacco user 10/07/2015   Substance use disorder 10/07/2015   Episodic mood disorder ([place]) 04/18/2007   Migraine 04/04/2007    Chief Complaint  Patient presents with   [place] discharge, odor, pain with sex, itching    HPI  Patient reports discharged for about 3-4 weeks associated with itching and odor. Has one partner, no new partners.   Last HIV test per patient/review of record was years ago Patient reports last pap was 02/25/2020-Negative, next in 2024  Screening for MPX risk: Does the patient have an unexplained rash? No Is the patient MSM? No Does the patient endorse multiple sex partners or anonymous sex partners? No Did the patient have close or sexual contact with a person diagnosed with MPX? No Has the patient traveled outside the [place] where MPX is endemic? No Is there a high clinical suspicion  for MPX-- evidenced by one of the following No  -Unlikely to be chickenpox  -Lymphadenopathy  -Rash that present in same phase of evolution on any given body part See flowsheet for further details and programmatic requirements.   Immunization history:  Immunization History  Administered Date(s) Administered   Tdap 05/04/2021     The following portions of the patient's history were reviewed and updated as appropriate: allergies, current medications, past medical history, past social history, past surgical history and problem list.  Objective:  There were no vitals filed for this visit.  Physical Exam Vitals and nursing note reviewed.  Constitutional:      Appearance: Normal appearance.  HENT:     Head: Normocephalic and atraumatic.     Mouth/Throat:     Mouth: Mucous membranes are moist.     Pharynx: Oropharynx is clear. No oropharyngeal exudate or posterior oropharyngeal erythema.  Pulmonary:     Effort: Pulmonary effort is normal.  Abdominal:     General: Abdomen is flat.     Palpations: There is no mass.     Tenderness: There is no abdominal tenderness. There is no rebound.  Genitourinary:    General: Normal vulva.     Exam position: Lithotomy position.     Pubic Area: No rash or pubic lice.      Labia:        Right: No rash or lesion.        Left: No rash or lesion.      Vagina: Vaginal discharge (creamy/gray white discharge. no odor.) present. No erythema, bleeding or lesions.  Cervix: No cervical motion tenderness, discharge, friability, lesion or erythema.     Uterus: Normal.      Adnexa: Right adnexa normal and left adnexa normal.     Rectum: Normal.     Comments: pH = >4,5  Lymphadenopathy:     Head:     Right side of head: No preauricular or posterior auricular adenopathy.     Left side of head: No preauricular or posterior auricular adenopathy.     Cervical: No cervical adenopathy.     Upper Body:     Right upper body: No supraclavicular, axillary or  epitrochlear adenopathy.     Left upper body: No supraclavicular, axillary or epitrochlear adenopathy.     Lower Body: No right inguinal adenopathy. No left inguinal adenopathy.  Skin:    General: Skin is warm and dry.     Findings: No rash.  Neurological:     Mental Status: She is alert and oriented to person, place, and time.      Assessment and Plan:  VEE BAHE is a 36 y.o. female presenting to the [facility] Department for STI screening  1. Screening examination for venereal disease Treat wet mount per standing (Ph>4.5, + discharge) Treat per standing for trich -- based the wet mount I reviewed with the patietn personally  Declines all blood work Programme researcher, broadcasting/film/video given expedited partner treatment -- sent rx to pharmacy. Patient accepted treatment at clinic today - WET PREP FOR TRICH, YEAST, CLUE - Chlamydia/Gonorrhea [facility]  No follow-ups on file.  No future appointments.  Federico Flake, MD

## 2022-03-15 NOTE — Progress Notes (Signed)
Pt visit for STI. Initial results came back positive for Trich. RN dispensed Metronidazole 14 pills and provided directions on how to take correctly and side effects. Contact card given, and expedited treatment for partner provided.

## 2022-03-15 NOTE — Patient Instructions (Addendum)
Expedited Partner Therapy:  Information Sheet for Patients and Partners               You have been offered expedited partner therapy (EPT). This information sheet contains important information and warnings you need to be aware of, so please read it carefully.   Expedited Partner Therapy (EPT) is the clinical practice of treating the sexual partners of persons who receive chlamydia, gonorrhea, or trichomoniasis diagnoses by providing medications or prescriptions to the patient. Patients then provide partners with these therapies without the health-care provider having examined the partner. In other words, EPT is a convenient, fast and private way for patients to help their sexual partners get treated.   Chlamydia and gonorrhea are bacterial infections you get from having sex with a person who is already infected. Trichomoniasis (or "trich") is a very common sexually transmitted infection (STI) that is caused by infection with a protozoan parasite called Trichomonas vaginalis.  Many people with these infections don't know it because they feel fine, but without treatment these infections can cause serious health problems, such as pelvic inflammatory disease, ectopic pregnancy, infertility and increased risk of HIV.   It is important to get treated as soon as possible to protect your health, to avoid spreading these infections to others, and to prevent yourself from becoming re-infected. The good news is these infections can be easily cured with proper antibiotic medicine. The best way to take care of your self is to see a doctor or go to your local health department. If you are not able to see a doctor or other medical provider, you should take EPT.    Recommended Medication: EPT for Chlamydia:  Azithromycin (Zithromax) 1 gram orally in a single dose EPT for Gonorrhea:  Cefixime (Suprax) 400 milligrams orally in a single dose PLUS azithromycin (Zithromax) 1 gram orally in a single dose EPT for  Trichomoniasis:  Metronidazole (Flagyl) 500 mg BID x 7 days   These medicines are very safe. However, you should not take them if you have ever had an allergic reaction (like a rash) to any of these medicines: azithromycin (Zithromax), erythromycin, clarithromycin (Biaxin), metronidazole (Flagyl), tinidazole (Tindimax). If you are uncertain about whether you have an allergy, call your medical provider or pharmacist before taking this medicine. If you have a serious, long-term illness like kidney, liver or heart disease, colitis or stomach problems, or you are currently taking other prescription medication, talk to your provider before taking this medication.   Women: If you have lower belly pain, pain during sex, vomiting, or a fever, do not take this medicine. Instead, you should see a medical provider to be certain you do not have pelvic inflammatory disease (PID). PID can be serious and lead to infertility, pregnancy problems or chronic pelvic pain.   Pregnant Women: It is very important for you to see a doctor to get pregnancy services and pre-natal care. These antibiotics for EPT are safe for pregnant women, but you still need to see a medical provider as soon as possible. It is also important to note that Doxycycline is an alternative therapy for chlamydia, but it should not be taken by someone who is pregnant.   Men: If you have pain or swelling in the testicles or a fever, do not take this medicine and see a medical provider.     Men who have sex with men (MSM): MSM in [place] continue to experience high rates of syphilis and HIV. Many MSM with gonorrhea or chlamydia  could also have syphilis and/or HIV and not know it. If you are a man who has sex with other men, it is very important that you see a medical provider and are tested for HIV and syphilis. EPT is not recommended for gonorrhea for MSM.  Recommended treatment for gonorrhea for MSM is Rocephin (shot) AND azithromycin due to  decreased cure rate.  Please see your medical provider if this is the case.    Along with this information sheet is a prescription for the medicine. If you receive a prescription it will be in your name and will indicate your date of birth, or it will be in the name of "Expedited Partner Therapy".   In either case, you can have the prescription filled at a pharmacy. You will be responsible for the cost of the medicine, unless you have prescription drug coverage. In that case, you could provide your name so the pharmacy could bill your health plan.   Take the medication as directed. Some people will have a mild, upset stomach, which does not last long. AVOID alcohol 24 hours after taking metronidazole (Flagyl) to reduce the possibility of a disulfiram-like reaction (severe vomiting and abdominal pain).  After taking the medicine, do not have sex for 7 days. Do not share this medicine or give it to anyone else. It is important to tell everyone you have had sex with in the last 60 days that they need to go and get tested for sexually transmitted infections.   Ways to prevent these and other sexually transmitted infections (STIs):    Abstain from sex. This is the only sure way to avoid getting an STI.   Use barrier methods, such as condoms, consistently and correctly.   Limit the number of sexual partners.   Have regular physical exams, including testing for STIs.   For more information about EPT or other issues pertaining to an STI, please contact your medical provider or the  [facility] Department at [phone] or [email or web address]   [facility] Department - main number [phone] Appointment MVHQ:[phone] or [email or web address]

## 2022-06-04 IMAGING — US US PELVIS COMPLETE WITH TRANSVAGINAL
1 series · 13 of 25 positions shown · non-contrast
Comparison: None

CLINICAL DATA: IUD threads lost, evaluate for placement



[Series 1: us pelvic complete with transvaginal · 119 acquisitions, 13 frames shown]
[im 1/119]
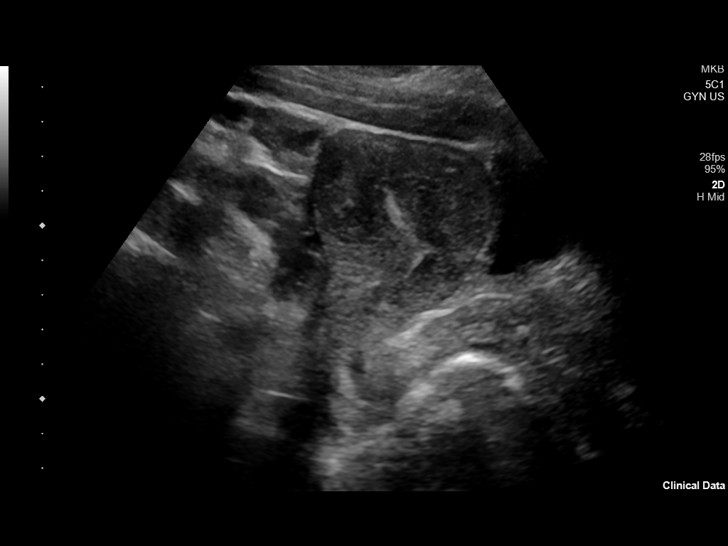
[im 10/119]
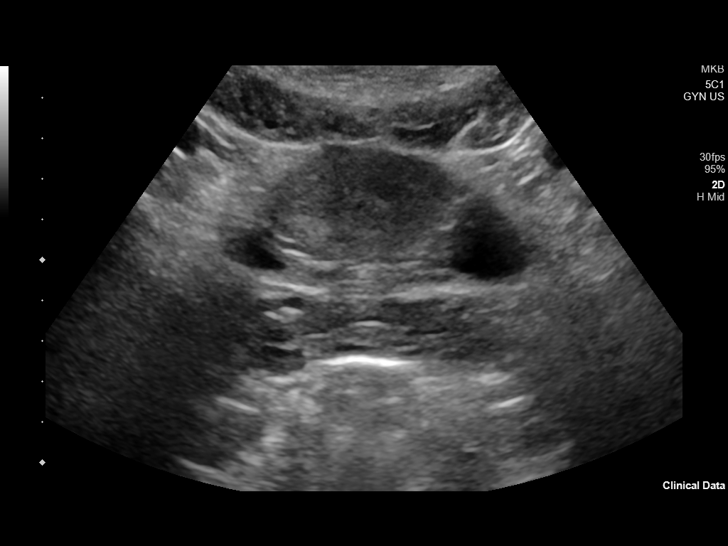
[im 20/119]
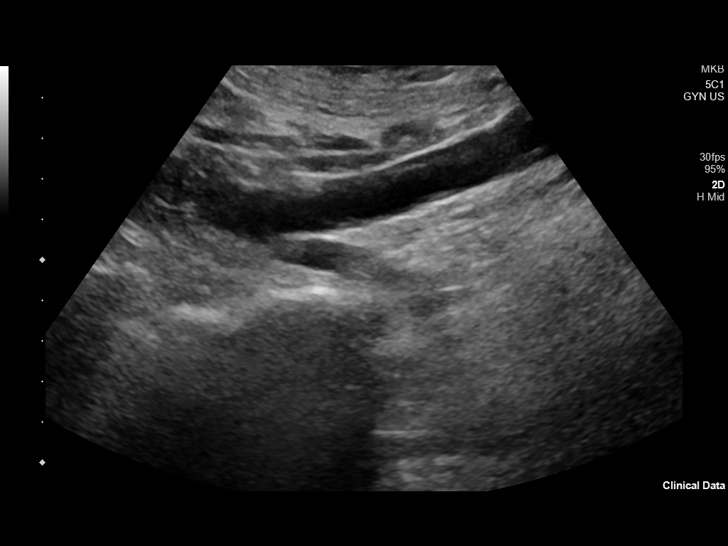
[im 30/119]
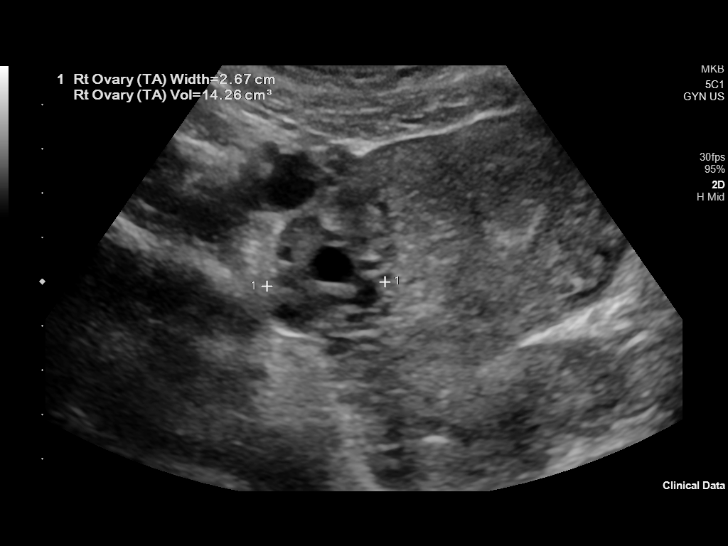
[im 40/119]
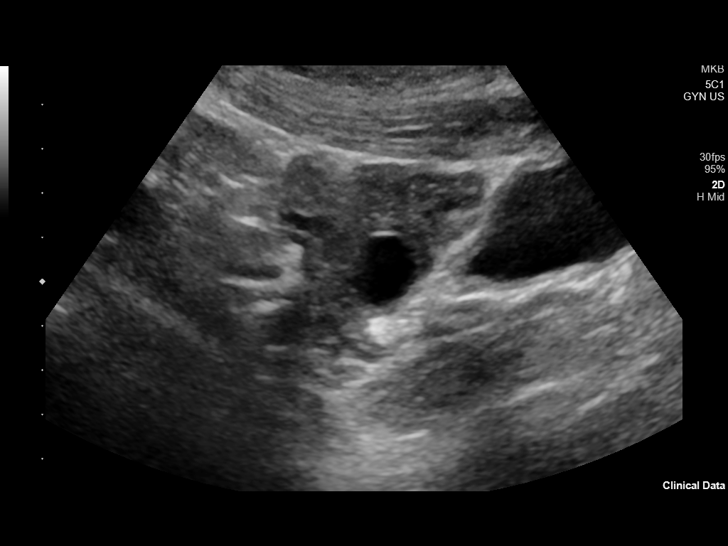
[im 50/119]
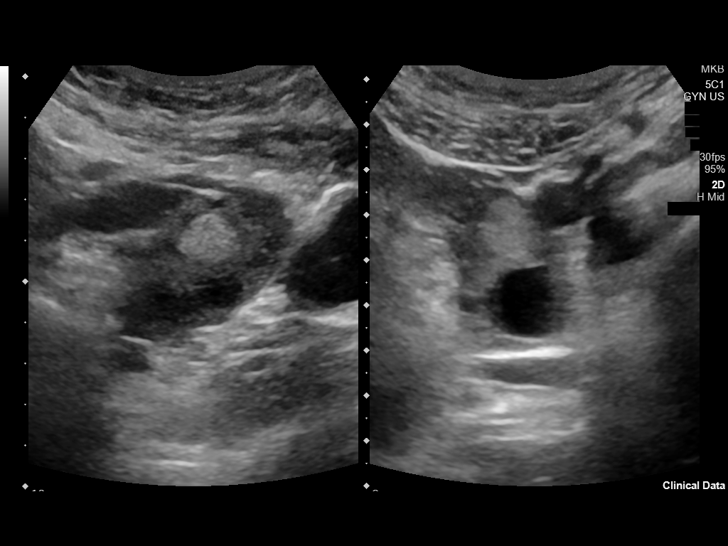
[im 60/119]
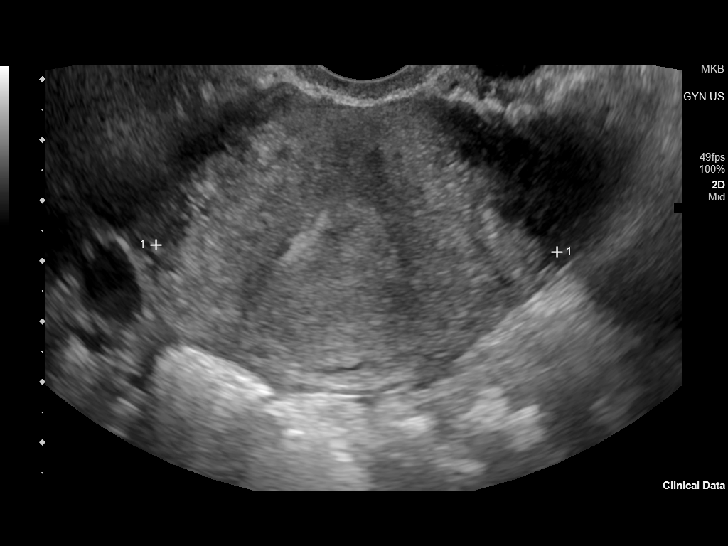
[im 69/119]
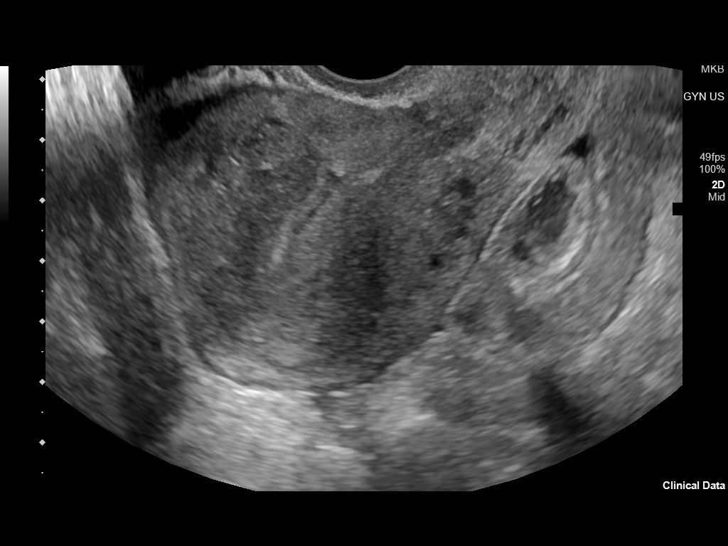
[im 79/119]
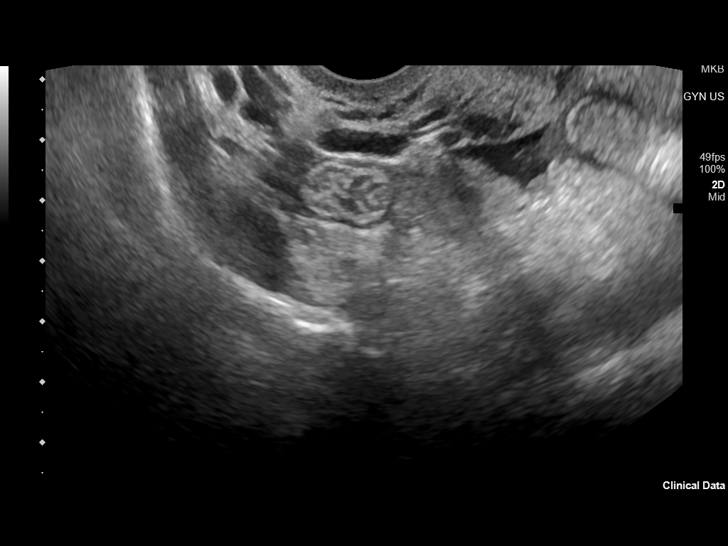
[im 89/119]
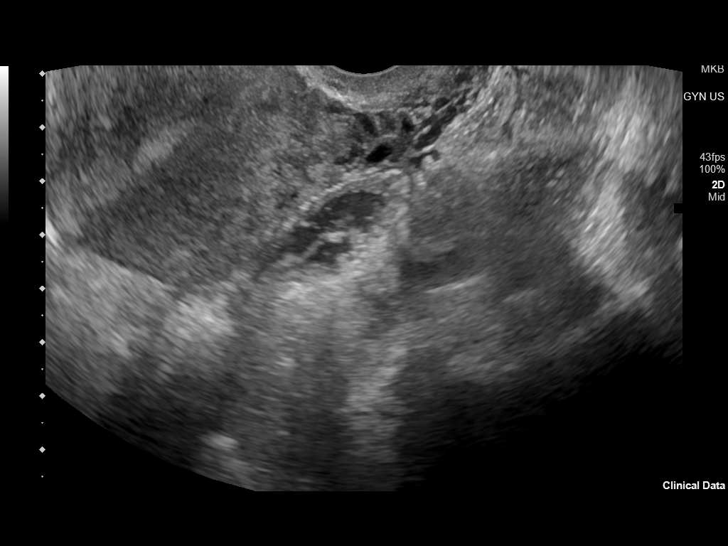
[im 99/119]
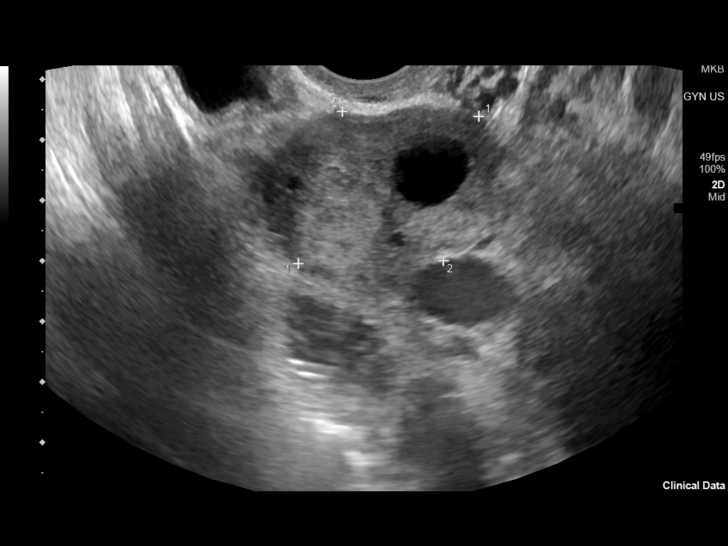
[im 109/119]
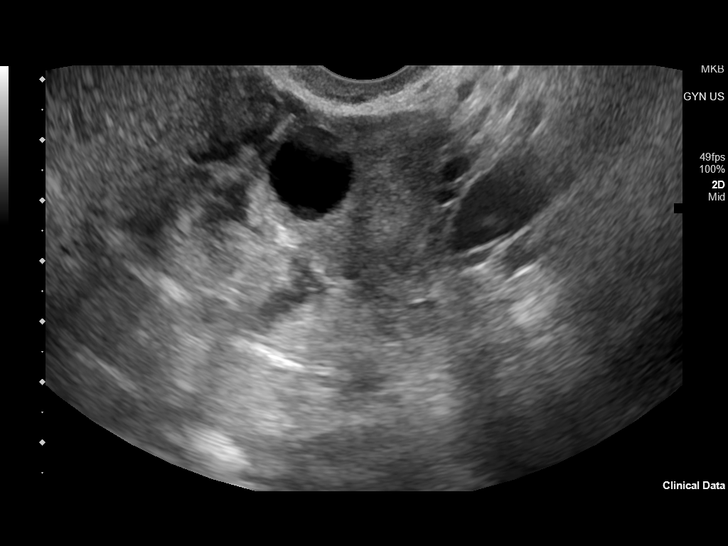
[im 119/119]
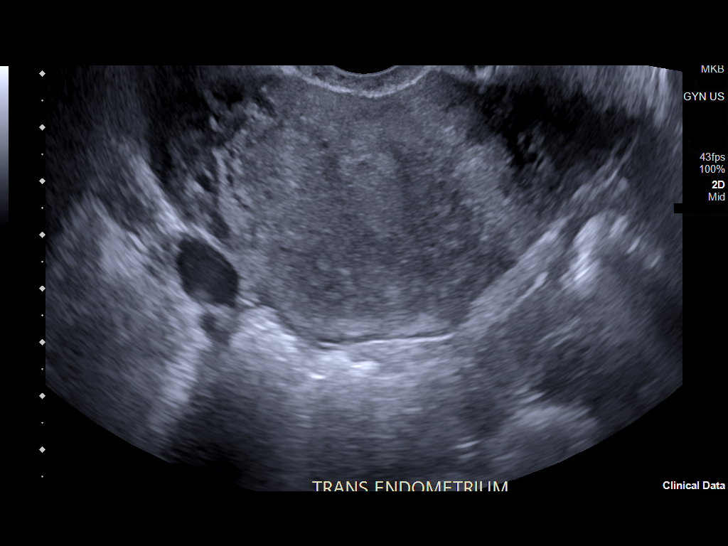

[13 of 25 positions shown; findings below may reference images not displayed]

FINDINGS: Uterus

Measurements: 8.4 x 5.2 x 6.6 cm. = volume: 150 mL. No fibroids or
other mass visualized.

Endometrium

Thickness: 5.9 mm. Minimal fluid is noted in the endometrial canal.
No IUD is identified.

Right ovary

Measurements: 4.1 x 2.5 x 2.7 cm. = volume: 14 mL. Normal
appearance/no adnexal mass.

Left ovary

Measurements: 3.9 x 3.0 x 3.4 cm. = volume: 20 mL. 2.0 cm echogenic
focus is noted within the left ovary. This is likely physiologic in
nature given the patient's age.

Other findings

Trace free fluid is noted.  This is likely physiologic in nature.
IMPRESSION: Minimal fluid is noted in the endometrial canal. No IUD is
identified. If clinically indicated plain film of the abdomen can be
performed to assess for an extrauterine location of the IUD.

## 2023-01-02 ENCOUNTER — Ambulatory Visit: Payer: Medicaid Other | Admitting: Advanced Practice Midwife

## 2023-01-02 ENCOUNTER — Encounter: Payer: Self-pay | Admitting: Advanced Practice Midwife

## 2023-01-02 DIAGNOSIS — T7422XS Child sexual abuse, confirmed, sequela: Secondary | ICD-10-CM

## 2023-01-02 DIAGNOSIS — Z9884 Bariatric surgery status: Secondary | ICD-10-CM

## 2023-01-02 DIAGNOSIS — R87619 Unspecified abnormal cytological findings in specimens from cervix uteri: Secondary | ICD-10-CM | POA: Insufficient documentation

## 2023-01-02 DIAGNOSIS — Z113 Encounter for screening for infections with a predominantly sexual mode of transmission: Secondary | ICD-10-CM

## 2023-01-02 DIAGNOSIS — A599 Trichomoniasis, unspecified: Secondary | ICD-10-CM

## 2023-01-02 DIAGNOSIS — R87610 Atypical squamous cells of undetermined significance on cytologic smear of cervix (ASC-US): Secondary | ICD-10-CM

## 2023-01-02 DIAGNOSIS — Z9851 Tubal ligation status: Secondary | ICD-10-CM

## 2023-01-02 DIAGNOSIS — T7422XA Child sexual abuse, confirmed, initial encounter: Secondary | ICD-10-CM | POA: Insufficient documentation

## 2023-01-02 LAB — WET PREP FOR TRICH, YEAST, CLUE
Trichomonas Exam: POSITIVE — AB
Yeast Exam: NEGATIVE

## 2023-01-02 MED ORDER — METRONIDAZOLE 500 MG PO TABS: 500.0000 mg | ORAL_TABLET | Freq: Two times a day (BID) | ORAL | Status: DC

## 2023-01-02 NOTE — Progress Notes (Signed)
[facility] Department  STI clinic/screening visit [address] [place] [address] [phone]  Subjective:  Michelle Kelley is a 36 y.o. DWF smoker G?P2 female being seen today for an STI screening visit. The patient reports they do have symptoms.  Patient reports that they do not desire a pregnancy in the next year.   They reported they are not interested in discussing contraception today.    Patient's last menstrual period was 12/17/2022 (within days).  Patient has the following medical conditions:   Patient Active Problem List   Diagnosis Date Noted   Trichomonas infection 03/15/22 01/02/2023   Abnormal Pap smear of cervix 01/21/21 ASCUS HPV+ 01/02/2023   History of tubal ligation 06/09/21 01/02/2023   Drug use complicating pregnancy 06/09/2021   Iron deficiency anemia 05/19/2021   Marijuana abuse 10/13/2015   Schizophrenia (HCC) 10/07/2015   Manic depressive disorder (HCC) 10/07/2015   Obesity in pregnancy, antepartum 10/07/2015   Tobacco user 10/07/2015   Substance use disorder 10/07/2015   Episodic mood disorder (HCC) 04/18/2007   Migraine 04/04/2007    Chief Complaint  Patient presents with   SEXUALLY TRANSMITTED DISEASE    HPI  Patient reports c/o increased d/c (pt does not know color), malodor, and "I have a ring of fire when I have sex" onset 02/2022. LMP 12/15/22. BTL 06/09/21. Last sex 12/28/22 without condom; with current partner x 5 years; 1 partner in last 3 mo. Last cig today. Last vaped today. Last MJ today. Last ETOH "years ago". Last pap 01/21/21 ASCUS HPV+.  Does the patient using douching products? No  Last HIV test per patient/review of record was No results found for: "HMHIVSCREEN"  Lab Results  Component Value Date   HIV NON REACTIVE 06/09/2021   Patient reports last pap was  Lab Results  Component Value Date   DIAGPAP (A) 01/21/2021    - Atypical squamous cells of undetermined significance (ASC-US)    Lab Results   Component Value Date   SPECADGYN Comment 02/25/2020    Screening for MPX risk: Does the patient have an unexplained rash? No Is the patient MSM? No Does the patient endorse multiple sex partners or anonymous sex partners? No Did the patient have close or sexual contact with a person diagnosed with MPX? No Has the patient traveled outside the [place] where MPX is endemic? No Is there a high clinical suspicion for MPX-- evidenced by one of the following No  -Unlikely to be chickenpox  -Lymphadenopathy  -Rash that present in same phase of evolution on any given body part See flowsheet for further details and programmatic requirements.   Immunization history:  Immunization History  Administered Date(s) Administered   Tdap 05/04/2021     The following portions of the patient's history were reviewed and updated as appropriate: allergies, current medications, past medical history, past social history, past surgical history and problem list.  Objective:  There were no vitals filed for this visit.  Physical Exam Vitals and nursing note reviewed.  Constitutional:      Appearance: Normal appearance. She is obese.  HENT:     Head: Normocephalic and atraumatic.     Mouth/Throat:     Mouth: Mucous membranes are moist.     Pharynx: Oropharynx is clear. No oropharyngeal exudate or posterior oropharyngeal erythema.  Eyes:     Conjunctiva/sclera: Conjunctivae normal.  Pulmonary:     Effort: Pulmonary effort is normal.  Abdominal:     Palpations: Abdomen is soft. There is  no mass.     Tenderness: There is no abdominal tenderness. There is no rebound.     Comments: Soft without masses or tenderness  Genitourinary:    General: Normal vulva.     Exam position: Lithotomy position.     Pubic Area: No rash or pubic lice.      Labia:        Right: No rash or lesion.        Left: No rash or lesion.      Vagina: Vaginal discharge (grey milky light red, ph<4.5) present. No erythema, bleeding or  lesions.     Cervix: No cervical motion tenderness, discharge, friability, lesion or erythema.     Uterus: Normal.      Rectum: Normal.     Comments: pH = <4.5 Lymphadenopathy:     Head:     Right side of head: No preauricular or posterior auricular adenopathy.     Left side of head: No preauricular or posterior auricular adenopathy.     Cervical: No cervical adenopathy.     Right cervical: No superficial, deep or posterior cervical adenopathy.    Left cervical: No superficial, deep or posterior cervical adenopathy.     Upper Body:     Right upper body: No supraclavicular, axillary or epitrochlear adenopathy.     Left upper body: No supraclavicular, axillary or epitrochlear adenopathy.     Lower Body: No right inguinal adenopathy. No left inguinal adenopathy.  Skin:    General: Skin is warm and dry.     Findings: No rash.  Neurological:     Mental Status: She is alert and oriented to person, place, and time.     Assessment and Plan:  Michelle Kelley is a 37 y.o. female presenting to the [facility] Department for STI screening  1. Screening examination for venereal disease Treat wet mount per standing orders Immunization nurse consult Please give pt contact card for trich Please give contact card for Kathreen Cosier, LCSW  - WET PREP FOR TRICH, YEAST, CLUE - Chlamydia/Gonorrhea [facility]  2. Trichomonas infection 03/15/22   3. Atypical squamous cells of undetermined significance on cytologic smear of cervix (ASC-US) No f/u  4. History of tubal ligation 06/09/21    Patient accepted all screenings including vaginal CT/GC and bloodwork for HIV/RPR, and wet prep. Patient meets criteria for HepB screening? Yes. Ordered? Pt refuses Patient meets criteria for HepC screening? Yes. Ordered? Pt refuses  Treat wet prep per standing order Discussed time line for [facility] results and that patient will be called with positive results and encouraged patient to call if  she had not heard in 2 weeks.  Counseled to return or seek care for continued or worsening symptoms Recommended repeat testing in 3 months with positive results. Recommended condom use with all sex  Patient is currently using [facility] to prevent pregnancy.    No follow-ups on file.  No future appointments.  Alberteen Spindle, CNM

## 2023-01-02 NOTE — Progress Notes (Signed)
Wet prep reviewed and treated for trich per standing order. Requesting medications for partner stating "he will not come here". Counseled that RN does not have a standing order that allows me to give her medicine to take to partner since a contact to trich. Counseled partner could go to an Urgent Care / walk-in clinic and she states "he won't go". Client requesting to speak to provider Hazle Coca CNM) and Ms.Sciora CNM notified. Client counseled that Ms. Sciora would speak with her as soon as finished with current client. Jossie Ng, RN

## 2023-08-23 IMAGING — US US MFM OB FOLLOW-UP
1 series · 13 of 28 positions shown · non-contrast
Comparison: none

[Series 1: us mfm ob follow-up · 13 of 51 slices shown]
[im 2/51]
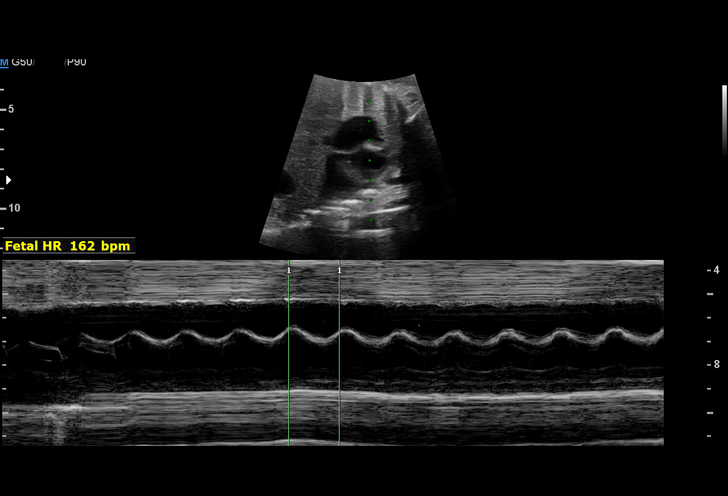
[im 6/51]
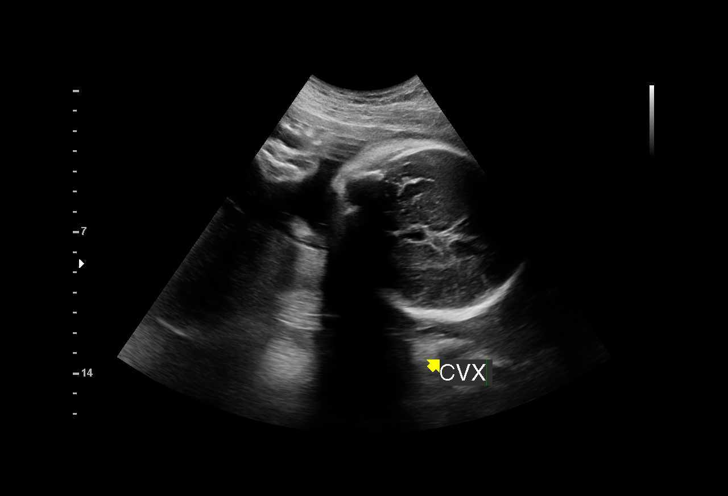
[im 10/51]
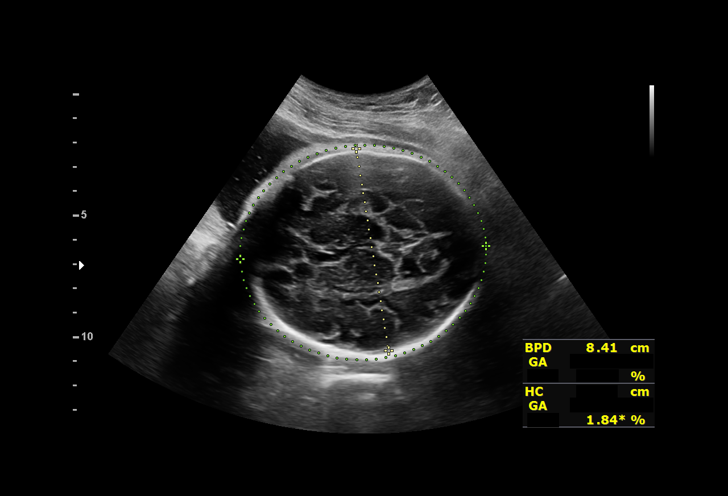
[im 13/51]
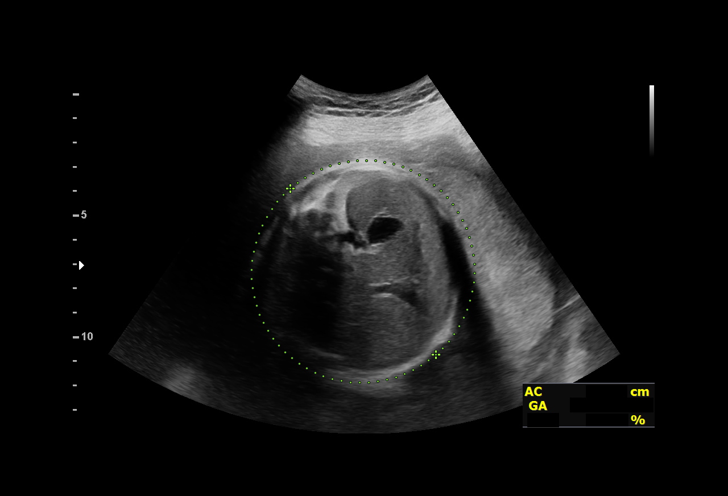
[im 17/51]
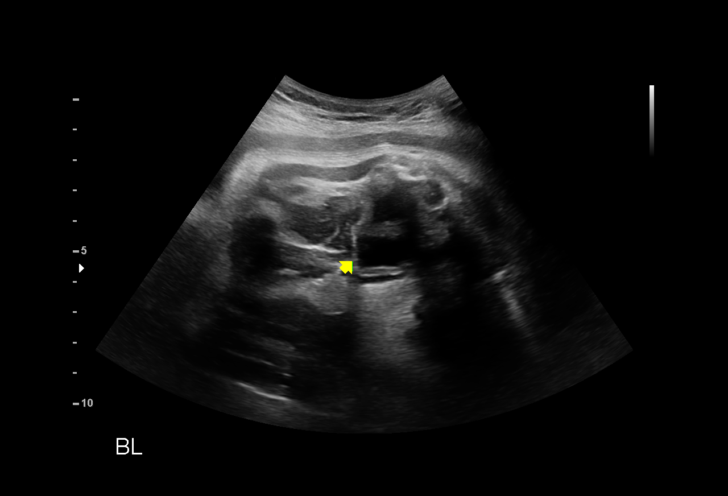
[im 21/51]
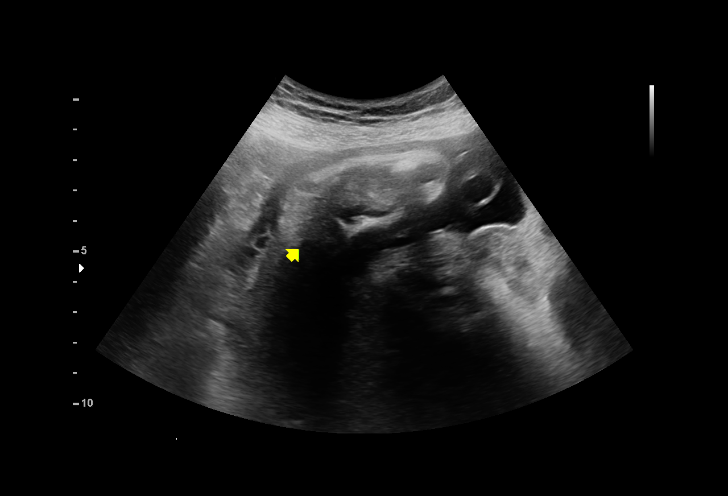
[im 26/51]
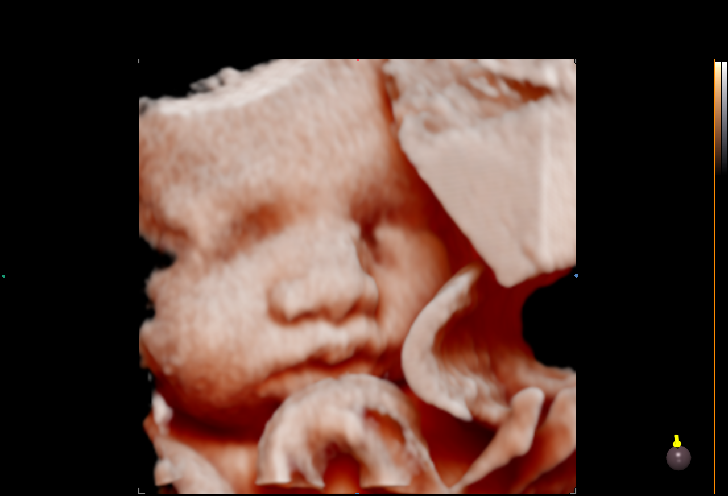
[im 30/51]
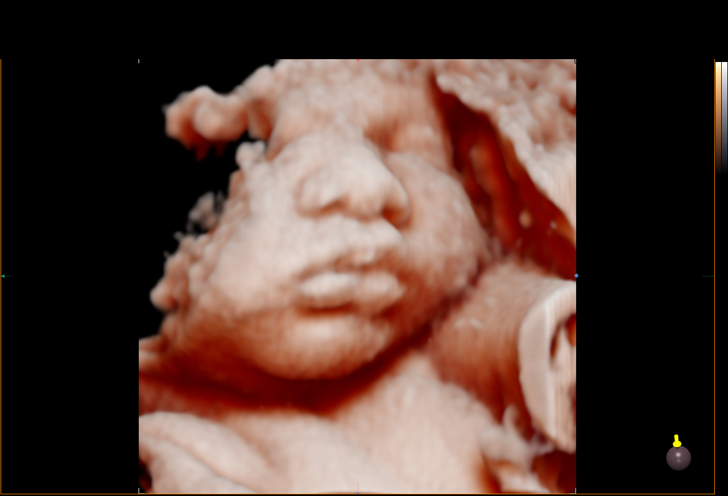
[im 34/51]
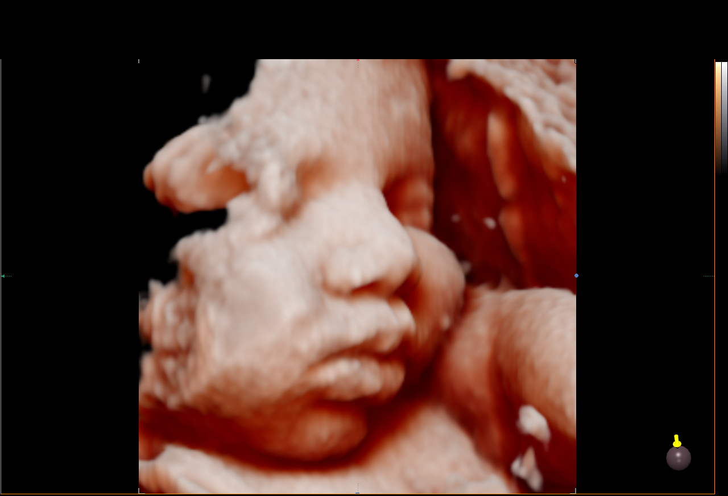
[im 38/51]
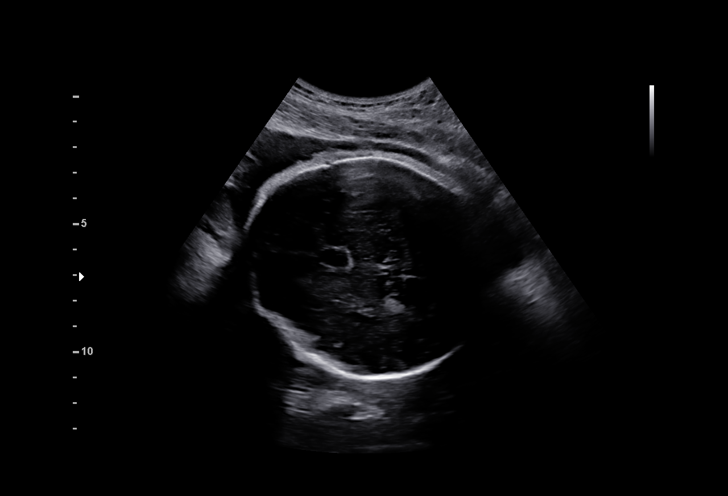
[im 41/51]
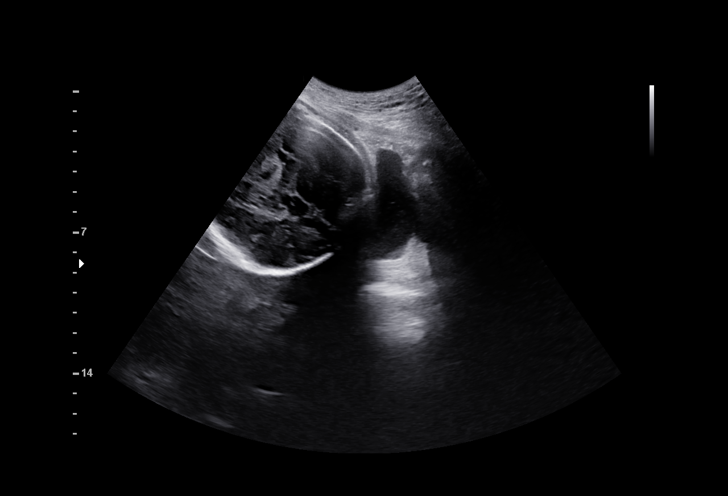
[im 45/51]
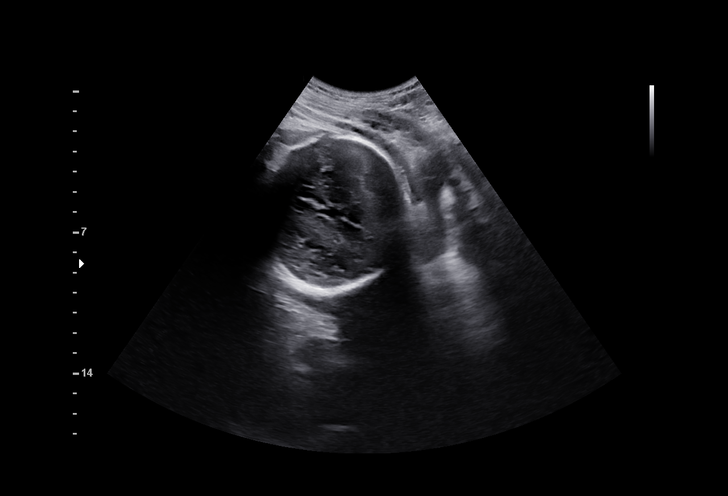
[im 49/51]
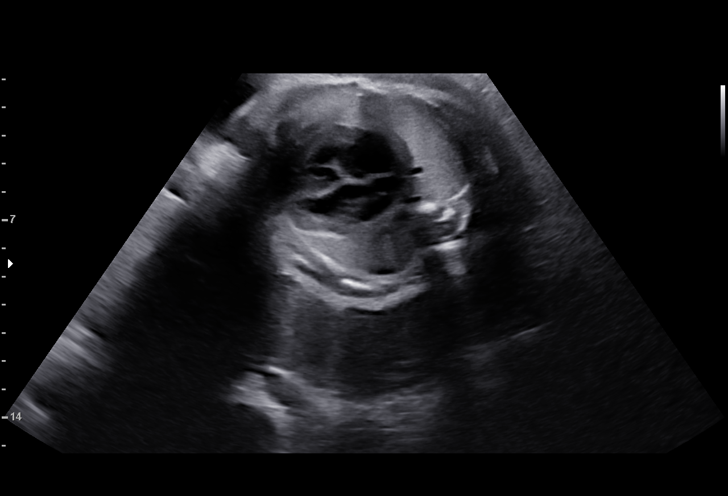

[13 of 28 positions shown; findings below may reference images not displayed]

Indications

 Pregnancy complicated by previous gastric
 bypass, antepartum, third trimester
 Tobacco use complicating pregnancy, third
 trimester
 Substance abuse affecting pregnancy,
 antepartum
 History of cesarean delivery, currently
 pregnant
 Poor obstetric history: Previous
 preeclampsia / eclampsia/gestational HTN
 34 weeks gestation of pregnancy
Fetal Evaluation

 Num Of Fetuses:         1
 Fetal Heart Rate(bpm):  162
 Cardiac Activity:       Observed
 Presentation:           Cephalic
 Placenta:               Posterior
 P. Cord Insertion:      Previously Visualized

 Amniotic Fluid
 AFI FV:      Within normal limits

 AFI Sum(cm)     %Tile       Largest Pocket(cm)
 17.4            64

 RUQ(cm)       RLQ(cm)       LUQ(cm)        LLQ(cm)
 3.5           5.1           4
Biometry

 BPD:      83.2  mm     G. Age:  33w 3d         20  %    CI:        77.03   %    70 - 86
                                                         FL/HC:      21.6   %    20.1 -
 HC:      300.2  mm     G. Age:  33w 2d        2.8  %    HC/AC:      1.02        0.93 -
 AC:      293.9  mm     G. Age:  33w 3d         22  %    FL/BPD:     78.0   %    71 - 87
 FL:       64.9  mm     G. Age:  33w 3d         16  %    FL/AC:      22.1   %    20 - 24
 LV:        3.6  mm

 Est. FW:    6811  gm    4 lb 13 oz      16  %
OB History

 Gravidity:    5         Term:   1
 Ectopic:      1
Gestational Age

 LMP:           34w 4d        Date:  09/18/20                 EDD:   06/25/21
 U/S Today:     33w 3d                                        EDD:   07/03/21
 Best:          34w 4d     Det. By:  LMP  (09/18/20)          EDD:   06/25/21
Anatomy

 Cranium:               Appears normal         Aortic Arch:            Previously seen
 Cavum:                 Previously seen        Ductal Arch:            Previously seen
 Ventricles:            Appears normal         Diaphragm:              Previously seen
 Choroid Plexus:        Previously seen        Stomach:                Appears normal, left
                                                                       sided
 Cerebellum:            Previously seen        Abdomen:                Previously seen
 Posterior Fossa:       Previously seen        Abdominal Wall:         Previously seen
 Nuchal Fold:           Not applicable (>20    Cord Vessels:           Previously seen
                        wks GA)
 Face:                  Orbits and profile     Kidneys:                Appear normal
                        previously seen
 Lips:                  Previously seen        Bladder:                Appears normal
 Thoracic:              Previously seen        Spine:                  Previously seen
 Heart:                 Appears normal         Upper Extremities:      Previously seen
                        (4CH, axis, and
                        situs)
 RVOT:                  Previously seen        Lower Extremities:      Previously seen
 LVOT:                  Previously seen

 Other:  Male gender previously seen. Hands not well visualized. Heels
         visualized previously. Technically difficult due to advanced GA and
         fetal position.
Cervix Uterus Adnexa

 Cervix
 Not visualized (advanced GA >65wks)

 Right Ovary
 Visualized.

 Left Ovary
 Visualized.
Comments

 This patient was seen for a follow up exam as a low-lying
 placenta and a small for gestational age fetus were noted
 during her prior exams.  She denies any problems since her
 last exam and reports feeling vigorous fetal movements
 throughout the day.
 She was informed that the fetal growth and amniotic fluid
 level appears appropriate for her gestational age.  The overall
 EFW obtained today measured at the 16th percentile for her
 gestational age.
 The patient declined to have a transvaginal ultrasound
 performed today.  Based on the abdominal scan, I believe
 that the placenta is no longer low-lying as the fetal head is
 presenting in the lower uterine segment past the posterior
 placenta.
 The patient reports that she already has a repeat cesarean
 delivery scheduled on June 17, 2021 (at 39 weeks).  As
 the low-lying placenta has resolved, she may proceed with
 delivery at 39 weeks.

## 2023-12-28 ENCOUNTER — Ambulatory Visit: Admitting: Physician Assistant

## 2023-12-28 VITALS — BP 171/96 | HR 85 | Resp 16 | Ht 62.0 in | Wt 168.0 lb

## 2023-12-28 DIAGNOSIS — F203 Undifferentiated schizophrenia: Secondary | ICD-10-CM

## 2023-12-28 DIAGNOSIS — Z7689 Persons encountering health services in other specified circumstances: Secondary | ICD-10-CM

## 2023-12-28 DIAGNOSIS — N920 Excessive and frequent menstruation with regular cycle: Secondary | ICD-10-CM

## 2023-12-28 DIAGNOSIS — I10 Essential (primary) hypertension: Secondary | ICD-10-CM

## 2023-12-28 DIAGNOSIS — R5383 Other fatigue: Secondary | ICD-10-CM

## 2023-12-28 DIAGNOSIS — D508 Other iron deficiency anemias: Secondary | ICD-10-CM | POA: Diagnosis not present

## 2023-12-28 DIAGNOSIS — F3170 Bipolar disorder, currently in remission, most recent episode unspecified: Secondary | ICD-10-CM

## 2023-12-28 DIAGNOSIS — Z720 Tobacco use: Secondary | ICD-10-CM

## 2023-12-28 DIAGNOSIS — E669 Obesity, unspecified: Secondary | ICD-10-CM | POA: Diagnosis not present

## 2023-12-28 DIAGNOSIS — J45909 Unspecified asthma, uncomplicated: Secondary | ICD-10-CM

## 2023-12-28 DIAGNOSIS — E282 Polycystic ovarian syndrome: Secondary | ICD-10-CM

## 2023-12-28 DIAGNOSIS — N8189 Other female genital prolapse: Secondary | ICD-10-CM

## 2023-12-28 DIAGNOSIS — R87610 Atypical squamous cells of undetermined significance on cytologic smear of cervix (ASC-US): Secondary | ICD-10-CM

## 2023-12-28 DIAGNOSIS — F17290 Nicotine dependence, other tobacco product, uncomplicated: Secondary | ICD-10-CM

## 2023-12-28 MED ORDER — AMLODIPINE BESYLATE 5 MG PO TABS: 5.0000 mg | ORAL_TABLET | Freq: Every day | ORAL | 1 refills | Status: DC

## 2023-12-28 MED ORDER — BLOOD PRESSURE KIT DEVI: 0 refills | Status: AC

## 2023-12-28 MED ORDER — LISINOPRIL 2.5 MG PO TABS: 2.5000 mg | ORAL_TABLET | Freq: Every day | ORAL | 1 refills | Status: DC

## 2023-12-28 NOTE — Progress Notes (Signed)
 New patient visit  Patient: Michelle Kelley   DOB: 1985-07-14   38 y.o. Female  MRN: [id number] Visit Date: 12/28/2023  Today's healthcare provider: Jolynn Spencer, PA-C   Chief Complaint  Patient presents with   New Patient (Initial Visit)    NP/Est Care . Thyroid concerns, Fatigue and Bp ( history of BP)   Subjective    Michelle Kelley is a 38 y.o. female who presents today as a new patient to establish care.   Discussed the use of AI scribe software for clinical note transcription with the patient, who gave verbal consent to proceed.  History of Present Illness Michelle Kelley is a 38 year old female with hypertension who presents with fatigue and concerns about blood pressure management.  She has not been on consistent antihypertensive medication and previously felt better on medication after an urgent care visit but discontinued it. She experiences significant fatigue and dizziness. She does not have a blood pressure cuff at home. She has severe anemia and has received transfusions in the past. Her menstrual periods are long and heavy, lasting about ten days, with five to seven days of heavy bleeding. She has been diagnosed with PCOS and endometriosis. She has a history of asthma and uses a nebulizer but lacks an inhaler. She quit smoking cigarettes in October and now vapes daily. She has a history of borderline personality disorder and schizophrenia and is open to resuming psychiatric treatment. She smokes marijuana and experiences nausea when lying on her left side.    Past Medical History:  Diagnosis Date   Anxiety    Asthma    Complication of anesthesia    I have a hard time coming out of general anesthesia, if I come out of it too fast, I get violent, I have to be eased out of it.   COPD (chronic obstructive pulmonary disease) (HCC)    HSV-2 (herpes simplex virus 2) infection    Hypertension    no meds   Iron  deficiency anemia 05/19/2021   Manic depression (HCC)     Menorrhagia    Migraine    Postoperative anemia due to acute blood loss 11/29/2015   Due to blood loss due to cesarean delivery    PTSD (post-traumatic stress disorder)    Schizophrenia, schizo-affective (HCC)    Vaginal Pap smear, abnormal    Past Surgical History:  Procedure Laterality Date   ABDOMINAL SURGERY     CARPAL TUNNEL RELEASE     CESAREAN SECTION     CESAREAN SECTION N/A 11/26/2015   Procedure: CESAREAN SECTION;  Surgeon: Mitzie BROCKS Ward, MD;  Location: [facility] ORS;  Service: Obstetrics;  Laterality: N/A;   CESAREAN SECTION WITH BILATERAL TUBAL LIGATION Bilateral 06/09/2021   Procedure: CESAREAN SECTION WITH BILATERAL TUBAL LIGATION;  Surgeon: Lake Read, MD;  Location: [facility] ORS;  Service: Obstetrics;  Laterality: Bilateral;   cosmetic repairs     left arm, right knee and back.   LAPAROSCOPIC GASTRIC SLEEVE RESECTION     PANNICULECTOMY     TONSILLECTOMY     Family Status  Relation Name Status   Mother  Alive   Sister  (Not Specified)  No partnership data on file   Family History  Problem Relation Age of Onset   Cancer Mother    Hypertension Mother    Heart disease Mother    Diabetes Mother    Lung disease Mother    Clotting disorder Mother    Bipolar disorder Mother  Mental illness Sister        SCHIZOPHRENIA   Social History   Socioeconomic History   Marital status: Media planner    Spouse name: Not on file   Number of children: Not on file   Years of education: Not on file   Highest education level: Not on file  Occupational History   Not on file  Tobacco Use   Smoking status: Every Day    Current packs/day: 0.50    Types: Cigarettes, E-cigarettes   Smokeless tobacco: Never  Vaping Use   Vaping status: Never Used  Substance and Sexual Activity   Alcohol use: Not Currently    Comment: last use years ago   Drug use: Yes    Types: Marijuana, Cocaine    Comment: last MJ today   Sexual activity: Yes    Partners: Male, Female    Birth  control/protection: Surgical  Other Topics Concern   Not on file  Social History Narrative   Not on file   Social Drivers of Health   Financial Resource Strain: Not on file  Food Insecurity: Not on file  Transportation Needs: Not on file  Physical Activity: Not on file  Stress: Not on file  Social Connections: Not on file   Outpatient Medications Prior to Visit  Medication Sig   acetaminophen  (TYLENOL ) 500 MG tablet Take 2 tablets (1,000 mg total) by mouth every 6 (six) hours as needed for fever or headache. (Patient not taking: Reported on 12/28/2023)   albuterol (VENTOLIN HFA) 108 (90 Base) MCG/ACT inhaler Inhale 2 puffs into the lungs every 6 (six) hours as needed for wheezing or shortness of breath. (Patient not taking: Reported on 12/28/2023)   amphetamine-dextroamphetamine (ADDERALL) 10 MG tablet Take 10 mg by mouth 2 (two) times daily with a meal. (Patient not taking: Reported on 12/28/2023)   folic acid  (FOLVITE ) 1 MG tablet Take 1 tablet (1 mg total) by mouth daily. (Patient not taking: Reported on 12/28/2023)   metroNIDAZOLE  (FLAGYL ) 500 MG tablet Take 1 tablet (500 mg total) by mouth 2 (two) times daily. (Patient not taking: Reported on 12/28/2023)   metroNIDAZOLE  (FLAGYL ) 500 MG tablet Take 1 tablet (500 mg total) by mouth 2 (two) times daily. (Patient not taking: Reported on 12/28/2023)   ondansetron  (ZOFRAN  ODT) 4 MG disintegrating tablet Take 1 tablet (4 mg total) by mouth every 6 (six) hours as needed for nausea. (Patient not taking: Reported on 12/28/2023)   oxyCODONE  (OXY IR/ROXICODONE ) 5 MG immediate release tablet Take 1-2 tablets (5-10 mg total) by mouth every 4 (four) hours as needed for moderate pain. (Patient not taking: Reported on 03/15/2022)   No facility-administered medications prior to visit.   Allergies  Allergen Reactions   Penicillins Shortness Of Breath   Petroleum Distillate Rash   Amitriptyline     Heavy sedation    Nsaids     No oral NSAIDS   Sulfa  Antibiotics     Pt can not recall reaction    Latex Rash    Immunization History  Administered Date(s) Administered   Tdap 05/04/2021    Health Maintenance  Topic Date Due   Pneumococcal Vaccine 56-101 Years old (1 of 2 - PCV) Never done   Hepatitis B Vaccines (1 of 3 - 19+ 3-dose series) Never done   HPV VACCINES (1 - 3-dose SCDM series) Never done   Cervical Cancer Screening (Pap smear)  01/21/2022   COVID-19 Vaccine (1 - 2024-25 season) Never done   INFLUENZA  VACCINE  01/19/2024   DTaP/Tdap/Td (2 - Td or Tdap) 05/05/2031   Hepatitis C Screening  Completed   HIV Screening  Completed   Meningococcal B Vaccine  Aged Out    Patient Care Team: Pcp, No as PCP - General  Review of Systems  All other systems reviewed and are negative.  Except see HPI       Objective    BP (!) 151/99 (BP Location: Left Arm, Patient Position: Sitting, Cuff Size: Normal)   Pulse 85   Resp 16   Ht 5' 2 (1.575 m)   Wt 168 lb (76.2 kg)   SpO2 98%   BMI 30.73 kg/m     Physical Exam Vitals reviewed.  Constitutional:      General: She is not in acute distress.    Appearance: Normal appearance. She is well-developed. She is not diaphoretic.  HENT:     Head: Normocephalic and atraumatic.  Eyes:     General: No scleral icterus.    Conjunctiva/sclera: Conjunctivae normal.  Neck:     Thyroid: No thyromegaly.  Cardiovascular:     Rate and Rhythm: Normal rate and regular rhythm.     Pulses: Normal pulses.     Heart sounds: Normal heart sounds. No murmur heard. Pulmonary:     Effort: Pulmonary effort is normal. No respiratory distress.     Breath sounds: Normal breath sounds. No wheezing, rhonchi or rales.  Musculoskeletal:     Cervical back: Neck supple.     Right lower leg: No edema.     Left lower leg: No edema.  Lymphadenopathy:     Cervical: No cervical adenopathy.  Skin:    General: Skin is warm and dry.     Findings: No rash.  Neurological:     Mental Status: She is alert  and oriented to person, place, and time. Mental status is at baseline.  Psychiatric:        Mood and Affect: Mood normal.        Behavior: Behavior normal.     Depression Screen    12/28/2023    2:52 PM 12/16/2020    2:48 PM 02/26/2020    2:10 PM 02/25/2020    3:30 PM  PHQ 2/9 Scores  PHQ - 2 Score 3 0 3 3  PHQ- 9 Score 19  8    No results found for any visits on 12/28/23.  Assessment & Plan      Assessment & Plan Heavy Menstrual Bleeding Heavy menstrual bleeding likely due to PCOS and endometriosis. Past abnormal Pap smear raises malignancy concern. - Refer to OBGYN for evaluation and management.  Anemia Severe anemia likely secondary to heavy menstrual bleeding. - Order CBC to assess anemia status.  Hypertension Hypertension possibly exacerbated by smoking and vaping. Symptoms of fatigue and dizziness may relate to uncontrolled blood pressure. - Measure blood pressure twice daily. - Obtain blood pressure cuff from pharmacy. - Start lisinopril  if hypertension persists.  Asthma Asthma management by albuterol inhaler  Advised to continue smoking/vaping cessation encouraged Will follow-up  Borderline Personality Disorder and Schizophrenia Lack of recent psychiatric care. Open to re-establishing care and medication. - Refer to psychiatry for evaluation and management. - Advise on urgent care options for psychiatric emergencies.  General Health Maintenance Smoking and vaping negatively impact health. Allergies not well-managed by Zyrtec. - Order labs: vitamin B12, vitamin D, iron  levels.  Encounter to establish care Welcomed to our clinic Reviewed past medical hx, social hx, family hx and  surgical hx Pt advised to send all vaccination records or screening  Primary hypertension (Primary) Chronic Previously diagnosed , not on medications BP today elevated. Needs to start taking BP medications Advised to measure BP at home daily - Lipid panel - Hemoglobin A1c -  lisinopril  (ZESTRIL ) 2.5 MG tablet; Take 1 tablet (2.5 mg total) by mouth daily.  Dispense: 30 tablet; Refill: 1 - amLODipine  (NORVASC ) 5 MG tablet; Take 1 tablet (5 mg total) by mouth daily.  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 ordered - Lipid panel - Hemoglobin A1c - TSH - T4, free - CBC with Differential/Platelet - Comprehensive metabolic panel with GFR Will reassess after  receiving lab results  Other fatigue Chronic and stable Advised healthy diet and regular exercise Initial workup - Lipid panel - Hemoglobin A1c - TSH - T4, free - CBC with Differential/Platelet - Comprehensive metabolic panel with GFR - Vitamin B12 - VITAMIN D 25 Hydroxy (Vit-D Deficiency, Fractures) Will reassess after  receiving lab results  Other iron  deficiency anemia Could be due to menorrhagia - CBC with Differential/Platelet Will follow-up  PCOS (polycystic ovarian syndrome) Menorrhagia with regular cycle With abnormal pap in the past Other female genital prolapse - TSH - CBC with Differential/Platelet - Comprehensive metabolic panel with GFR - Ambulatory referral to Gynecology Will follow-up  Vaping nicotine  dependence, tobacco product Smokes in the past, up to 2ppd Marijuana abuse Patient was advised to quit smoking  Will reassess at the next appt   No follow-ups on file.    The patient was advised to call  back or seek an in-person evaluation if the symptoms worsen or if the condition fails to improve as anticipated.  I discussed the assessment and treatment plan with the patient. The patient was provided an opportunity to ask questions and all were answered. The patient agreed with the plan and demonstrated an understanding of the instructions.  I, Bereket Gernert, PA-C have reviewed all documentation for this visit. The documentation on  12/28/2023   for the exam, diagnosis, procedures, and orders are all accurate and complete.  Jolynn Spencer, [facility], MMS [facility] [phone] (phone) [phone] (fax)  [facility]

## 2023-12-29 ENCOUNTER — Telehealth: Payer: Self-pay

## 2023-12-29 NOTE — Progress Notes (Signed)
 Care Guide Pharmacy Note  12/29/2023 Name: Michelle Kelley MRN: [id number] DOB: April 16, 1986  Referred By: Pcp, No Reason for referral: Complex Care Management (Outreach to schedule with Pharm d )   Michelle Kelley is a 38 y.o. year old female who is a primary care patient of Pcp, No.  Michelle Kelley was referred to the pharmacist for assistance related to: HTN  An unsuccessful telephone outreach was attempted today to contact the patient who was referred to the pharmacy team for assistance with medication management. Additional attempts will be made to contact the patient.  Jeoffrey Buffalo , RMA     [facility]  [facility], [facility] Guide  Direct Dial: [phone]  Website: [email or web address]

## 2023-12-29 NOTE — Progress Notes (Signed)
 Care Guide Pharmacy Note  12/29/2023 Name: Michelle Kelley MRN: [id number] DOB: [birth year]  Referred By: Pcp, No Reason for referral: Complex Care Management (Outreach to schedule with Pharm d )   Michelle Kelley is a 38 y.o. year old female who is a primary care patient of Pcp, No.  Michelle Kelley was referred to the pharmacist for assistance related to: HTN  Successful contact was made with the patient to discuss pharmacy services including being ready for the pharmacist to call at least 5 minutes before the scheduled appointment time and to have medication bottles and any blood pressure readings ready for review. The patient agreed to meet with the pharmacist via telephone visit on (date/time).01/04/2024  Jeoffrey Buffalo , RMA     [place]  [facility], [facility] Guide  Direct Dial: [phone]  Website: [email or web address]

## 2023-12-30 ENCOUNTER — Encounter: Payer: Self-pay | Admitting: Physician Assistant

## 2023-12-31 ENCOUNTER — Other Ambulatory Visit: Payer: Self-pay | Admitting: Medical Genetics

## 2024-01-03 ENCOUNTER — Encounter: Payer: Self-pay | Admitting: Physician Assistant

## 2024-01-04 ENCOUNTER — Other Ambulatory Visit: Payer: Self-pay

## 2024-01-04 ENCOUNTER — Encounter: Payer: Self-pay | Admitting: Physician Assistant

## 2024-01-04 NOTE — Progress Notes (Signed)
 01/04/2024 Name: Michelle Kelley MRN: [id number] DOB: April 08, 1986  Chief Complaint  Patient presents with   Hypertension    Blood pressure device    Michelle Kelley is a 38 y.o. year old female who presented for a telephone visit.   They were referred to the pharmacist by their PCP for assistance in managing hypertension.    Subjective:  Care Team: Primary Care Provider: Pcp, No ; Next Scheduled Visit: 02/16/2024  Medication Access/Adherence  Current Pharmacy:  [facility] [address] [place] - [address] [place] [address] Phone: [phone] Fax: [phone]   Patient reports affordability concerns with their medications: States that she does not think she will always have the money to pay for medication even if the copay is $4 for each medication. She is worried about the cost of additional prescription medications. She is on food stamps for groceries and her bills are generally okay and may be a little late making payments. Not interested in additional assistance for financial restraints.   Patient reports access/transportation concerns to their pharmacy: No   Patient reports adherence concerns with their medications:  Yes  She uses an alarm reminder on her phone and states that she sometimes forgets to take her medications. Once she accidentally took two doses for both BP medications because she was not sure if she had already took the medication that day. She states that she has a nonverbal autistic two-year old and doesn't feel safe with leaving a pillbox around the house for easy access. Discussed using a pill reminder app to track medication administration.    Hypertension:  Current medications: amlodipine  5 mg daily, lisinopril  2.5 mg daily   Patient has a validated, automated, upper arm home BP cuff - she reports using her grandmothers blood pressure and declines getting a new device as she feels it is working well Current blood  pressure readings readings: Patient stated that on 12/31/2023 was the best her BP has been as she accidentally took a double dose of both medications.   - Patient reported blood pressure readings using an App in her smartphone    Patient denies hypotensive s/sx including dizziness, lightheadedness.  Patient reports hypertensive symptoms including headache and checked her blood pressure which was okay at the time.     Current physical activity: not gym exercise; enjoys running in the woods (playing), swimming, climbing trees   Objective:  No results found for: HGBA1C  Lab Results  Component Value Date   CREATININE 0.55 06/09/2021   BUN 8 06/09/2021   NA 138 06/09/2021   K 3.2 (L) 06/09/2021   CL 108 06/09/2021   CO2 22 06/09/2021    No results found for: CHOL, HDL, LDLCALC, LDLDIRECT, TRIG, CHOLHDL  Medications Reviewed Today     Reviewed by Cleatus Dorcas SAUNDERS, [facility] (Pharmacist) on 01/04/24 at [phone]  Med List Status: <None>   Medication Order Taking? Sig Documenting Provider Last Dose Status Informant  acetaminophen  (TYLENOL ) 500 MG tablet [id number] Yes Take 2 tablets (1,000 mg total) by mouth every 6 (six) hours as needed for fever or headache. Schuman, Christanna R, MD  Active   albuterol (VENTOLIN HFA) 108 (90 Base) MCG/ACT inhaler [id number] Yes Inhale 2 puffs into the lungs every 6 (six) hours as needed for wheezing or shortness of breath. [provider]  Active Self  amLODipine  (NORVASC ) 5 MG tablet [id number] Yes Take 1 tablet (5 mg total) by mouth daily. Ostwalt, Janna, PA-C  Active  amphetamine-dextroamphetamine (ADDERALL) 10 MG tablet [id number]  Take 10 mg by mouth 2 (two) times daily with a meal.  Patient not taking: Reported on 01/04/2024   [provider]  Active   Blood Pressure Monitoring (BLOOD PRESSURE KIT) DEVI [id number] Yes Any brand available, measure BP daily Ostwalt, Janna, PA-C  Active    Patient not taking:   Discontinued  01/04/24 0940 (Completed Course)          Med Note>> Cleatus Dorcas SAUNDERS, [facility]   01/04/2024  9:40 AM took during pregnancy    lisinopril  (ZESTRIL ) 2.5 MG tablet [id number] Yes Take 1 tablet (2.5 mg total) by mouth daily. Ostwalt, Janna, PA-C  Active    Patient not taking:   Discontinued 01/04/24 0941 (Completed Course)    Patient not taking:   Discontinued 01/04/24 0941 (Completed Course)   ondansetron  (ZOFRAN  ODT) 4 MG disintegrating tablet [id number] Yes Take 1 tablet (4 mg total) by mouth every 6 (six) hours as needed for nausea. Arloa Lamar SQUIBB, MD  Active Self   Patient not taking:   Discontinued 01/04/24 0942 (Completed Course)               Assessment/Plan:   Hypertension: - Currently uncontrolled but patient recently started lisinopril  and amlodipine . Patient was reminded it may take about four few weeks for the full therapeutic effect of her BP medications. The purpose of this referral was to help the patient obtain a blood pressure device and she already has one that she is content using. - Reviewed long term cardiovascular and renal outcomes of uncontrolled blood pressure - Reviewed appropriate blood pressure monitoring technique and reviewed goal blood pressure. Recommended to check home blood pressure and heart rate twice daily as recommended by PCP.  - Recommend to continue current regimen. Future consideration for dose adjustment on blood pressure medication (s) if it remains elevated.   - Encouraged patient to use a pill reminder application to track/document administration of medication.  - Reviewed how to properly check blood pressure.     Follow Up Plan: None needed at this time   Dorcas Cleatus, PharmD Clinical Pharmacist Cell: [phone]

## 2024-01-05 ENCOUNTER — Other Ambulatory Visit
Admission: RE | Admit: 2024-01-05 | Discharge: 2024-01-05 | Disposition: A | Discharge status: Home / Self Care | Attending: Physician Assistant | Admitting: Physician Assistant

## 2024-01-05 ENCOUNTER — Other Ambulatory Visit
Admission: RE | Admit: 2024-01-05 | Discharge: 2024-01-05 | Disposition: A | Discharge status: Home / Self Care | Payer: Self-pay | Source: Ambulatory Visit | Attending: Medical Genetics | Admitting: Medical Genetics

## 2024-01-05 DIAGNOSIS — D508 Other iron deficiency anemias: Secondary | ICD-10-CM | POA: Insufficient documentation

## 2024-01-05 DIAGNOSIS — N920 Excessive and frequent menstruation with regular cycle: Secondary | ICD-10-CM | POA: Insufficient documentation

## 2024-01-05 DIAGNOSIS — I10 Essential (primary) hypertension: Secondary | ICD-10-CM | POA: Insufficient documentation

## 2024-01-05 DIAGNOSIS — E669 Obesity, unspecified: Secondary | ICD-10-CM | POA: Diagnosis present

## 2024-01-05 DIAGNOSIS — R5383 Other fatigue: Secondary | ICD-10-CM | POA: Insufficient documentation

## 2024-01-05 LAB — CBC WITH DIFFERENTIAL/PLATELET
Abs Immature Granulocytes: 0.05 K/uL (ref 0.00–0.07)
Basophils Absolute: 0.1 K/uL (ref 0.0–0.1)
Basophils Relative: 1 %
Eosinophils Absolute: 0.1 K/uL (ref 0.0–0.5)
Eosinophils Relative: 1 %
HCT: 42 % (ref 36.0–46.0)
Hemoglobin: 14.5 g/dL (ref 12.0–15.0)
Immature Granulocytes: 0 %
Lymphocytes Relative: 29 %
Lymphs Abs: 3.6 K/uL (ref 0.7–4.0)
MCH: 29.5 pg (ref 26.0–34.0)
MCHC: 34.5 g/dL (ref 30.0–36.0)
MCV: 85.5 fL (ref 80.0–100.0)
Monocytes Absolute: 0.7 K/uL (ref 0.1–1.0)
Monocytes Relative: 6 %
Neutro Abs: 7.9 K/uL — ABNORMAL HIGH (ref 1.7–7.7)
Neutrophils Relative %: 63 %
Platelets: 382 K/uL (ref 150–400)
RBC: 4.91 MIL/uL (ref 3.87–5.11)
RDW: 13.2 % (ref 11.5–15.5)
WBC: 12.5 K/uL — ABNORMAL HIGH (ref 4.0–10.5)
nRBC: 0 % (ref 0.0–0.2)

## 2024-01-05 LAB — COMPREHENSIVE METABOLIC PANEL WITH GFR
ALT: 42 U/L (ref 0–44)
AST: 34 U/L (ref 15–41)
Albumin: 4.6 g/dL (ref 3.5–5.0)
Alkaline Phosphatase: 105 U/L (ref 38–126)
Anion gap: 13 (ref 5–15)
BUN: 13 mg/dL (ref 6–20)
CO2: 23 mmol/L (ref 22–32)
Calcium: 10.2 mg/dL (ref 8.9–10.3)
Chloride: 101 mmol/L (ref 98–111)
Creatinine, Ser: 0.63 mg/dL (ref 0.44–1.00)
GFR, Estimated: >60 mL/min (ref >60)
Glucose, Bld: 95 mg/dL (ref 70–99)
Potassium: 3.8 mmol/L (ref 3.5–5.1)
Sodium: 137 mmol/L (ref 135–145)
Total Bilirubin: 0.6 mg/dL (ref 0.0–1.2)
Total Protein: 8.2 g/dL — ABNORMAL HIGH (ref 6.5–8.1)

## 2024-01-05 LAB — T4, FREE: Free T4: 0.68 ng/dL (ref 0.61–1.12)

## 2024-01-05 LAB — VITAMIN B12: Vitamin B-12: 1018 pg/mL — ABNORMAL HIGH (ref 180–914)

## 2024-01-05 LAB — HEMOGLOBIN A1C
Hgb A1c MFr Bld: 5 % (ref 4.8–5.6)
Mean Plasma Glucose: 96.8 mg/dL

## 2024-01-05 LAB — LIPID PANEL
Cholesterol: 291 mg/dL — ABNORMAL HIGH (ref 0–200)
HDL: 75 mg/dL (ref >40)
LDL Cholesterol: 174 mg/dL — ABNORMAL HIGH (ref 0–99)
Total CHOL/HDL Ratio: 3.9 ratio
Triglycerides: 209 mg/dL — ABNORMAL HIGH (ref <150)
VLDL: 42 mg/dL — ABNORMAL HIGH (ref 0–40)

## 2024-01-05 LAB — TSH: TSH: 0.698 u[IU]/mL (ref 0.350–4.500)

## 2024-01-05 LAB — VITAMIN D 25 HYDROXY (VIT D DEFICIENCY, FRACTURES): Vit D, 25-Hydroxy: 27.48 ng/mL — ABNORMAL LOW (ref 30–100)

## 2024-01-09 ENCOUNTER — Other Ambulatory Visit: Payer: Self-pay | Admitting: Physician Assistant

## 2024-01-09 ENCOUNTER — Ambulatory Visit: Payer: Self-pay | Admitting: Physician Assistant

## 2024-01-09 DIAGNOSIS — J3089 Other allergic rhinitis: Secondary | ICD-10-CM

## 2024-01-09 MED ORDER — ATORVASTATIN CALCIUM 10 MG PO TABS: 10.0000 mg | ORAL_TABLET | Freq: Every day | ORAL | 0 refills | Status: DC

## 2024-01-09 NOTE — Progress Notes (Signed)
 Please, order atorvastatin  10mg . Take 1 tab by mouth at bedtime, #90, 0 refills.

## 2024-01-10 MED ORDER — LEVOCETIRIZINE DIHYDROCHLORIDE 5 MG PO TABS: 5.0000 mg | ORAL_TABLET | Freq: Every evening | ORAL | 2 refills | Status: DC

## 2024-01-16 LAB — GENECONNECT MOLECULAR SCREEN: Genetic Analysis Overall Interpretation: NEGATIVE

## 2024-02-15 ENCOUNTER — Encounter: Payer: Self-pay | Admitting: Obstetrics

## 2024-02-15 ENCOUNTER — Other Ambulatory Visit (HOSPITAL_COMMUNITY)
Admission: RE | Admit: 2024-02-15 | Discharge: 2024-02-15 | Disposition: A | Discharge status: Home / Self Care | Source: Ambulatory Visit | Attending: Obstetrics | Admitting: Obstetrics

## 2024-02-15 ENCOUNTER — Encounter: Payer: Self-pay | Admitting: Oncology

## 2024-02-15 ENCOUNTER — Ambulatory Visit: Admitting: Obstetrics

## 2024-02-15 VITALS — BP 127/87 | HR 67 | Ht 62.0 in | Wt 166.0 lb

## 2024-02-15 DIAGNOSIS — E559 Vitamin D deficiency, unspecified: Secondary | ICD-10-CM

## 2024-02-15 DIAGNOSIS — Z124 Encounter for screening for malignant neoplasm of cervix: Secondary | ICD-10-CM | POA: Diagnosis present

## 2024-02-15 DIAGNOSIS — N926 Irregular menstruation, unspecified: Secondary | ICD-10-CM | POA: Diagnosis not present

## 2024-02-15 DIAGNOSIS — N39498 Other specified urinary incontinence: Secondary | ICD-10-CM

## 2024-02-15 DIAGNOSIS — R32 Unspecified urinary incontinence: Secondary | ICD-10-CM | POA: Diagnosis not present

## 2024-02-15 LAB — POCT URINALYSIS DIPSTICK
Bilirubin, UA: NEGATIVE
Glucose, UA: NEGATIVE
Ketones, UA: NEGATIVE
Leukocytes, UA: NEGATIVE
Nitrite, UA: NEGATIVE
Protein, UA: NEGATIVE
Spec Grav, UA: 1.01 (ref 1.010–1.025)
Urobilinogen, UA: 0.2 U/dL
pH, UA: 6 (ref 5.0–8.0)

## 2024-02-15 MED ORDER — NORETHINDRONE 0.35 MG PO TABS: 1.0000 | ORAL_TABLET | Freq: Every day | ORAL | 3 refills | Status: DC

## 2024-02-15 MED ORDER — VITAMIN D (ERGOCALCIFEROL) 1.25 MG (50000 UNIT) PO CAPS
50000.0000 [IU] | ORAL_CAPSULE | ORAL | 0 refills | Status: DC
Start: 2024-02-15 — End: 2024-04-29

## 2024-02-15 NOTE — Progress Notes (Signed)
 GYN ENCOUNTER  Subjective  HPI: Michelle Kelley is a 38 y.o. [id number] who presents today to follow up on an abnormal Pap smear and to discuss abnormal menstrual bleeding. Her Pap in 2022 was ASCUS with +HPV. Prior Pap smear was NILM with +HPV. Colposcopy was recommended at that time. She reports that she has generally had regular periods until 6-7 months ago. Since then, her periods have become irregular by 4-7 days. They last 10 days and then she has spotting for another 6-7 days. She also has postcoital bleeding. She is not on any hormonal contraception. She also reports fatigue. She has chronic urinary incontinence since age 43. She saw a urologist who recommended surgery at one point, but she did not return. She is interested in going back to urology.  Past Medical History:  Diagnosis Date   Anxiety    Asthma    Complication of anesthesia    I have a hard time coming out of general anesthesia, if I come out of it too fast, I get violent, I have to be eased out of it.   COPD (chronic obstructive pulmonary disease) (HCC)    HSV-2 (herpes simplex virus 2) infection    Hypertension    no meds   Iron  deficiency anemia 05/19/2021   Manic depression (HCC)    Menorrhagia    Migraine    Postoperative anemia due to acute blood loss 11/29/2015   Due to blood loss due to cesarean delivery    PTSD (post-traumatic stress disorder)    Schizophrenia, schizo-affective (HCC)    Vaginal Pap smear, abnormal    Past Surgical History:  Procedure Laterality Date   ABDOMINAL SURGERY     CARPAL TUNNEL RELEASE     CESAREAN SECTION     CESAREAN SECTION N/A 11/26/2015   Procedure: CESAREAN SECTION;  Surgeon: Mitzie BROCKS Ward, MD;  Location: [facility] ORS;  Service: Obstetrics;  Laterality: N/A;   CESAREAN SECTION WITH BILATERAL TUBAL LIGATION Bilateral 06/09/2021   Procedure: CESAREAN SECTION WITH BILATERAL TUBAL LIGATION;  Surgeon: Lake Read, MD;  Location: [facility] ORS;  Service: Obstetrics;  Laterality:  Bilateral;   cosmetic repairs     left arm, right knee and back.   LAPAROSCOPIC GASTRIC SLEEVE RESECTION     PANNICULECTOMY     TONSILLECTOMY     OB History     Gravida  5   Para  4   Term  3   Preterm  1   AB  1   Living  4      SAB  1   IAB  0   Ectopic  0   Multiple  0   Live Births  4          Allergies  Allergen Reactions   Penicillins Shortness Of Breath    Pt says she is no longer allergic    Petroleum Distillate Rash   Amitriptyline     Heavy sedation    Nsaids     No oral NSAIDS   Sulfa Antibiotics     Pt can not recall reaction    Latex Rash    ROS: See HPI   Objective  BP 127/87   Pulse 67   Ht 5' 2 (1.575 m)   Wt 166 lb (75.3 kg)   LMP 02/14/2024   Breastfeeding No   BMI 30.36 kg/m   Physical examination   Pelvic:   Vulva: Normal appearance.  No lesions.  Vagina: No lesions or abnormalities noted.  Support: Normal pelvic support.  Urethra No masses tenderness or scarring.  Meatus Normal size without lesions or prolapse.  Cervix: Normal appearance.  No lesions. Pap collected.  Perineum: Normal exam.  No lesions.    Assessment -Abnormal bleeding -Needs Pap f/u -Fatigue/low vitamin D  -Incontinence  Plan -Pap collected. Reviewed f/u -Discussed possible causes of abnormal bleeding. Swabs collected. Reviewed options for hormonal contraception. Will start POPs. Discussed proper administration. -Rx sent for high-dose Vitamin D  x 8 weeks, then will start OTC vitamin D  daily. -Referral sent to urology.    Monike Bragdon, CNM

## 2024-02-16 ENCOUNTER — Encounter: Payer: Self-pay | Admitting: Physician Assistant

## 2024-02-16 ENCOUNTER — Ambulatory Visit: Admitting: Physician Assistant

## 2024-02-16 VITALS — BP 137/87 | HR 77 | Resp 16 | Ht 62.0 in | Wt 166.5 lb

## 2024-02-16 DIAGNOSIS — N920 Excessive and frequent menstruation with regular cycle: Secondary | ICD-10-CM | POA: Diagnosis not present

## 2024-02-16 DIAGNOSIS — D508 Other iron deficiency anemias: Secondary | ICD-10-CM

## 2024-02-16 DIAGNOSIS — F3170 Bipolar disorder, currently in remission, most recent episode unspecified: Secondary | ICD-10-CM

## 2024-02-16 DIAGNOSIS — I10 Essential (primary) hypertension: Secondary | ICD-10-CM

## 2024-02-16 DIAGNOSIS — F203 Undifferentiated schizophrenia: Secondary | ICD-10-CM

## 2024-02-16 DIAGNOSIS — F39 Unspecified mood [affective] disorder: Secondary | ICD-10-CM

## 2024-02-16 DIAGNOSIS — J45909 Unspecified asthma, uncomplicated: Secondary | ICD-10-CM

## 2024-02-16 DIAGNOSIS — E559 Vitamin D deficiency, unspecified: Secondary | ICD-10-CM

## 2024-02-16 DIAGNOSIS — F17290 Nicotine dependence, other tobacco product, uncomplicated: Secondary | ICD-10-CM

## 2024-02-16 DIAGNOSIS — E7849 Other hyperlipidemia: Secondary | ICD-10-CM

## 2024-02-16 LAB — CERVICOVAGINAL ANCILLARY ONLY
Bacterial Vaginitis (gardnerella): NEGATIVE
Candida Glabrata: NEGATIVE
Candida Vaginitis: NEGATIVE
Comment: NEGATIVE
Comment: NEGATIVE
Comment: NEGATIVE

## 2024-02-16 MED ORDER — ALBUTEROL SULFATE HFA 108 (90 BASE) MCG/ACT IN AERS
INHALATION_SPRAY | RESPIRATORY_TRACT | 0 refills | Status: AC
Start: 2024-02-16 — End: ?

## 2024-02-16 NOTE — Progress Notes (Signed)
 Established patient visit  Patient: Michelle Kelley   DOB: 09/24/1985   38 y.o. Female  MRN: [id number] Visit Date: 02/16/2024  Today's healthcare provider: Jolynn Spencer, PA-C   Chief Complaint  Patient presents with   Follow-up  Subjective     Discussed the use of AI scribe software for clinical note transcription with the patient, who gave verbal consent to proceed.  History of Present Illness Michelle Kelley is a 38 year old female with hypertension and asthma who presents with fatigue and medication management.  She experiences fatigue, which she partly attributes to a recent unexpected Pap smear. She is on vitamin D  supplements due to low levels. Her current medications include lisinopril , amlodipine , cetirizine, and Lipitor.  She manages hypertension with lisinopril  and amlodipine . Home blood pressure readings reach 155/101, though clinic readings are better. She questions the accuracy of her home blood pressure cuff.  Asthma is present, with attacks once or twice a month. She lacks an inhaler but uses a nebulizer occasionally. Symptoms have improved since quitting cigarettes. She has postnasal drip allergies.  She feels a sensation of a lump in her throat, which is tight but not painful and does not affect swallowing pills. Her mother has Hashimoto's disease.  She vapes, using one small device every two weeks, and has quit smoking cigarettes. She awaits contact from a psychiatrist for mental health support. Her teenager recently moved back home to assist with household responsibilities.     02/16/2024    9:06 AM 12/28/2023    2:52 PM 12/16/2020    2:48 PM  Depression screen PHQ 2/9  Decreased Interest 1 2 0  Down, Depressed, Hopeless 2 1 0  PHQ - 2 Score 3 3 0  Altered sleeping 3 3   Tired, decreased energy 3 3   Change in appetite 1 2   Feeling bad or failure about yourself  2 2   Trouble concentrating 2 3   Moving slowly or fidgety/restless 1 3   Suicidal thoughts 0  0   PHQ-9 Score 15 19   Difficult doing work/chores Somewhat difficult Extremely dIfficult       02/16/2024    9:06 AM 12/28/2023    2:53 PM  GAD 7 : Generalized Anxiety Score  Nervous, Anxious, on Edge 3 3  Control/stop worrying 2 2  Worry too much - different things 3 3  Trouble relaxing 3 2  Restless 1 3  Easily annoyed or irritable 3 3  Afraid - awful might happen 0 1  Total GAD 7 Score 15 17  Anxiety Difficulty Very difficult Extremely difficult    Medications: Outpatient Medications Prior to Visit  Medication Sig   acetaminophen  (TYLENOL ) 500 MG tablet Take 2 tablets (1,000 mg total) by mouth every 6 (six) hours as needed for fever or headache.   amLODipine  (NORVASC ) 5 MG tablet Take 1 tablet (5 mg total) by mouth daily.   amphetamine-dextroamphetamine (ADDERALL) 10 MG tablet Take 10 mg by mouth 2 (two) times daily with a meal.   atorvastatin  (LIPITOR) 10 MG tablet Take 1 tablet (10 mg total) by mouth daily.   Blood Pressure Monitoring (BLOOD PRESSURE KIT) DEVI Any brand available, measure BP daily   levocetirizine (XYZAL ) 5 MG tablet Take 1 tablet (5 mg total) by mouth every evening.   lisinopril  (ZESTRIL ) 2.5 MG tablet Take 1 tablet (2.5 mg total) by mouth daily.   norethindrone  (MICRONOR ) 0.35 MG tablet Take 1 tablet (0.35 mg total) by mouth daily.  ondansetron  (ZOFRAN  ODT) 4 MG disintegrating tablet Take 1 tablet (4 mg total) by mouth every 6 (six) hours as needed for nausea.   Vitamin D , Ergocalciferol , (DRISDOL ) 1.25 MG (50000 UNIT) CAPS capsule Take 1 capsule (50,000 Units total) by mouth every 7 (seven) days.   [DISCONTINUED] albuterol  (VENTOLIN  HFA) 108 (90 Base) MCG/ACT inhaler Inhale 2 puffs into the lungs every 6 (six) hours as needed for wheezing or shortness of breath.   No facility-administered medications prior to visit.    Review of Systems  All other systems reviewed and are negative.  All negative Except see HPI       Objective    BP 137/87 (BP  Location: Right Arm, Patient Position: Sitting, Cuff Size: Normal)   Pulse 77   Resp 16   Ht 5' 2 (1.575 m)   Wt 166 lb 8 oz (75.5 kg)   LMP 02/14/2024   BMI 30.45 kg/m     Physical Exam Vitals reviewed.  Constitutional:      General: She is not in acute distress.    Appearance: Normal appearance. She is well-developed. She is not diaphoretic.  HENT:     Head: Normocephalic and atraumatic.  Eyes:     General: No scleral icterus.    Conjunctiva/sclera: Conjunctivae normal.  Neck:     Thyroid : No thyromegaly.  Cardiovascular:     Rate and Rhythm: Normal rate and regular rhythm.     Pulses: Normal pulses.     Heart sounds: Normal heart sounds. No murmur heard. Pulmonary:     Effort: Pulmonary effort is normal. No respiratory distress.     Breath sounds: Normal breath sounds. No wheezing, rhonchi or rales.  Musculoskeletal:     Cervical back: Neck supple.     Right lower leg: No edema.     Left lower leg: No edema.  Lymphadenopathy:     Cervical: No cervical adenopathy.  Skin:    General: Skin is warm and dry.     Findings: No rash.  Neurological:     Mental Status: She is alert and oriented to person, place, and time. Mental status is at baseline.  Psychiatric:        Mood and Affect: Mood normal.        Behavior: Behavior normal.      Results for orders placed or performed in visit on 02/16/24  Lipid panel  Result Value Ref Range   Cholesterol, Total 166 100 - 199 mg/dL   Triglycerides 61 0 - 149 mg/dL   HDL WILL FOLLOW    VLDL Cholesterol Cal WILL FOLLOW    LDL Chol Calc (NIH) WILL FOLLOW    LDL CALC COMMENT: WILL FOLLOW    Chol/HDL Ratio WILL FOLLOW   CBC with Differential/Platelet  Result Value Ref Range   WBC 7.1 3.4 - 10.8 x10E3/uL   RBC 4.53 3.77 - 5.28 x10E6/uL   Hemoglobin 13.4 11.1 - 15.9 g/dL   Hematocrit 58.1 65.9 - 46.6 %   MCV 92 79 - 97 fL   MCH 29.6 26.6 - 33.0 pg   MCHC 32.1 31.5 - 35.7 g/dL   RDW 86.2 88.2 - 84.5 %   Platelets 340 150  - 450 x10E3/uL   Neutrophils 56 Not Estab. %   Lymphs 34 Not Estab. %   Monocytes 7 Not Estab. %   Eos 2 Not Estab. %   Basos 1 Not Estab. %   Neutrophils Absolute 3.9 1.4 - 7.0 x10E3/uL   Lymphocytes  Absolute 2.4 0.7 - 3.1 x10E3/uL   Monocytes Absolute 0.5 0.1 - 0.9 x10E3/uL   EOS (ABSOLUTE) 0.2 0.0 - 0.4 x10E3/uL   Basophils Absolute 0.1 0.0 - 0.2 x10E3/uL   Immature Granulocytes 0 Not Estab. %   Immature Grans (Abs) 0.0 0.0 - 0.1 x10E3/uL        Assessment & Plan Excessive and frequent menstruation /menorrhagia with iron  deficiency anemia Continued excessive menstrual bleeding causing iron  deficiency anemia. OB/GYN prescribed birth control. Hematology follow-up pending for elevated white blood cells and anemia. - Continue birth control as prescribed by OB/GYN. - Follow up with hematology for elevated white blood cells and anemia. - Ensure compliance with iron  supplementation.  Essential hypertension Chronic Inconsistent blood pressure readings, some elevated at home. Current medications include lisinopril  2.5 mgand amlodipine  5mg . Home cuff accuracy questioned. - Continue lisinopril  and amlodipine . - Adopt a low-salt diet. - Bring home blood pressure cuff for calibration. - Return for follow-up in six weeks or sooner if blood pressure increases.  Asthma Asthma not well-controlled with occasional attacks. No current inhaler, previous use of red and blue inhalers, and has a nebulizer. - Refer to allergist for evaluation and management. Rx albuterol  until reassessment Will follow-up  Nicotine  dependence, vaping Continues to vape, using one small device every two weeks. Cessation advised Will follow-up  Mood disorder/ Schizophrenia/Bipolar/Depression Awaiting psychiatry contact. Counseling available at clinic. She reports no need for hospitalization. - Offer counseling services at the clinic until psychiatry appointment is scheduled.  Hyperlipidemia Chronic Currently  on Lipitor 10 with reported compliance. - Order lipid panel to assess cholesterol levels.  Vitamin D  deficiency Vitamin D  deficiency identified, supplementation prescribed by OB/GYN. - Continue vitamin D  supplementation as prescribed by OB/GYN.  Follow-Up Follow-up plans discussed for various conditions. - Return for follow-up in six weeks. - Bring home blood pressure cuff and medications to next appointment. - Complete blood work as ordered.  Primary hypertension  - Lipid panel - CBC with Differential/Platelet  Bipolar affective disorder in remission [facility])  - Ambulatory referral to Psychiatry  Episodic mood disorder (HCC)  - Ambulatory referral to Psychiatry  Undifferentiated schizophrenia (HCC)  - Ambulatory referral to Psychiatry  Vaping nicotine  dependence, tobacco product  - Ambulatory referral to Psychiatry  Uncomplicated asthma, unspecified asthma severity, unspecified whether persistent  - albuterol  (VENTOLIN  HFA) 108 (90 Base) MCG/ACT inhaler; Inhale 2 puffs into the lungs every 6 (six) hours as needed for wheezing or shortness of breath.  Dispense: 18 g; Refill: 0 - Ambulatory referral to Allergy  Orders Placed This Encounter  Procedures   Lipid panel    Has the patient fasted?:   Yes   CBC with Differential/Platelet   Ambulatory referral to Psychiatry    Referral Priority:   Routine    Referral Type:   Psychiatric    Referral Reason:   Specialty Services Required    Requested Specialty:   Psychiatry    Number of Visits Requested:   1   Ambulatory referral to Allergy    Referral Priority:   Routine    Referral Type:   Allergy Testing    Referral Reason:   Specialty Services Required    Requested Specialty:   Allergy    Number of Visits Requested:   1    Return in about 6 weeks (around 03/29/2024) for BP f/u.   The patient was advised to call back or seek an in-person evaluation if the symptoms worsen or if the condition fails to improve as  anticipated.  I discussed the assessment and treatment plan with the patient. The patient was provided an opportunity to ask questions and all were answered. The patient agreed with the plan and demonstrated an understanding of the instructions.  I, Brandee Markin, PA-C have reviewed all documentation for this visit. The documentation on 02/16/2024  for the exam, diagnosis, procedures, and orders are all accurate and complete.  Jolynn Spencer, [facility], MMS [facility] [phone] (phone) [phone] (fax)  [facility]

## 2024-02-17 LAB — URINE CULTURE

## 2024-02-19 DIAGNOSIS — E559 Vitamin D deficiency, unspecified: Secondary | ICD-10-CM | POA: Insufficient documentation

## 2024-02-19 DIAGNOSIS — N920 Excessive and frequent menstruation with regular cycle: Secondary | ICD-10-CM | POA: Insufficient documentation

## 2024-02-19 DIAGNOSIS — F17290 Nicotine dependence, other tobacco product, uncomplicated: Secondary | ICD-10-CM | POA: Insufficient documentation

## 2024-02-19 DIAGNOSIS — E7849 Other hyperlipidemia: Secondary | ICD-10-CM | POA: Insufficient documentation

## 2024-02-19 DIAGNOSIS — J45909 Unspecified asthma, uncomplicated: Secondary | ICD-10-CM | POA: Insufficient documentation

## 2024-02-19 DIAGNOSIS — I10 Essential (primary) hypertension: Secondary | ICD-10-CM | POA: Insufficient documentation

## 2024-02-20 ENCOUNTER — Ambulatory Visit: Payer: Self-pay | Admitting: Obstetrics

## 2024-02-20 LAB — CYTOLOGY - PAP
Chlamydia: NEGATIVE
Comment: NEGATIVE
Comment: NEGATIVE
Comment: NEGATIVE
Comment: NORMAL
Diagnosis: NEGATIVE
High risk HPV: NEGATIVE
Neisseria Gonorrhea: NEGATIVE
Trichomonas: NEGATIVE

## 2024-02-21 ENCOUNTER — Other Ambulatory Visit (HOSPITAL_COMMUNITY): Payer: Self-pay

## 2024-02-21 ENCOUNTER — Encounter: Payer: Self-pay | Admitting: Oncology

## 2024-02-26 LAB — CBC WITH DIFFERENTIAL/PLATELET
Basophils Absolute: 0.1 x10E3/uL (ref 0.0–0.2)
Basos: 1 %
EOS (ABSOLUTE): 0.2 x10E3/uL (ref 0.0–0.4)
Eos: 2 %
Hematocrit: 41.8 % (ref 34.0–46.6)
Hemoglobin: 13.4 g/dL (ref 11.1–15.9)
Immature Grans (Abs): 0 x10E3/uL (ref 0.0–0.1)
Immature Granulocytes: 0 %
Lymphocytes Absolute: 2.4 x10E3/uL (ref 0.7–3.1)
Lymphs: 34 %
MCH: 29.6 pg (ref 26.6–33.0)
MCHC: 32.1 g/dL (ref 31.5–35.7)
MCV: 92 fL (ref 79–97)
Monocytes Absolute: 0.5 x10E3/uL (ref 0.1–0.9)
Monocytes: 7 %
Neutrophils Absolute: 3.9 x10E3/uL (ref 1.4–7.0)
Neutrophils: 56 %
Platelets: 340 x10E3/uL (ref 150–450)
RBC: 4.53 x10E6/uL (ref 3.77–5.28)
RDW: 13.7 % (ref 11.7–15.4)
WBC: 7.1 x10E3/uL (ref 3.4–10.8)

## 2024-02-26 LAB — LIPID PANEL
Cholesterol, Total: 166 mg/dL (ref 100–199)
Triglycerides: 61 mg/dL (ref 0–149)

## 2024-02-27 ENCOUNTER — Ambulatory Visit: Payer: Self-pay | Admitting: Physician Assistant

## 2024-02-28 ENCOUNTER — Other Ambulatory Visit (HOSPITAL_COMMUNITY): Payer: Self-pay

## 2024-02-28 ENCOUNTER — Telehealth: Payer: Self-pay | Admitting: Pharmacy Technician

## 2024-02-28 NOTE — Telephone Encounter (Signed)
 Pharmacy Patient Advocate Encounter   Received notification from Onbase that prior authorization for Albuterol  Sulfate HFA 108 (90 Base)MCG/ACT aerosol  is required/requested.   Insurance verification completed.   The patient is insured through [employer] .   Per test claim:  Brand name Ventolin  HFA is preferred by the insurance.  If suggested medication is appropriate, Please send in a new RX and discontinue this one. If not, please advise as to why it's not appropriate so that we may request a Prior Authorization. Please note, some preferred medications may still require a PA.  If the suggested medications have not been trialed and there are no contraindications to their use, the PA will not be submitted, as it will not be approved.  New prescription is not required. Medications are interchangeable at the pharmacy as brand preferred. Patient's pharmacy dispensed brand name on 02/20/2024. Next refill is due on or after 03/28/2024.

## 2024-03-04 ENCOUNTER — Other Ambulatory Visit: Payer: Self-pay | Admitting: Physician Assistant

## 2024-03-04 DIAGNOSIS — I10 Essential (primary) hypertension: Secondary | ICD-10-CM

## 2024-03-11 ENCOUNTER — Ambulatory Visit: Admitting: Urology

## 2024-03-13 ENCOUNTER — Telehealth: Payer: Self-pay | Admitting: Obstetrics and Gynecology

## 2024-03-13 ENCOUNTER — Other Ambulatory Visit: Payer: Self-pay

## 2024-03-13 DIAGNOSIS — Z3009 Encounter for other general counseling and advice on contraception: Secondary | ICD-10-CM

## 2024-03-13 NOTE — Telephone Encounter (Signed)
 Contacted the patient x2. The patient was scheduled with Dr Janit for 9/30. Do to Dr. Janit no longer with us . We have submitted a referral for the patient to go [facility]. I left message for the patient to contact our office.

## 2024-03-18 ENCOUNTER — Ambulatory Visit: Payer: Self-pay

## 2024-03-18 ENCOUNTER — Encounter: Payer: Self-pay | Admitting: Physician Assistant

## 2024-03-18 ENCOUNTER — Emergency Department (HOSPITAL_COMMUNITY)

## 2024-03-18 ENCOUNTER — Emergency Department (HOSPITAL_COMMUNITY)
Admission: EM | Admit: 2024-03-18 | Discharge: 2024-03-18 | Disposition: A | Discharge status: Home / Self Care | Attending: Emergency Medicine | Admitting: Emergency Medicine

## 2024-03-18 ENCOUNTER — Other Ambulatory Visit: Payer: Self-pay

## 2024-03-18 ENCOUNTER — Encounter (HOSPITAL_COMMUNITY): Payer: Self-pay

## 2024-03-18 ENCOUNTER — Ambulatory Visit: Admitting: Physician Assistant

## 2024-03-18 VITALS — BP 139/86 | HR 84 | Ht 62.0 in | Wt 168.3 lb

## 2024-03-18 DIAGNOSIS — F203 Undifferentiated schizophrenia: Secondary | ICD-10-CM

## 2024-03-18 DIAGNOSIS — F39 Unspecified mood [affective] disorder: Secondary | ICD-10-CM | POA: Diagnosis not present

## 2024-03-18 DIAGNOSIS — R197 Diarrhea, unspecified: Secondary | ICD-10-CM | POA: Insufficient documentation

## 2024-03-18 DIAGNOSIS — E7849 Other hyperlipidemia: Secondary | ICD-10-CM

## 2024-03-18 DIAGNOSIS — R1032 Left lower quadrant pain: Secondary | ICD-10-CM | POA: Insufficient documentation

## 2024-03-18 DIAGNOSIS — I10 Essential (primary) hypertension: Secondary | ICD-10-CM | POA: Diagnosis not present

## 2024-03-18 DIAGNOSIS — F17290 Nicotine dependence, other tobacco product, uncomplicated: Secondary | ICD-10-CM | POA: Diagnosis not present

## 2024-03-18 LAB — COMPREHENSIVE METABOLIC PANEL WITH GFR
ALT: 29 U/L (ref 0–44)
AST: 40 U/L (ref 15–41)
Albumin: 4.2 g/dL (ref 3.5–5.0)
Alkaline Phosphatase: 103 U/L (ref 38–126)
Anion gap: 13 (ref 5–15)
BUN: 6 mg/dL (ref 6–20)
CO2: 20 mmol/L — ABNORMAL LOW (ref 22–32)
Calcium: 9 mg/dL (ref 8.9–10.3)
Chloride: 103 mmol/L (ref 98–111)
Creatinine, Ser: 0.59 mg/dL (ref 0.44–1.00)
GFR, Estimated: >60 mL/min (ref >60)
Glucose, Bld: 109 mg/dL — ABNORMAL HIGH (ref 70–99)
Potassium: 4.2 mmol/L (ref 3.5–5.1)
Sodium: 136 mmol/L (ref 135–145)
Total Bilirubin: 0.7 mg/dL (ref 0.0–1.2)
Total Protein: 7.4 g/dL (ref 6.5–8.1)

## 2024-03-18 LAB — CBC WITH DIFFERENTIAL/PLATELET
Abs Immature Granulocytes: 0.03 K/uL (ref 0.00–0.07)
Basophils Absolute: 0.1 K/uL (ref 0.0–0.1)
Basophils Relative: 1 %
Eosinophils Absolute: 0.1 K/uL (ref 0.0–0.5)
Eosinophils Relative: 1 %
HCT: 41.2 % (ref 36.0–46.0)
Hemoglobin: 13.8 g/dL (ref 12.0–15.0)
Immature Granulocytes: 0 %
Lymphocytes Relative: 25 %
Lymphs Abs: 2.4 K/uL (ref 0.7–4.0)
MCH: 30 pg (ref 26.0–34.0)
MCHC: 33.5 g/dL (ref 30.0–36.0)
MCV: 89.6 fL (ref 80.0–100.0)
Monocytes Absolute: 0.5 K/uL (ref 0.1–1.0)
Monocytes Relative: 6 %
Neutro Abs: 6.6 K/uL (ref 1.7–7.7)
Neutrophils Relative %: 67 %
Platelets: 387 K/uL (ref 150–400)
RBC: 4.6 MIL/uL (ref 3.87–5.11)
RDW: 13.5 % (ref 11.5–15.5)
WBC: 9.7 K/uL (ref 4.0–10.5)
nRBC: 0 % (ref 0.0–0.2)

## 2024-03-18 LAB — PREGNANCY, URINE: Preg Test, Ur: NEGATIVE

## 2024-03-18 LAB — URINALYSIS, ROUTINE W REFLEX MICROSCOPIC
Bilirubin Urine: NEGATIVE
Glucose, UA: NEGATIVE mg/dL
Hgb urine dipstick: NEGATIVE
Ketones, ur: 5 mg/dL — AB
Leukocytes,Ua: NEGATIVE
Nitrite: NEGATIVE
Protein, ur: NEGATIVE mg/dL
Specific Gravity, Urine: 1.021 (ref 1.005–1.030)
pH: 5 (ref 5.0–8.0)

## 2024-03-18 LAB — LIPASE, BLOOD: Lipase: 22 U/L (ref 11–51)

## 2024-03-18 LAB — C DIFFICILE QUICK SCREEN W PCR REFLEX
C Diff antigen: NEGATIVE
C Diff interpretation: NOT DETECTED
C Diff toxin: NEGATIVE

## 2024-03-18 LAB — MAGNESIUM: Magnesium: 2 mg/dL (ref 1.7–2.4)

## 2024-03-18 MED ORDER — ONDANSETRON 4 MG PO TBDP: 4.0000 mg | ORAL_TABLET | Freq: Three times a day (TID) | ORAL | 0 refills | Status: DC | PRN

## 2024-03-18 MED ORDER — ONDANSETRON 4 MG PO TBDP: 4.0000 mg | ORAL_TABLET | Freq: Once | ORAL | Status: DC

## 2024-03-18 MED ORDER — LACTATED RINGERS IV BOLUS
1000.0000 mL | Freq: Once | INTRAVENOUS | Status: AC
  Administered 2024-03-18: 1000 mL via INTRAVENOUS

## 2024-03-18 MED ORDER — DICYCLOMINE HCL 10 MG PO CAPS
20.0000 mg | ORAL_CAPSULE | Freq: Once | ORAL | Status: AC
  Administered 2024-03-18: 20 mg via ORAL
  Filled 2024-03-18: qty 2

## 2024-03-18 MED ORDER — OXYCODONE HCL 5 MG PO TABS
5.0000 mg | ORAL_TABLET | Freq: Once | ORAL | Status: DC
Start: 2024-03-18 — End: 2024-03-18

## 2024-03-18 MED ORDER — DICYCLOMINE HCL 20 MG PO TABS: 20.0000 mg | ORAL_TABLET | Freq: Two times a day (BID) | ORAL | 0 refills | Status: DC

## 2024-03-18 MED ORDER — ONDANSETRON HCL 4 MG/2ML IJ SOLN
4.0000 mg | Freq: Once | INTRAMUSCULAR | Status: AC
  Administered 2024-03-18: 4 mg via INTRAVENOUS
  Filled 2024-03-18: qty 2

## 2024-03-18 MED ORDER — IOHEXOL 350 MG/ML SOLN
75.0000 mL | Freq: Once | INTRAVENOUS | Status: AC | PRN
  Administered 2024-03-18: 75 mL via INTRAVENOUS

## 2024-03-18 MED ORDER — LINACLOTIDE 72 MCG PO CAPS: 72.0000 ug | ORAL_CAPSULE | Freq: Every day | ORAL | 0 refills | Status: DC

## 2024-03-18 NOTE — Discharge Instructions (Signed)
 [employer].  Bentyl for pain as prescribed.  Zofran  for nausea.

## 2024-03-18 NOTE — ED Provider Triage Note (Signed)
 Emergency Medicine Provider Triage Evaluation Note  Michelle Kelley , a 38 y.o. female  was evaluated in triage.  Pt complains of diarrhea for 3 weeks as well as left lower quadrant abdominal pain.  Patient reports history of diverticulitis requiring hospitalization.  Patient has had some blood noted in the stool at times.  No fevers but has felt clammy.  Review of Systems  Positive: Diarrhea, left lower quadrant abdominal pain Negative: Fever  Physical Exam  BP (!) 145/100 (BP Location: Left Arm)   Pulse 97   Temp 99 F (37.2 C)   Resp 16   LMP 03/11/2024 (Exact Date)   SpO2 99%  Gen:   Awake, no distress   Resp:  Normal effort  MSK:   Moves extremities without difficulty  Other:  Tenderness left lower quadrant  Medical Decision Making  Medically screening exam initiated at 2:12 PM.  Appropriate orders placed.  Luceal K Schwake was informed that the remainder of the evaluation will be completed by another provider, this initial triage assessment does not replace that evaluation, and the importance of remaining in the ED until their evaluation is complete.  Possible diverticulitis, imaging ordered.   Desiderio Chew, PA-C 03/18/24 1418

## 2024-03-18 NOTE — ED Triage Notes (Signed)
 Pt states she has been having diarrhea for past 3 weeks and rectal bleeding. Pt has hx of diverticulitis. Left sided abdominal pain 5/10.

## 2024-03-18 NOTE — ED Provider Notes (Signed)
 [name] [facility] AT [facility] Provider Note   CSN: [id number] Arrival date & time: 03/18/24  1406     Patient presents with: Abdominal Pain, Rectal Bleeding, and Diarrhea   Michelle Kelley is a 38 y.o. female.    Abdominal Pain Associated symptoms: diarrhea and hematochezia   Rectal Bleeding Associated symptoms: abdominal pain   Diarrhea Associated symptoms: abdominal pain    Patient is a 38 year old female  Pt complains of diarrhea for 3 weeks as well as left lower quadrant abdominal pain.  Patient reports history of diverticulitis requiring hospitalization.  Patient has had some blood noted in the stool at times.  No fevers but has felt clammy.        Prior to Admission medications   Medication Sig Start Date End Date Taking? Authorizing Provider  dicyclomine (BENTYL) 20 MG tablet Take 1 tablet (20 mg total) by mouth 2 (two) times daily. 03/18/24  Yes Sham Alviar, Hamp S, PA  ondansetron  (ZOFRAN -ODT) 4 MG disintegrating tablet Take 1 tablet (4 mg total) by mouth every 8 (eight) hours as needed for nausea or vomiting. 03/18/24  Yes Jasalyn Frysinger S, PA  acetaminophen  (TYLENOL ) 500 MG tablet Take 2 tablets (1,000 mg total) by mouth every 6 (six) hours as needed for fever or headache. 06/12/21   Victor Claudell SAUNDERS, MD  albuterol  (VENTOLIN  HFA) 108 (90 Base) MCG/ACT inhaler Inhale 2 puffs into the lungs every 6 (six) hours as needed for wheezing or shortness of breath. 02/16/24   Ostwalt, Janna, PA-C  amLODipine  (NORVASC ) 5 MG tablet TAKE 1 TABLET BY MOUTH ONCE DAILYDO NOT TAKE THIS MEDICATION IF PREGNANT 03/04/24   Ostwalt, Janna, PA-C  amphetamine-dextroamphetamine (ADDERALL) 10 MG tablet Take 10 mg by mouth 2 (two) times daily with a meal.    [provider]  atorvastatin  (LIPITOR) 10 MG tablet Take 1 tablet (10 mg total) by mouth daily. 01/09/24   Ostwalt, Janna, PA-C  Blood Pressure Monitoring (BLOOD PRESSURE KIT) DEVI Any brand available,  measure BP daily 12/28/23   Ostwalt, Janna, PA-C  levocetirizine (XYZAL ) 5 MG tablet Take 1 tablet (5 mg total) by mouth every evening. 01/10/24   Ostwalt, Janna, PA-C  lisinopril  (ZESTRIL ) 2.5 MG tablet TAKE 1 TABLET BY MOUTH ONCE DAILY DO NOT TAKE THIS MEDICATION IF PREGNANT 03/04/24   Ostwalt, Janna, PA-C  norethindrone  (MICRONOR ) 0.35 MG tablet Take 1 tablet (0.35 mg total) by mouth daily. 02/15/24   Justino Eleanor HERO, CNM  Vitamin D , Ergocalciferol , (DRISDOL ) 1.25 MG (50000 UNIT) CAPS capsule Take 1 capsule (50,000 Units total) by mouth every 7 (seven) days. 02/15/24   Justino Eleanor HERO, CNM    Allergies: Petroleum distillate, Amitriptyline, Nsaids, Sulfa antibiotics, and Latex    Review of Systems  Gastrointestinal:  Positive for abdominal pain, diarrhea and hematochezia.    Updated Vital Signs BP 125/83 (BP Location: Left Arm)   Pulse (!) 58   Temp 98.1 F (36.7 C) (Oral)   Resp 16   Ht 5' 2 (1.575 m)   Wt 77.1 kg   LMP 03/11/2024 (Exact Date)   SpO2 100%   BMI 31.09 kg/m   Physical Exam Vitals and nursing note reviewed.  Constitutional:      General: She is not in acute distress. HENT:     Head: Normocephalic and atraumatic.     Nose: Nose normal.     Mouth/Throat:     Comments: Dry oral mucosa Eyes:     General: No scleral icterus. Cardiovascular:  Rate and Rhythm: Normal rate and regular rhythm.     Pulses: Normal pulses.     Heart sounds: Normal heart sounds.  Pulmonary:     Effort: Pulmonary effort is normal. No respiratory distress.     Breath sounds: No wheezing.  Abdominal:     Palpations: Abdomen is soft.     Tenderness: There is no abdominal tenderness.  Musculoskeletal:     Cervical back: Normal range of motion.     Right lower leg: No edema.     Left lower leg: No edema.  Skin:    General: Skin is warm and dry.     Capillary Refill: Capillary refill takes less than 2 seconds.  Neurological:     Mental Status: She is alert. Mental status is at  baseline.  Psychiatric:        Mood and Affect: Mood normal.        Behavior: Behavior normal.     (all labs ordered are listed, but only abnormal results are displayed) Labs Reviewed  COMPREHENSIVE METABOLIC PANEL WITH GFR - Abnormal; Notable for the following components:      Result Value   CO2 20 (*)    Glucose, Bld 109 (*)    All other components within normal limits  URINALYSIS, ROUTINE W REFLEX MICROSCOPIC - Abnormal; Notable for the following components:   APPearance HAZY (*)    Ketones, ur 5 (*)    All other components within normal limits  C DIFFICILE QUICK SCREEN W PCR REFLEX    CBC WITH DIFFERENTIAL/PLATELET  LIPASE, BLOOD  PREGNANCY, URINE  MAGNESIUM     EKG: None  Radiology: CT ABDOMEN PELVIS W CONTRAST Result Date: 03/18/2024 CLINICAL DATA:  Provided history: Diverticulitis, complication suspected LLQ abdominal pain eval LLQ pain, h/o diverticulitis EXAM: CT ABDOMEN AND PELVIS WITH CONTRAST TECHNIQUE: Multidetector CT imaging of the abdomen and pelvis was performed using the standard protocol following bolus administration of intravenous contrast. RADIATION DOSE REDUCTION: This exam was performed according to the departmental dose-optimization program which includes automated exposure control, adjustment of the mA and/or kV according to patient size and/or use of iterative reconstruction technique. CONTRAST:  75mL OMNIPAQUE IOHEXOL 350 MG/ML SOLN COMPARISON:  None Available. FINDINGS: Lower chest: Clear lung bases.  Small hiatal hernia Hepatobiliary: No focal liver abnormality is seen. No gallstones, gallbladder wall thickening, or biliary dilatation. Pancreas: No ductal dilatation or inflammation. Spleen: Normal in size without focal abnormality. Adrenals/Urinary Tract: Normal adrenal glands. No hydronephrosis or renal inflammation. No visible renal calculi. Partially distended urinary bladder, normal for degree of distension. Stomach/Bowel: Small hiatal hernia. Gastric  sleeve with suture line crossing the diaphragmatic hiatus. No small bowel obstruction or inflammation. Normal appendix. The ascending colon is decompressed. Small volume of formed stool in the left colon. Minimal sigmoid diverticulosis. No diverticulitis or acute colonic inflammation. Vascular/Lymphatic: Normal caliber abdominal aorta. Patent portal, splenic, and mesenteric veins. No abdominopelvic adenopathy. Reproductive: 4 cm right adnexal cyst, homogeneous minimally higher than simple fluid density at 25 Hounsfield units. The left ovary is normal in CT appearance. Unremarkable appearance of the uterus. Other: No free air, free fluid, or intra-abdominal fluid collection. No abdominal wall hernia. Prior Caesarean section. Musculoskeletal: Multiple tiny sclerotic densities throughout the osseous structures, likely small bone islands. There are no acute or suspicious osseous abnormalities. IMPRESSION: 1. No findings to account for left lower quadrant pain. 2. Minimal sigmoid diverticulosis without diverticulitis. 3. Right adnexal cyst measuring 4 cm, minimally higher than simple fluid density. Favor hemorrhagic  cyst. Consider pelvic ultrasound for characterization if there are referable symptoms. 4. Small hiatal hernia. Gastric sleeve with suture line crossing the diaphragmatic hiatus. Electronically Signed   By: Andrea Gasman M.D.   On: 03/18/2024 16:28     Procedures   Medications Ordered in the ED  iohexol (OMNIPAQUE) 350 MG/ML injection 75 mL (75 mLs Intravenous Contrast Given 03/18/24 1609)  ondansetron  (ZOFRAN ) injection 4 mg (4 mg Intravenous Given 03/18/24 1943)  lactated ringers  bolus 1,000 mL (0 mLs Intravenous Stopped 03/18/24 2151)  dicyclomine (BENTYL) capsule 20 mg (20 mg Oral Given 03/18/24 1940)                                     Medical Decision Making Risk Prescription drug management.   This patient presents to the ED for concern of abd pain diarrhea, this involves a number  of treatment options, and is a complaint that carries with it a moderate risk of complications and morbidity. A differential diagnosis was considered for the patient's symptoms which is discussed below:   The causes of generalized abdominal pain include but are not limited to AAA, mesenteric ischemia, appendicitis, diverticulitis, DKA, gastritis, gastroenteritis, AMI, nephrolithiasis, pancreatitis, peritonitis, adrenal insufficiency,lead poisoning, iron  toxicity, intestinal ischemia, constipation, UTI,SBO/LBO, splenic rupture, biliary disease, IBD, IBS, PUD, or hepatitis. Ectopic pregnancy, ovarian torsion, PID.  The differential diagnosis of diarrhea includes but is not limited to Viral- norovirus/rotavirus; Bacterial-Campylobacter,Shigella, Salmonella, Escherichia coli, E. coli 0157:H7, Yersinia enterocolitica, Vibrio cholerae, Clostridium difficile. Parasitic- Giardia lamblia, Cryptosporidium,Entamoeba histolytica,Cyclospora, Microsporidium. Toxin- Staphylococcus aureus, Bacillus cereus. Noninfectious causes include GI Bleed, Appendicitis, Mesenteric Ischemia, Diverticulitis, Adrenal Crisis, Thyroid  Storm, Toxicologic exposures, Antibiotic or drug-associated, inflammatory bowel disease.   Co morbidities: Discussed in HPI   Brief History:  Patient is a 38 year old female  Pt complains of diarrhea for 3 weeks as well as left lower quadrant abdominal pain.  Patient reports history of diverticulitis requiring hospitalization.  Patient has had some blood noted in the stool at times.  No fevers but has felt clammy.    Nausea but no vomiting.    EMR reviewed including pt PMHx, past surgical history and past visits to ER.   See HPI for more details   Lab Tests:   I personally reviewed all laboratory work and imaging. Metabolic panel without any acute abnormality specifically kidney function within normal limits and no significant electrolyte abnormalities. CBC without leukocytosis or  significant anemia. Mag nml C.diff PCR ordered  Imaging Studies:  Abnormal findings. I personally reviewed all imaging studies. Imaging notable for  IMPRESSION:  1. No findings to account for left lower quadrant pain.  2. Minimal sigmoid diverticulosis without diverticulitis.  3. Right adnexal cyst measuring 4 cm, minimally higher than simple  fluid density. Favor hemorrhagic cyst. Consider pelvic ultrasound  for characterization if there are referable symptoms.  4. Small hiatal hernia. Gastric sleeve with suture line crossing the  diaphragmatic hiatus.    Cardiac Monitoring:  The patient was maintained on a cardiac monitor.  I personally viewed and interpreted the cardiac monitored which showed an underlying rhythm of: NSR NA   Medicines ordered:  I ordered medication including LR, zofran , bentyl  for abd pain and hydration Reevaluation of the patient after these medicines showed that the patient improved I have reviewed the patients home medicines and have made adjustments as needed   Critical Interventions:     Consults/Attending Physician  Reevaluation:  After the interventions noted above I re-evaluated patient and found that they have :improved   Social Determinants of Health:      Problem List / ED Course:  Nausea and diarrhea.  Well-appearing on exam no abdominal tenderness no guarding or rebound labs reassuringly normal will prescribe Zofran  and Bentyl and recommend p.o. hydration.  Patient is tolerating p.o. without difficulty.   Dispostion:  After consideration of the diagnostic results and the patients response to treatment, I feel that the patent would benefit from outpatient follow up.    Final diagnoses:  Diarrhea, unspecified type    ED Discharge Orders          Ordered    ondansetron  (ZOFRAN -ODT) 4 MG disintegrating tablet  Every 8 hours PRN        03/18/24 2109    dicyclomine (BENTYL) 20 MG tablet  2 times daily         03/18/24 2109               Neldon Hamp RAMAN, [place] 03/18/24 2352    Tegeler, Lonni PARAS, MD 03/19/24 [phone]

## 2024-03-18 NOTE — Telephone Encounter (Signed)
 Please contact the patient as soon as possible at [phone]  FYI Only or Action Required?: Action required by provider: clinical question for provider.  Patient was last seen in primary care on 03/18/2024 by Ostwalt, Janna, PA-C.  Called Nurse Triage reporting Medication Problem.  Symptoms began today.  Interventions attempted: Nothing.  Symptoms are: stable.  Triage Disposition: Call PCP Now  Patient/caregiver understands and will follow disposition?: Yes  Copied from CRM #[id number]. Topic: Clinical - Medication Question >> Mar 18, 2024 12:26 PM Shanda MATSU wrote: Reason for CRM: Patient's mother, Greig Blush, is calling in with questions in regards to med, linaclotide (LINZESS) 72 MCG capsule, Caller wants to confirm if this is the correct med for patient to take for diarrhea as a side effect of the med is also diarrhea. Caller can be reached at  [phone].  Reason for Disposition  [1] Caller has URGENT medicine question about med that primary care doctor (or NP/PA) or specialist prescribed AND [2] triager unable to answer question    Attempted to contact CAL x 2, no answer. Requesting patient to be called back.  Answer Assessment - Initial Assessment Questions 1. NAME of MEDICINE: What medicine(s) are you calling about?     Linzess  2. QUESTION: What is your question? (e.g., double dose of medicine, side effect)     Did she mean to prescribe this with me having Diarrhea?  3. PRESCRIBER: Who prescribed the medicine? Reason: if prescribed by specialist, call should be referred to that group.     Janna Ostwalt  4. SYMPTOMS: Do you have any symptoms? If Yes, ask: What symptoms are you having?  How bad are the symptoms (e.g., mild, moderate, severe)     Diarrhea  Protocols used: Medication Question Call-A-AH

## 2024-03-18 NOTE — Progress Notes (Signed)
 Established patient visit  Patient: Michelle Kelley   DOB: 05/31/1986   38 y.o. Female  MRN: [id number] Visit Date: 03/18/2024  Today's healthcare provider: Jolynn Spencer, PA-C   Chief Complaint  Patient presents with   Diarrhea    Frequency: 3 weeks   Subjective   Discussed the use of AI scribe software for clinical note transcription with the patient, who gave verbal consent to proceed.  History of Present Illness Michelle Kelley is a 38 year old female with diverticular disease who presents with three weeks of persistent diarrhea. She is accompanied by her boyfriend.  She experiences diarrhea every thirty minutes, with stool ranging from small squirts of yellow and white to more voluminous liquid. Dietary modifications, including clear broths and soups, have been attempted, and reintroducing solid foods like salads may have worsened symptoms. She feels clammy and cold but has not checked for fever. Abdominal pain is present, particularly under her rib, with swelling on the left side.  A similar episode occurred five to six years ago, though less severe. Over-the-counter medications like  Pepto-Bismol have not provided relief. She reports being gassy and had a brief episode of bright red blood, likely from irritation due to frequent wiping. Wet wipes are used to mitigate this issue. Hydration is maintained by drinking plenty of water.  Current medications include amlodipine  and lisinopril  for blood pressure, and Lipitor for cholesterol. She denies taking vitamins despite a previous high vitamin B level. She smoked last night.       03/18/2024   11:06 AM 02/16/2024    9:06 AM 12/28/2023    2:52 PM  Depression screen PHQ 2/9  Decreased Interest 2 1 2   Down, Depressed, Hopeless 1 2 1   PHQ - 2 Score 3 3 3   Altered sleeping 2 3 3   Tired, decreased energy 3 3 3   Change in appetite 2 1 2   Feeling bad or failure about yourself  1 2 2   Trouble concentrating 3 2 3   Moving slowly or  fidgety/restless 3 1 3   Suicidal thoughts 0 0 0  PHQ-9 Score 17 15 19   Difficult doing work/chores Somewhat difficult Somewhat difficult Extremely dIfficult      03/18/2024   11:07 AM 02/16/2024    9:06 AM 12/28/2023    2:53 PM  GAD 7 : Generalized Anxiety Score  Nervous, Anxious, on Edge 2 3 3   Control/stop worrying 2 2 2   Worry too much - different things 1 3 3   Trouble relaxing 3 3 2   Restless 2 1 3   Easily annoyed or irritable 3 3 3   Afraid - awful might happen 0 0 1  Total GAD 7 Score 13 15 17   Anxiety Difficulty Somewhat difficult Very difficult Extremely difficult    Medications: Outpatient Medications Prior to Visit  Medication Sig   acetaminophen  (TYLENOL ) 500 MG tablet Take 2 tablets (1,000 mg total) by mouth every 6 (six) hours as needed for fever or headache.   albuterol  (VENTOLIN  HFA) 108 (90 Base) MCG/ACT inhaler Inhale 2 puffs into the lungs every 6 (six) hours as needed for wheezing or shortness of breath.   amLODipine  (NORVASC ) 5 MG tablet TAKE 1 TABLET BY MOUTH ONCE DAILY***DO NOT TAKE THIS MEDICATION IF PREGNANT***   amphetamine-dextroamphetamine (ADDERALL) 10 MG tablet Take 10 mg by mouth 2 (two) times daily with a meal.   atorvastatin  (LIPITOR) 10 MG tablet Take 1 tablet (10 mg total) by mouth daily.   Blood Pressure Monitoring (BLOOD PRESSURE KIT) DEVI  Any brand available, measure BP daily   levocetirizine (XYZAL ) 5 MG tablet Take 1 tablet (5 mg total) by mouth every evening.   lisinopril  (ZESTRIL ) 2.5 MG tablet TAKE 1 TABLET BY MOUTH ONCE DAILY ***DO NOT TAKE THIS MEDICATION IF PREGNANT***   norethindrone  (MICRONOR ) 0.35 MG tablet Take 1 tablet (0.35 mg total) by mouth daily.   ondansetron  (ZOFRAN  ODT) 4 MG disintegrating tablet Take 1 tablet (4 mg total) by mouth every 6 (six) hours as needed for nausea.   Vitamin D , Ergocalciferol , (DRISDOL ) 1.25 MG (50000 UNIT) CAPS capsule Take 1 capsule (50,000 Units total) by mouth every 7 (seven) days.   No  facility-administered medications prior to visit.    Review of Systems  All other systems reviewed and are negative.  All negative Except see HPI    {See past labs  Heme  Chem  Endocrine  Serology  Results Review (optional):1}   Objective    BP 139/86 (BP Location: Left Arm, Patient Position: Sitting, Cuff Size: Normal)   Pulse 84   Ht 5' 2 (1.575 m)   Wt 168 lb 4.8 oz (76.3 kg)   LMP 03/11/2024 (Exact Date)   SpO2 100%   BMI 30.78 kg/m     Physical Exam Vitals reviewed.  Constitutional:      General: She is not in acute distress.    Appearance: Normal appearance. She is well-developed. She is not diaphoretic.  HENT:     Head: Normocephalic and atraumatic.  Eyes:     General: No scleral icterus.    Conjunctiva/sclera: Conjunctivae normal.  Neck:     Thyroid : No thyromegaly.  Cardiovascular:     Rate and Rhythm: Normal rate and regular rhythm.     Pulses: Normal pulses.     Heart sounds: Normal heart sounds. No murmur heard. Pulmonary:     Effort: Pulmonary effort is normal. No respiratory distress.     Breath sounds: Normal breath sounds. No wheezing, rhonchi or rales.  Musculoskeletal:     Cervical back: Neck supple.     Right lower leg: No edema.     Left lower leg: No edema.  Lymphadenopathy:     Cervical: No cervical adenopathy.  Skin:    General: Skin is warm and dry.     Findings: No rash.  Neurological:     Mental Status: She is alert and oriented to person, place, and time. Mental status is at baseline.  Psychiatric:        Mood and Affect: Mood normal.        Behavior: Behavior normal.      No results found for any visits on 03/18/24.      Assessment & Plan Persistent diarrhea with abdominal pain and possible rectal bleeding in the setting of diverticular disease of colon 3 weeks X 3 weeks Chronic diarrhea for three weeks with abdominal pain and possible rectal bleeding, likely a flare of diverticular disease. Differential includes  infection, dietary causes, or diverticulitis. Tried Pepto-Bismol with limited relief. Possible dietary trigger from pre-packaged salads. No significant blood in stool, likely due to irritation from frequent wiping. - Order lab tests for infection and electrolytes. - Refer to gastroenterology for further evaluation. - Advise clear fluid diet and slow reintroduction of foods. - Consider over-the-counter loperamide for diarrhea control, not to exceed 2-3 days. Consider rifaximin? Consider antispasmodic - Advise against flu vaccination until diarrhea resolves.  Essential hypertension Chronic and not stable  hypertension managed with amlodipine  5 mg and lisinopril  2.5  mg. Blood pressure control needs monitoring. - Continue current antihypertensive medications. - Order lipid panel to assess cholesterol management. Continue low-salt diet and regular exercise Will follow-up  Hyperlipidemia Chronic  hyperlipidemia managed with Lipitor. Cholesterol levels need reassessment to determine if medication adjustment is necessary. - Continue current cholesterol medication. - Order lipid panel to assess cholesterol management. Continue low-cholesterol diet and regular exercise Will follow-up  Mood disorde Mood disorder with past mention of borderline personality disorder. Reports improvement in mood since last visit. No current behavioral health provider established. - Provide contact information for behavioral health services. - Offer counseling services at the clinic until behavioral health is established. Control of mental health could help with IBS control Collaboration of Care: Medication Management AEB  , Primary Care Provider AEB  , Psychiatrist AEB  , and Referral or follow-up with counselor/therapist AEB    Patient/Guardian was advised Release of Information must be obtained prior to any record release in order to collaborate their care with an outside provider. Patient/Guardian was advised if  they have not already done so to contact the registration department to sign all necessary forms in order for us  to release information regarding their care.   Consent: Patient/Guardian gives verbal consent for treatment and assignment of benefits for services provided during this visit. Patient/Guardian expressed understanding and agreed to proceed.     Primary hypertension (Primary)  - Lipid panel  Episodic mood disorder  - Amb ref to Integrated Behavioral Health  Undifferentiated schizophrenia (HCC)  - Amb ref to Integrated Behavioral Health  Vaping nicotine  dependence, tobacco product Chronic cessation advised   Diarrhea, unspecified type  - CBC w/Diff/Platelet - C-reactive protein - Comprehensive metabolic panel with GFR - Ambulatory referral to Gastroenterology - GI Profile, Stool, PCR   Orders Placed This Encounter  Procedures   CBC w/Diff/Platelet   C-reactive protein   Comprehensive metabolic panel with GFR   Lipid panel    Has the patient fasted?:   Yes   GI Profile, Stool, PCR   Ambulatory referral to Gastroenterology    Referral Priority:   Routine    Referral Type:   Consultation    Referral Reason:   Specialty Services Required    Number of Visits Requested:   1    No follow-ups on file.   The patient was advised to call back or seek an in-person evaluation if the symptoms worsen or if the condition fails to improve as anticipated.  I discussed the assessment and treatment plan with the patient. The patient was provided an opportunity to ask questions and all were answered. The patient agreed with the plan and demonstrated an understanding of the instructions.  I, Rorie Delmore, PA-C have reviewed all documentation for this visit. The documentation on 03/18/2024  for the exam, diagnosis, procedures, and orders are all accurate and complete.  Jolynn Spencer, [facility], MMS [place] [phone] (phone) [phone] (fax)  [facility]

## 2024-03-19 ENCOUNTER — Ambulatory Visit: Admitting: Obstetrics and Gynecology

## 2024-03-20 ENCOUNTER — Encounter: Payer: Self-pay | Admitting: Psychiatry

## 2024-03-20 ENCOUNTER — Other Ambulatory Visit: Payer: Self-pay

## 2024-03-20 ENCOUNTER — Ambulatory Visit: Payer: Self-pay | Admitting: Psychiatry

## 2024-03-20 VITALS — HR 76 | Temp 97.8°F | Ht 62.0 in | Wt 166.4 lb

## 2024-03-20 DIAGNOSIS — F603 Borderline personality disorder: Secondary | ICD-10-CM | POA: Diagnosis not present

## 2024-03-20 DIAGNOSIS — F251 Schizoaffective disorder, depressive type: Secondary | ICD-10-CM | POA: Diagnosis not present

## 2024-03-20 MED ORDER — ARIPIPRAZOLE 5 MG PO TABS: 5.0000 mg | ORAL_TABLET | Freq: Every day | ORAL | 0 refills | Status: DC
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
## 2024-03-27 ENCOUNTER — Encounter: Payer: Self-pay | Admitting: Oncology

## 2024-03-27 ENCOUNTER — Encounter: Payer: Self-pay | Admitting: Psychiatry

## 2024-03-27 ENCOUNTER — Ambulatory Visit (INDEPENDENT_AMBULATORY_CARE_PROVIDER_SITE_OTHER): Admitting: Psychiatry

## 2024-03-27 ENCOUNTER — Other Ambulatory Visit: Payer: Self-pay

## 2024-03-27 VITALS — BP 106/74 | HR 102 | Temp 97.9°F | Ht 62.0 in | Wt 168.4 lb

## 2024-03-27 DIAGNOSIS — F251 Schizoaffective disorder, depressive type: Secondary | ICD-10-CM

## 2024-03-27 DIAGNOSIS — F603 Borderline personality disorder: Secondary | ICD-10-CM

## 2024-03-27 MED ORDER — ARIPIPRAZOLE 5 MG PO TABS
5.0000 mg | ORAL_TABLET | Freq: Every day | ORAL | 0 refills | Status: DC
  Filled 2024-03-27: qty 30, 30d supply, fill #0

## 2024-03-27 NOTE — Progress Notes (Signed)
 [facility] MD/PA/NP OP Progress Note  03/27/2024 9:24 AM Michelle Kelley  MRN:  [id number]  Chief Complaint:  Chief Complaint  Patient presents with   Follow-up   HPI: 38 year old female presenting [facility] for follow-up.  Patient reports that she had trouble with [facility] and filling her prescription and states she has not started on the medication.  Patient continues to share that she was feeling a little anxiety last week but states that this is due to the fact that she has been hospitalized so many times and that she is feeling more comfortable with this provider.  Patient shared that she is satisfied and happy that she was able to find a provider like myself and states that she is looking forward to working together and finding resolve to her stressors as well as her medication management.  Patient reports that she is busy at home taking care of her kids stating that she has 2 of her own and as well as to additional kids that live with her in which she is always providing care for her and is always out of energy but in the day.  Patient also states that she is in agreement with treatment plan stating that she knows that she is here to try to get help in managing her schizoaffective disorder.  Patient has had Abilify 5 mg filled and has been sent to the pharmacy and [facility].  Patient also recommended to try magnesium  glycinate to try to improve sleep taking 200 to 400 mg once daily.  Prescription has been sent to [facility] community pharmacy to get filled.  Patient is in agreement with treatment plan.  Patient with no other questions or concerns.  Patient to follow-up in 2 weeks.  Patient denies SI but endorses voices but states they are tolerable and are not commanding.  Patient also endorses visual hallucinations but states that they have not been that severe this week. Visit Diagnosis:    ICD-10-CM   1. Schizoaffective disorder, depressive type (HCC)  F25.1     2. Borderline personality disorder (HCC)  F60.3        Past Psychiatric History:  Previous Psych Hospitalizations: -9y, [place], inpatient for 6months. - Frequent hospitazization during middle school 7-8 months out of the school year.  - High school, 6 months out of the school year.  Outpatient treatment:  - No management since 19.  Medications Current: - Abilify 5mg  once daily Next Steps: - Monitor anxiety and positive symptoms.  Medication Trials: - Depakote, side effects - Geodan, side effects - Benzodiazepines, side effects - Seroquel, side effects.  Suicide & Violence: - Multiple attempts, Siince age of 6 with OD Substance Use: - Chronic marijuana use.  Psychotherapy: - Placed on [facility] waitlist for therapist.  Legal:  - Denies  Past Medical History:  Past Medical History:  Diagnosis Date   Anxiety    Asthma    Complication of anesthesia    I have a hard time coming out of general anesthesia, if I come out of it too fast, I get violent, I have to be eased out of it.   COPD (chronic obstructive pulmonary disease) (HCC)    HSV-2 (herpes simplex virus 2) infection    Hypertension    no meds   Iron  deficiency anemia 05/19/2021   Manic depression (HCC)    Menorrhagia    Migraine    Postoperative anemia due to acute blood loss 11/29/2015   Due to blood loss due to cesarean delivery  PTSD (post-traumatic stress disorder)    Schizophrenia, schizo-affective (HCC)    Vaginal Pap smear, abnormal     Past Surgical History:  Procedure Laterality Date   ABDOMINAL SURGERY     CARPAL TUNNEL RELEASE     CESAREAN SECTION     CESAREAN SECTION N/A 11/26/2015   Procedure: CESAREAN SECTION;  Surgeon: Mitzie BROCKS Ward, MD;  Location: [facility] ORS;  Service: Obstetrics;  Laterality: N/A;   CESAREAN SECTION WITH BILATERAL TUBAL LIGATION Bilateral 06/09/2021   Procedure: CESAREAN SECTION WITH BILATERAL TUBAL LIGATION;  Surgeon: Lake Read, MD;  Location: [facility] ORS;  Service: Obstetrics;  Laterality: Bilateral;   cosmetic  repairs     left arm, right knee and back.   LAPAROSCOPIC GASTRIC SLEEVE RESECTION     PANNICULECTOMY     TONSILLECTOMY      Family Psychiatric History: No additional  Family History:  Family History  Problem Relation Age of Onset   Cancer Mother    Hypertension Mother    Heart disease Mother    Diabetes Mother    Lung disease Mother    Clotting disorder Mother    Bipolar disorder Mother    Mental illness Sister        SCHIZOPHRENIA    Social History:  Social History   Socioeconomic History   Marital status: Media planner    Spouse name: Not on file   Number of children: Not on file   Years of education: Not on file   Highest education level: Not on file  Occupational History   Not on file  Tobacco Use   Smoking status: Every Day    Current packs/day: 0.50    Types: Cigarettes, E-cigarettes   Smokeless tobacco: Never  Vaping Use   Vaping status: Never Used  Substance and Sexual Activity   Alcohol use: Not Currently    Comment: last use years ago   Drug use: Yes    Types: Marijuana    Comment: last MJ today   Sexual activity: Yes    Partners: Male, Female    Birth control/protection: Surgical  Other Topics Concern   Not on file  Social History Narrative   Not on file   Social Drivers of Health   Financial Resource Strain: Low Risk  (02/16/2024)   Overall Financial Resource Strain (CARDIA)    Difficulty of Paying Living Expenses: Not very hard  Food Insecurity: No Food Insecurity (02/16/2024)   Hunger Vital Sign    Worried About Running Out of Food in the Last Year: Never true    Ran Out of Food in the Last Year: Never true  Transportation Needs: No Transportation Needs (02/16/2024)   PRAPARE - Administrator, Civil Service (Medical): No    Lack of Transportation (Non-Medical): No  Physical Activity: Sufficiently Active (02/16/2024)   Exercise Vital Sign    Days of Exercise per Week: 5 days    Minutes of Exercise per Session: 60 min   Stress: Stress Concern Present (02/16/2024)   [employer] of Occupational Health - Occupational Stress Questionnaire    Feeling of Stress: Very much  Social Connections: Not on file    Allergies:  Allergies  Allergen Reactions   Petroleum Distillate Rash   Amitriptyline     Heavy sedation    Nsaids     No oral NSAIDS   Sulfa Antibiotics     Pt can not recall reaction    Latex Rash    Metabolic Disorder Labs: Lab  Results  Component Value Date   HGBA1C 5.0 01/05/2024   MPG 96.8 01/05/2024   No results found for: PROLACTIN Lab Results  Component Value Date   CHOL 166 02/16/2024   TRIG 61 02/16/2024   HDL CANCELED 02/16/2024   CHOLHDL CANCELED 02/16/2024   VLDL 42 (H) 01/05/2024   LDLCALC CANCELED 02/16/2024   LDLCALC 174 (H) 01/05/2024   Lab Results  Component Value Date   TSH 0.698 01/05/2024    Therapeutic Level Labs: No results found for: LITHIUM No results found for: VALPROATE No results found for: CBMZ  Current Medications:   Musculoskeletal: Strength & Muscle Tone: within normal limits Gait & Station: normal Patient leans: N/A   Psychiatric Specialty Exam: Review of Systems  Blood pressure 106/74, pulse (!) 102, temperature 97.9 F (36.6 C), temperature source Temporal, height 5' 2 (1.575 m), weight 168 lb 6.4 oz (76.4 kg), last menstrual period 03/11/2024.Body mass index is 30.8 kg/m.  General Appearance: Well Groomed  Eye Contact:  Good  Speech:  Clear and Coherent  Volume:  Normal  Mood:  Anxious and Depressed  Affect:  Appropriate  Thought Process:  Coherent  Orientation:  Full (Time, Place, and Person)  Thought Content:  Hallucinations: Auditory Olfactory Visual  Suicidal Thoughts:  Yes.  without intent/plan  Homicidal Thoughts:  No  Memory:  Immediate;   Good Recent;   Good Remote;   Good  Judgement:  Fair  Insight:  Fair  Psychomotor Activity:  Normal  Concentration:  Concentration: Poor and Attention Span: Poor   Recall:  Good  Fund of Knowledge:Good  Language: Good  Akathisia:  No  Handed:  Right  AIMS (if indicated):    Assets:  Desire for Improvement Financial Resources/Insurance Housing  ADL's:  Intact  Cognition: WNL  Sleep:  Poor   Screenings: GAD-7    Flowsheet Row Office Visit from 03/18/2024 in [facility] Office Visit from 02/16/2024 in [facility] Office Visit from 12/28/2023 in [facility]  Total GAD-7 Score 13 15 17    PHQ2-9    Flowsheet Row Office Visit from 03/18/2024 in [facility] Office Visit from 02/16/2024 in [facility] Office Visit from 12/28/2023 in [facility] Clinical Support from 12/16/2020 in [facility] Office Visit from 02/25/2020 in [facility] Department  PHQ-2 Total Score 3 3 3  0 3  PHQ-9 Total Score 17 15 19  -- 8   Flowsheet Row ED from 03/18/2024 in [facility] Emergency Department at [facility] ED to Hosp-Admission (Discharged) from 06/09/2021 in [facility]  C-SSRS RISK CATEGORY No Risk No Risk     Assessment and Plan:  Assessment - Diagnosis: Schizoaffective disorder, depressive type (HCC) [F25.1]  2. Borderline personality disorder (HCC) [F60.3]   - Risk Factors: Worsening symptoms, hospitalization, suicidal risk  Plan - Medications:  Start Abilify 5mg  once daily.  Recommended to take Magnesium  glycinate, 200mg  - 400mg  once daily.  - Psychotherapy: Placed on waitlist for Almarie Ligas - Education: Patient has been educated on how to reach this provider by [employer] or by calling the clinic.  Patient educated on medications, dosage, purpose, side effects, adverse reactions. - Follow-Up: Patient to follow-up in 1 week, will be followed weekly. - Referrals: Patient placed on wait list for referral for therapy.   [facility] - Safety Planning:  The patient has been educated, if they should  have suicidal thoughts with or without a plan to call 911, or go to the closest emergency department.  Pt verbalized understanding.  Pt denies firearms within the home.  Pt also agrees to call the clinic should they have worsening symptoms before the next appointment.    Patient/Guardian was advised Release of Information must be obtained prior to any record release in order to collaborate their care with an outside provider. Patient/Guardian was advised if they have not already done so to contact the registration department to sign all necessary forms in order for us  to release information regarding their care.   Consent: Patient/Guardian gives verbal consent for treatment and assignment of benefits for services provided during this visit. Patient/Guardian expressed understanding and agreed to proceed.    Dorn Jama Der, NP 03/27/2024, 9:24 AM

## 2024-03-29 ENCOUNTER — Telehealth: Payer: Self-pay | Admitting: Physician Assistant

## 2024-03-29 NOTE — Telephone Encounter (Signed)
 Patient is wanting to know when she needs to come back for an office visit.  No followup was made at last visit.

## 2024-03-29 NOTE — Telephone Encounter (Signed)
 Spoke with pt and advise per note on 03/18/24 states no follow up required. I advised pt will sent provider message to make sure and if she recommends will call back to set up an appt. Pt verbalized understanding.

## 2024-04-02 ENCOUNTER — Other Ambulatory Visit: Payer: Self-pay

## 2024-04-02 ENCOUNTER — Encounter: Payer: Self-pay | Admitting: Psychiatry

## 2024-04-02 ENCOUNTER — Ambulatory Visit (INDEPENDENT_AMBULATORY_CARE_PROVIDER_SITE_OTHER): Admitting: Psychiatry

## 2024-04-02 ENCOUNTER — Encounter: Payer: Self-pay | Admitting: Oncology

## 2024-04-02 VITALS — BP 141/94 | HR 88 | Temp 97.8°F | Ht 62.0 in | Wt 169.0 lb

## 2024-04-02 DIAGNOSIS — F603 Borderline personality disorder: Secondary | ICD-10-CM | POA: Diagnosis not present

## 2024-04-02 DIAGNOSIS — F251 Schizoaffective disorder, depressive type: Secondary | ICD-10-CM

## 2024-04-02 MED ORDER — ARIPIPRAZOLE 10 MG PO TABS
10.0000 mg | ORAL_TABLET | Freq: Every day | ORAL | 0 refills | Status: DC
  Filled 2024-04-02: qty 30, 30d supply, fill #0

## 2024-04-02 NOTE — Progress Notes (Signed)
 BH MD/PA/NP OP Progress Note  04/02/2024 8:10 AM Michelle Kelley  MRN:  [id number]  Chief Complaint:  Chief Complaint  Patient presents with   Follow-up   HPI: 38 year old female presenting [facility] for follow-up.  Patient reports she finally started medication as she was able to get the medication from the community pharmacy at [facility] and states that she is actually switching all of her prescriptions here of because of these and how stress-free it was.  Patient reports a good weeks reporting that she has slept better since starting Abilify at 5 mg and reports that she feels that she could benefit more from the medication.  Patient reports that a lot of the negative hallucinations have stopped as well as the the voices she was hearing stating that as of now she all she is hearing is Yep Yep.  Patient reports family life is going well stating that she is able to keep her head together and helping support her children as well as taking care of her 2 older children that live in the household.  Patient reports family life is good as well as stating that she is feeling like her symptoms are improving as she is able to feel the benefit in her day-to-day.  Patient is currently satisfied with current medication regimen and would like to try more Abilify.  Based on this assessment interview is recommended for the patient to increase Abilify to 10 mg once daily and continue magnesium  glycinate 200 to 400 mg recommended at night.  Patient with no other questions or concerns.  Patient denies SI, HI, visual hallucinations.  Patient endorses auditory hallucinations but states no commanding or negative voices.  Patient to follow-up in 1 week. Visit Diagnosis:    ICD-10-CM   1. Schizoaffective disorder, depressive type (HCC)  F25.1     2. Borderline personality disorder (HCC)  F60.3       Past Psychiatric History:  Previous Psych Hospitalizations: -9y, [place], inpatient for 6months. - Frequent  hospitazization during middle school 7-8 months out of the school year.  - High school, 6 months out of the school year.  Outpatient treatment:  - No management since 19.  Medications Current: - Abilify 10mg  once daily - Recommended to Magnesium  glycinate 200-400mg  Next Steps: - Monitor anxiety and positive symptoms.  Medication Trials: - Depakote, side effects - Geodan, side effects - Benzodiazepines, side effects - Seroquel, side effects.  Suicide & Violence: - Multiple attempts, Siince age of 6 with OD Substance Use: - Chronic marijuana use.  Psychotherapy: - Placed on [facility] waitlist for therapist.  Legal:  - Denies    Past Medical History:  Past Medical History:  Diagnosis Date   Anxiety    Asthma    Complication of anesthesia    I have a hard time coming out of general anesthesia, if I come out of it too fast, I get violent, I have to be eased out of it.   COPD (chronic obstructive pulmonary disease) (HCC)    HSV-2 (herpes simplex virus 2) infection    Hypertension    no meds   Iron  deficiency anemia 05/19/2021   Manic depression (HCC)    Menorrhagia    Migraine    Postoperative anemia due to acute blood loss 11/29/2015   Due to blood loss due to cesarean delivery    PTSD (post-traumatic stress disorder)    Schizophrenia, schizo-affective (HCC)    Vaginal Pap smear, abnormal     Past Surgical History:  Procedure Laterality Date   ABDOMINAL SURGERY     CARPAL TUNNEL RELEASE     CESAREAN SECTION     CESAREAN SECTION N/A 11/26/2015   Procedure: CESAREAN SECTION;  Surgeon: Mitzie BROCKS Ward, MD;  Location: [facility] ORS;  Service: Obstetrics;  Laterality: N/A;   CESAREAN SECTION WITH BILATERAL TUBAL LIGATION Bilateral 06/09/2021   Procedure: CESAREAN SECTION WITH BILATERAL TUBAL LIGATION;  Surgeon: Lake Read, MD;  Location: [facility] ORS;  Service: Obstetrics;  Laterality: Bilateral;   cosmetic repairs     left arm, right knee and back.   LAPAROSCOPIC GASTRIC SLEEVE  RESECTION     PANNICULECTOMY     TONSILLECTOMY      Family Psychiatric History: No additional  Family History:  Family History  Problem Relation Age of Onset   Cancer Mother    Hypertension Mother    Heart disease Mother    Diabetes Mother    Lung disease Mother    Clotting disorder Mother    Bipolar disorder Mother    Mental illness Sister        SCHIZOPHRENIA    Social History:  Social History   Socioeconomic History   Marital status: Media planner    Spouse name: Not on file   Number of children: Not on file   Years of education: Not on file   Highest education level: Not on file  Occupational History   Not on file  Tobacco Use   Smoking status: Every Day    Current packs/day: 0.50    Types: Cigarettes, E-cigarettes   Smokeless tobacco: Never  Vaping Use   Vaping status: Never Used  Substance and Sexual Activity   Alcohol use: Not Currently    Comment: last use years ago   Drug use: Yes    Types: Marijuana    Comment: last MJ today   Sexual activity: Yes    Partners: Male, Female    Birth control/protection: Surgical  Other Topics Concern   Not on file  Social History Narrative   Not on file   Social Drivers of Health   Financial Resource Strain: Low Risk  (02/16/2024)   Overall Financial Resource Strain (CARDIA)    Difficulty of Paying Living Expenses: Not very hard  Food Insecurity: No Food Insecurity (02/16/2024)   Hunger Vital Sign    Worried About Running Out of Food in the Last Year: Never true    Ran Out of Food in the Last Year: Never true  Transportation Needs: No Transportation Needs (02/16/2024)   PRAPARE - Administrator, Civil Service (Medical): No    Lack of Transportation (Non-Medical): No  Physical Activity: Sufficiently Active (02/16/2024)   Exercise Vital Sign    Days of Exercise per Week: 5 days    Minutes of Exercise per Session: 60 min  Stress: Stress Concern Present (02/16/2024)   [employer] of  Occupational Health - Occupational Stress Questionnaire    Feeling of Stress: Very much  Social Connections: Not on file    Allergies:  Allergies  Allergen Reactions   Petroleum Distillate Rash   Amitriptyline     Heavy sedation    Nsaids     No oral NSAIDS   Sulfa Antibiotics     Pt can not recall reaction    Latex Rash    Metabolic Disorder Labs: Lab Results  Component Value Date   HGBA1C 5.0 01/05/2024   MPG 96.8 01/05/2024   No results found for: PROLACTIN Lab Results  Component Value Date   CHOL 166 02/16/2024   TRIG 61 02/16/2024   HDL CANCELED 02/16/2024   CHOLHDL CANCELED 02/16/2024   VLDL 42 (H) 01/05/2024   LDLCALC CANCELED 02/16/2024   LDLCALC 174 (H) 01/05/2024   Lab Results  Component Value Date   TSH 0.698 01/05/2024    Therapeutic Level Labs: No results found for: LITHIUM No results found for: VALPROATE No results found for: CBMZ  Current Medications:   Blood pressure (!) 141/94, pulse 88, temperature 97.8 F (36.6 C), temperature source Temporal, height 5' 2 (1.575 m), weight 169 lb (76.7 kg), last menstrual period 03/11/2024.  Musculoskeletal: Strength & Muscle Tone: within normal limits Gait & Station: normal Patient leans: N/A   Psychiatric Specialty Exam: Review of Systems  Blood pressure (!) 141/94, pulse 88, temperature 97.8 F (36.6 C), temperature source Temporal, height 5' 2 (1.575 m), weight 169 lb (76.7 kg), last menstrual period 03/11/2024.   General Appearance: Well Groomed  Eye Contact:  Good  Speech:  Clear and Coherent  Volume:  Normal  Mood:  Anxious and Depressed  Affect:  Appropriate  Thought Process:  Coherent  Orientation:  Full (Time, Place, and Person)  Thought Content:  Hallucinations: Auditory Olfactory Visual  Suicidal Thoughts:  Yes.  without intent/plan  Homicidal Thoughts:  No  Memory:  Immediate;   Good Recent;   Good Remote;   Good  Judgement:  Fair  Insight:  Fair  Psychomotor  Activity:  Normal  Concentration:  Concentration: Poor and Attention Span: Poor  Recall:  Good  Fund of Knowledge:Good  Language: Good  Akathisia:  No  Handed:  Right  AIMS (if indicated):    Assets:  Desire for Improvement Financial Resources/Insurance Housing  ADL's:  Intact  Cognition: WNL  Sleep:  Poor   Screenings: GAD-7    Flowsheet Row Office Visit from 03/18/2024 in [facility] Office Visit from 02/16/2024 in [facility] Office Visit from 12/28/2023 in [facility]  Total GAD-7 Score 13 15 17    PHQ2-9    Flowsheet Row Office Visit from 03/18/2024 in [facility] Office Visit from 02/16/2024 in [facility] Office Visit from 12/28/2023 in [facility] Clinical Support from 12/16/2020 in [facility] Office Visit from 02/25/2020 in [facility]  PHQ-2 Total Score 3 3 3  0 3  PHQ-9 Total Score 17 15 19  -- 8   Flowsheet Row ED from 03/18/2024 in [facility] Emergency Department at [facility] ED to Hosp-Admission (Discharged) from 06/09/2021 in [facility]  C-SSRS RISK CATEGORY No Risk No Risk     Assessment and Plan:  Assessment - Diagnosis: Schizoaffective disorder, depressive type (HCC) [F25.1]  2. Borderline personality disorder (HCC) [F60.3]   - Risk Factors: Worsening symptoms, hospitalization, suicidal risk  Plan - Medications:  Start Abilify 10mg  once daily.  Recommended to take Magnesium  glycinate, 200mg  - 400mg  once daily.  - Psychotherapy: Placed on waitlist for Almarie Ligas - Education: Patient has been educated on how to reach this provider by [employer] or by calling the clinic.  Patient educated on medications, dosage, purpose, side effects, adverse reactions. - Follow-Up: Patient to follow-up in 1 week, will be  followed weekly. - Referrals: Patient placed on wait list for referral for therapy.  ARPA - Safety Planning:  The patient has been educated, if they should have suicidal  thoughts with or without a plan to call 911, or go to the closest emergency department.  Pt verbalized understanding.  Pt denies firearms within the home.  Pt also agrees to call the clinic should they have worsening symptoms before the next appointment.    Patient/Guardian was advised Release of Information must be obtained prior to any record release in order to collaborate their care with an outside provider. Patient/Guardian was advised if they have not already done so to contact the registration department to sign all necessary forms in order for us  to release information regarding their care.   Consent: Patient/Guardian gives verbal consent for treatment and assignment of benefits for services provided during this visit. Patient/Guardian expressed understanding and agreed to proceed.    Dorn Jama Der, NP 04/02/2024, 8:10 AM

## 2024-04-03 ENCOUNTER — Other Ambulatory Visit: Payer: Self-pay | Admitting: Physician Assistant

## 2024-04-03 DIAGNOSIS — I10 Essential (primary) hypertension: Secondary | ICD-10-CM

## 2024-04-03 NOTE — Telephone Encounter (Signed)
 Spoke with pt, pt only wanted to schedule an appt for her Flu vaccine. Pt has been scheduled for tomorrow.

## 2024-04-04 ENCOUNTER — Ambulatory Visit

## 2024-04-04 DIAGNOSIS — Z23 Encounter for immunization: Secondary | ICD-10-CM

## 2024-04-04 NOTE — Progress Notes (Signed)
 Patient came in for a flu shot in right deltoid with 25g x 1 inch needle.  Tolerated the vaccine very well.

## 2024-04-09 ENCOUNTER — Encounter: Payer: Self-pay | Admitting: Psychiatry

## 2024-04-09 ENCOUNTER — Ambulatory Visit (INDEPENDENT_AMBULATORY_CARE_PROVIDER_SITE_OTHER): Admitting: Psychiatry

## 2024-04-09 ENCOUNTER — Other Ambulatory Visit: Payer: Self-pay

## 2024-04-09 ENCOUNTER — Ambulatory Visit: Admitting: Certified Nurse Midwife

## 2024-04-09 VITALS — BP 133/88 | HR 72 | Temp 97.7°F | Ht 62.0 in | Wt 170.8 lb

## 2024-04-09 DIAGNOSIS — F251 Schizoaffective disorder, depressive type: Secondary | ICD-10-CM | POA: Diagnosis not present

## 2024-04-09 DIAGNOSIS — F603 Borderline personality disorder: Secondary | ICD-10-CM | POA: Diagnosis not present

## 2024-04-09 NOTE — Progress Notes (Signed)
 BH MD/PA/NP OP Progress Note  04/09/2024 8:38 AM Michelle Kelley  MRN:  [id number]  Chief Complaint:  Chief Complaint  Patient presents with   Follow-up   HPI: 38 year old female presenting [facility] for follow-up.  Patient reports today that she is feeling much better and states that the world is quieter.  Patient reports that the medication of Abilify going up to 10 mg has helped her quite a bit and feeling more of herself and feeling more human.  Patient reports no auditory or visual hallucinations at this time.  Patient also states that she is not feeling as anxious anymore and states that she is still finding some ADHD-like behaviors in which she is becoming more OCD like according to her her family regarding giving more energy towards try to keep things in order as well as organize at the home.  Patient reports that currently at home family is keeping her busy as she is getting ready for her oldest daughter to go to college.  Patient also reports that she has found out that the roof leak that she has will not be covered by house at home insurance and states that she is okay with it though because she has a savings to be able to take care of it.  Patient is grateful currently with the current living situation she is in and grateful that she is feeling much better to be able to meet her family needs as well as herself.  Patient has been encouraged to search for hobbies and activities to be able to enjoy that being satisfaction and joy.  Patient reports all in all no significant side effects based on the medication profile reporting that she is doing well stating no abnormal movements.  Patient reports satisfaction with the current medication regimen.  Patient will continue to current medication regimen.  Patient is in agreement with treatment plan.  Patient with no other questions or concerns.  Patient denies SI, HI, AVH.  Patient to follow-up in 1 week. Visit Diagnosis:    ICD-10-CM   1.  Schizoaffective disorder, depressive type (HCC)  F25.1     2. Borderline personality disorder (HCC)  F60.3       Past Psychiatric History:  Previous Psych Hospitalizations: -9y, [place], inpatient for 6months. - Frequent hospitazization during middle school 7-8 months out of the school year.  - High school, 6 months out of the school year.  Outpatient treatment:  - No management since 19.  Medications Current: - Abilify 10mg  once daily - Recommended to Magnesium  glycinate 200-400mg  Next Steps: - Monitor anxiety and positive symptoms.  Medication Trials: - Depakote, side effects - Geodan, side effects - Benzodiazepines, side effects - Seroquel, side effects.  Suicide & Violence: - Multiple attempts, Siince age of 6 with OD Substance Use: - Chronic marijuana use.  Psychotherapy: - Placed on [facility] waitlist for therapist.  Legal:  - Denies  Past Medical History:  Past Medical History:  Diagnosis Date   Anxiety    Asthma    Complication of anesthesia    I have a hard time coming out of general anesthesia, if I come out of it too fast, I get violent, I have to be eased out of it.   COPD (chronic obstructive pulmonary disease) (HCC)    HSV-2 (herpes simplex virus 2) infection    Hypertension    no meds   Iron  deficiency anemia 05/19/2021   Manic depression (HCC)    Menorrhagia    Migraine  Postoperative anemia due to acute blood loss 11/29/2015   Due to blood loss due to cesarean delivery    PTSD (post-traumatic stress disorder)    Schizophrenia, schizo-affective (HCC)    Vaginal Pap smear, abnormal     Past Surgical History:  Procedure Laterality Date   ABDOMINAL SURGERY     CARPAL TUNNEL RELEASE     CESAREAN SECTION     CESAREAN SECTION N/A 11/26/2015   Procedure: CESAREAN SECTION;  Surgeon: Mitzie BROCKS Ward, MD;  Location: [facility] ORS;  Service: Obstetrics;  Laterality: N/A;   CESAREAN SECTION WITH BILATERAL TUBAL LIGATION Bilateral 06/09/2021   Procedure:  CESAREAN SECTION WITH BILATERAL TUBAL LIGATION;  Surgeon: Lake Read, MD;  Location: [facility] ORS;  Service: Obstetrics;  Laterality: Bilateral;   cosmetic repairs     left arm, right knee and back.   LAPAROSCOPIC GASTRIC SLEEVE RESECTION     PANNICULECTOMY     TONSILLECTOMY      Family Psychiatric History: No additional  Family History:  Family History  Problem Relation Age of Onset   Cancer Mother    Hypertension Mother    Heart disease Mother    Diabetes Mother    Lung disease Mother    Clotting disorder Mother    Bipolar disorder Mother    Mental illness Sister        SCHIZOPHRENIA    Social History:  Social History   Socioeconomic History   Marital status: Media planner    Spouse name: Not on file   Number of children: Not on file   Years of education: Not on file   Highest education level: Not on file  Occupational History   Not on file  Tobacco Use   Smoking status: Every Day    Current packs/day: 0.50    Types: Cigarettes, E-cigarettes   Smokeless tobacco: Never  Vaping Use   Vaping status: Never Used  Substance and Sexual Activity   Alcohol use: Not Currently    Comment: last use years ago   Drug use: Yes    Types: Marijuana    Comment: last MJ today   Sexual activity: Yes    Partners: Male, Female    Birth control/protection: Surgical  Other Topics Concern   Not on file  Social History Narrative   Not on file   Social Drivers of Health   Financial Resource Strain: Low Risk  (02/16/2024)   Overall Financial Resource Strain (CARDIA)    Difficulty of Paying Living Expenses: Not very hard  Food Insecurity: No Food Insecurity (02/16/2024)   Hunger Vital Sign    Worried About Running Out of Food in the Last Year: Never true    Ran Out of Food in the Last Year: Never true  Transportation Needs: No Transportation Needs (02/16/2024)   PRAPARE - Administrator, Civil Service (Medical): No    Lack of Transportation (Non-Medical): No   Physical Activity: Sufficiently Active (02/16/2024)   Exercise Vital Sign    Days of Exercise per Week: 5 days    Minutes of Exercise per Session: 60 min  Stress: Stress Concern Present (02/16/2024)   [employer] of Occupational Health - Occupational Stress Questionnaire    Feeling of Stress: Very much  Social Connections: Not on file    Allergies:  Allergies  Allergen Reactions   Petroleum Distillate Rash   Amitriptyline     Heavy sedation    Nsaids     No oral NSAIDS   Sulfa Antibiotics  Pt can not recall reaction    Latex Rash    Metabolic Disorder Labs: Lab Results  Component Value Date   HGBA1C 5.0 01/05/2024   MPG 96.8 01/05/2024   No results found for: PROLACTIN Lab Results  Component Value Date   CHOL 166 02/16/2024   TRIG 61 02/16/2024   HDL CANCELED 02/16/2024   CHOLHDL CANCELED 02/16/2024   VLDL 42 (H) 01/05/2024   LDLCALC CANCELED 02/16/2024   LDLCALC 174 (H) 01/05/2024   Lab Results  Component Value Date   TSH 0.698 01/05/2024    Therapeutic Level Labs: No results found for: LITHIUM No results found for: VALPROATE No results found for: CBMZ  Current Medications: Current Outpatient Medications  Medication Sig Dispense Refill   acetaminophen  (TYLENOL ) 500 MG tablet Take 2 tablets (1,000 mg total) by mouth every 6 (six) hours as needed for fever or headache. 60 tablet 0   albuterol  (VENTOLIN  HFA) 108 (90 Base) MCG/ACT inhaler Inhale 2 puffs into the lungs every 6 (six) hours as needed for wheezing or shortness of breath. 18 g 0   amLODipine  (NORVASC ) 5 MG tablet TAKE 1 TABLET BY MOUTH ONCE DAILY. DO NOT TAKE THIS MEDICATION IF PREGNANT. 90 tablet 1   ARIPiprazole (ABILIFY) 10 MG tablet Take 1 tablet (10 mg total) by mouth daily. 30 tablet 0   atorvastatin  (LIPITOR) 10 MG tablet Take 1 tablet (10 mg total) by mouth daily. 90 tablet 0   Blood Pressure Monitoring (BLOOD PRESSURE KIT) DEVI Any brand available, measure BP daily 1 each 0    dicyclomine (BENTYL) 20 MG tablet Take 1 tablet (20 mg total) by mouth 2 (two) times daily. 20 tablet 0   levocetirizine (XYZAL ) 5 MG tablet Take 1 tablet (5 mg total) by mouth every evening. 30 tablet 2   lisinopril  (ZESTRIL ) 2.5 MG tablet TAKE 1 TABLET BY MOUTH ONCE DAILY. DO NOT TAKE THIS MEDICATION IF PREGNANT. 90 tablet 1   norethindrone  (MICRONOR ) 0.35 MG tablet Take 1 tablet (0.35 mg total) by mouth daily. 84 tablet 3   ondansetron  (ZOFRAN -ODT) 4 MG disintegrating tablet Take 1 tablet (4 mg total) by mouth every 8 (eight) hours as needed for nausea or vomiting. 20 tablet 0   Vitamin D , Ergocalciferol , (DRISDOL ) 1.25 MG (50000 UNIT) CAPS capsule Take 1 capsule (50,000 Units total) by mouth every 7 (seven) days. 8 capsule 0   No current facility-administered medications for this visit.    Musculoskeletal: Strength & Muscle Tone: within normal limits Gait & Station: normal Patient leans: N/A   Psychiatric Specialty Exam: Review of Systems  Blood pressure 133/88, pulse 72, temperature 97.7 F (36.5 C), temperature source Temporal, height 5' 2 (1.575 m), weight 170 lb 12.8 oz (77.5 kg), last menstrual period 03/11/2024.Body mass index is 31.24 kg/m.    General Appearance: Well Groomed  Eye Contact:  Good  Speech:  Clear and Coherent  Volume:  Normal  Mood:  Anxious and Depressed  Affect:  Appropriate  Thought Process:  Coherent  Orientation:  Full (Time, Place, and Person)  Thought Content:  Hallucinations: Auditory Olfactory Visual  Suicidal Thoughts:  Yes.  without intent/plan  Homicidal Thoughts:  No  Memory:  Immediate;   Good Recent;   Good Remote;   Good  Judgement:  Fair  Insight:  Fair  Psychomotor Activity:  Normal  Concentration:  Concentration: Poor and Attention Span: Poor  Recall:  Good  Fund of Knowledge:Good  Language: Good  Akathisia:  No  Handed:  Right  AIMS (if indicated):    Assets:  Desire for Improvement Financial  Resources/Insurance Housing  ADL's:  Intact  Cognition: WNL  Sleep:  Poor   Screenings: GAD-7    Flowsheet Row Office Visit from 03/18/2024 in [facility] Office Visit from 02/16/2024 in [facility] Office Visit from 12/28/2023 in [facility]  Total GAD-7 Score 13 15 17    PHQ2-9    Flowsheet Row Office Visit from 03/18/2024 in [facility] Office Visit from 02/16/2024 in [facility] Office Visit from 12/28/2023 in [facility] Clinical Support from 12/16/2020 in [facility] Department Office Visit from 02/25/2020 in [facility] Health Department  PHQ-2 Total Score 3 3 3  0 3  PHQ-9 Total Score 17 15 19  -- 8   Flowsheet Row ED from 03/18/2024 in [facility] Emergency Department at [facility] ED to Hosp-Admission (Discharged) from 06/09/2021 in [facility]  C-SSRS RISK CATEGORY No Risk No Risk     Assessment and Plan:  Assessment - Diagnosis: Schizoaffective disorder, depressive type (HCC) [F25.1]  2. Borderline personality disorder (HCC) [F60.3]   - Risk Factors: Worsening symptoms, hospitalization, suicidal risk  Plan - Medications:  Continue Abilify 10mg  once daily.  Recommended to take Magnesium  glycinate, 200mg  - 400mg  once daily.  - Psychotherapy: Placed on waitlist for Almarie Ligas - Education: Patient has been educated on how to reach this provider by [employer] or by calling the clinic.  Patient educated on medications, dosage, purpose, side effects, adverse reactions. - Follow-Up: Patient to follow-up in 1 week, will be followed weekly. - Referrals: Patient placed on wait list for referral for therapy.  ARPA - Safety Planning:  The patient has been educated, if they should have suicidal thoughts with or without a plan to call 911, or go to the closest  emergency department.  Pt verbalized understanding.  Pt denies firearms within the home.  Pt also agrees to call the clinic should they have worsening symptoms before the next appointment.     Patient/Guardian was advised Release of Information must be obtained prior to any record release in order to collaborate their care with an outside provider. Patient/Guardian was advised if they have not already done so to contact the registration department to sign all necessary forms in order for us  to release information regarding their care.   Consent: Patient/Guardian gives verbal consent for treatment and assignment of benefits for services provided during this visit. Patient/Guardian expressed understanding and agreed to proceed.    Dorn Jama Der, NP 04/09/2024, 8:38 AM

## 2024-04-18 ENCOUNTER — Other Ambulatory Visit: Payer: Self-pay | Admitting: Obstetrics

## 2024-04-18 ENCOUNTER — Other Ambulatory Visit: Payer: Self-pay | Admitting: Physician Assistant

## 2024-04-18 DIAGNOSIS — J3089 Other allergic rhinitis: Secondary | ICD-10-CM

## 2024-04-24 ENCOUNTER — Other Ambulatory Visit: Payer: Self-pay | Admitting: Physician Assistant

## 2024-04-29 ENCOUNTER — Ambulatory Visit: Admitting: Urology

## 2024-04-29 VITALS — BP 136/84 | HR 76 | Ht 62.0 in | Wt 175.0 lb

## 2024-04-29 DIAGNOSIS — N3946 Mixed incontinence: Secondary | ICD-10-CM

## 2024-04-29 DIAGNOSIS — R32 Unspecified urinary incontinence: Secondary | ICD-10-CM

## 2024-04-29 LAB — MICROSCOPIC EXAMINATION: Epithelial Cells (non renal): >10 /HPF — AB (ref 0–10)

## 2024-04-29 LAB — URINALYSIS, COMPLETE
Bilirubin, UA: NEGATIVE
Glucose, UA: NEGATIVE
Ketones, UA: NEGATIVE
Leukocytes,UA: NEGATIVE
Nitrite, UA: NEGATIVE
Protein,UA: NEGATIVE
Specific Gravity, UA: 1.01 (ref 1.005–1.030)
Urobilinogen, Ur: 0.2 mg/dL (ref 0.2–1.0)
pH, UA: 6 (ref 5.0–7.5)
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
## 2024-04-29 NOTE — Patient Instructions (Signed)

## 2024-05-01 ENCOUNTER — Telehealth (INDEPENDENT_AMBULATORY_CARE_PROVIDER_SITE_OTHER): Admitting: Psychiatry

## 2024-05-01 DIAGNOSIS — F603 Borderline personality disorder: Secondary | ICD-10-CM | POA: Diagnosis not present

## 2024-05-01 DIAGNOSIS — F251 Schizoaffective disorder, depressive type: Secondary | ICD-10-CM

## 2024-05-01 NOTE — Progress Notes (Signed)
 BH MD/PA/NP OP Progress Note  05/01/2024 4:01 PM TAGEN BRETHAUER  MRN:  [id number]  Chief Complaint: Routine Follow-up Virtual Visit via Video Note  I connected with Michelle Kelley on 05/01/24 at  4:00 PM EST by a video enabled telemedicine application and verified that I am speaking with the correct person using two identifiers.  Location: Patient: [address] Michelle Kelley  [place] [address]  Provider: [place] [facility] office of provider   I discussed the limitations of evaluation and management by telemedicine and the availability of in person appointments. The patient expressed understanding and agreed to proceed.    I discussed the assessment and treatment plan with the patient. The patient was provided an opportunity to ask questions and all were answered. The patient agreed with the plan and demonstrated an understanding of the instructions.   The patient was advised to call back or seek an in-person evaluation if the symptoms worsen or if the condition fails to improve as anticipated.  I provided 30 minutes of non-face-to-face time during this encounter.   Dorn Jama Der, NP   HPI: [facility] for follow-up.  Patient reports that she is doing well but states that she is having some stressors and is not sure if she is dysregulated.  Patient reports the inner voices have stopped and that she is not having any hallucinations whatsoever.  Patient reports that her daughter had to come back from college and that she and her dryer broke and that she is having other stressors day-to-day as well to patient has been reorientate and reminded that she is now getting used to being able to function with full clarity rather than the inner voice and that she was listening to with her schizophrenia in which she verbalized understanding and states that she will give it a couple more weeks on the current medication to determine whether or not she needs adjustment.  Based on this assessment interview is  recommended for the patient to continue medication regimen as prescribed.  Patient with no other questions or concerns at this time.  Patient will follow-up in 1 week.  Patient denies SI, HI, AVH. Visit Diagnosis:    ICD-10-CM   1. Schizoaffective disorder, depressive type (HCC)  F25.1     2. Borderline personality disorder (HCC)  F60.3       Past Psychiatric History:  Previous Psych Hospitalizations: -9y, [place], inpatient for 6months. - Frequent hospitazization during middle school 7-8 months out of the school year.  - High school, 6 months out of the school year.  Outpatient treatment:  - No management since 19.  Medications Current: - Abilify 10mg  once daily - Recommended to Magnesium  glycinate 200-400mg  Next Steps: - Monitor anxiety and positive symptoms.  Medication Trials: - Depakote, side effects - Geodan, side effects - Benzodiazepines, side effects - Seroquel, side effects.  Suicide & Violence: - Multiple attempts, Siince age of 6 with OD Substance Use: - Chronic marijuana use.  Psychotherapy: - Placed on [facility] waitlist for therapist.  Legal:  - Denies  Past Medical History:  Past Medical History:  Diagnosis Date   Anxiety    Asthma    Complication of anesthesia    I have a hard time coming out of general anesthesia, if I come out of it too fast, I get violent, I have to be eased out of it.   COPD (chronic obstructive pulmonary disease) (HCC)    HSV-2 (herpes simplex virus 2) infection    Hypertension  no meds   Iron  deficiency anemia 05/19/2021   Manic depression (HCC)    Menorrhagia    Migraine    Postoperative anemia due to acute blood loss 11/29/2015   Due to blood loss due to cesarean delivery    PTSD (post-traumatic stress disorder)    Schizophrenia, schizo-affective (HCC)    Vaginal Pap smear, abnormal     Past Surgical History:  Procedure Laterality Date   ABDOMINAL SURGERY     CARPAL TUNNEL RELEASE     CESAREAN SECTION     CESAREAN  SECTION N/A 11/26/2015   Procedure: CESAREAN SECTION;  Surgeon: Mitzie BROCKS Ward, MD;  Location: [facility] ORS;  Service: Obstetrics;  Laterality: N/A;   CESAREAN SECTION WITH BILATERAL TUBAL LIGATION Bilateral 06/09/2021   Procedure: CESAREAN SECTION WITH BILATERAL TUBAL LIGATION;  Surgeon: Lake Read, MD;  Location: [facility] OR[facility];  Service: Obstetrics;  Laterality: Bilateral;   cosmetic repairs     left arm, right knee and back.   LAPAROSCOPIC GASTRIC SLEEVE RESECTION     PANNICULECTOMY     TONSILLECTOMY      Family Psychiatric History: No additional  Family History:  Family History  Problem Relation Age of Onset   Cancer Mother    Hypertension Mother    Heart disease Mother    Diabetes Mother    Lung disease Mother    Clotting disorder Mother    Bipolar disorder Mother    Mental illness Sister        SCHIZOPHRENIA    Social History:  Social History   Socioeconomic History   Marital status: Media Planner    Spouse name: Not on file   Number of children: Not on file   Years of education: Not on file   Highest education level: Not on file  Occupational History   Not on file  Tobacco Use   Smoking status: Every Day    Current packs/day: 0.50    Types: Cigarettes, E-cigarettes   Smokeless tobacco: Never  Vaping Use   Vaping status: Never Used  Substance and Sexual Activity   Alcohol use: Not Currently    Comment: last use years ago   Drug use: Yes    Types: Marijuana    Comment: last MJ today   Sexual activity: Yes    Partners: Male, Female    Birth control/protection: Surgical  Other Topics Concern   Not on file  Social History Narrative   Not on file   Social Drivers of Health   Financial Resource Strain: Low Risk  (02/16/2024)   Overall Financial Resource Strain (CARDIA)    Difficulty of Paying Living Expenses: Not very hard  Food Insecurity: No Food Insecurity (02/16/2024)   Hunger Vital Sign    Worried About Running Out of Food in the Last Year: Never  true    Ran Out of Food in the Last Year: Never true  Transportation Needs: No Transportation Needs (02/16/2024)   PRAPARE - Administrator, Civil Service (Medical): No    Lack of Transportation (Non-Medical): No  Physical Activity: Sufficiently Active (02/16/2024)   Exercise Vital Sign    Days of Exercise per Week: 5 days    Minutes of Exercise per Session: 60 min  Stress: Stress Concern Present (02/16/2024)   [employer] of Occupational Health - Occupational Stress Questionnaire    Feeling of Stress: Very much  Social Connections: Not on file    Allergies:  Allergies  Allergen Reactions   Petroleum Distillate Rash  Amitriptyline     Heavy sedation    Nsaids     No oral NSAIDS   Sulfa Antibiotics     Pt can not recall reaction    Latex Rash    Metabolic Disorder Labs: Lab Results  Component Value Date   HGBA1C 5.0 01/05/2024   MPG 96.8 01/05/2024   No results found for: PROLACTIN Lab Results  Component Value Date   CHOL 166 02/16/2024   TRIG 61 02/16/2024   HDL CANCELED 02/16/2024   CHOLHDL CANCELED 02/16/2024   VLDL 42 (H) 01/05/2024   LDLCALC CANCELED 02/16/2024   LDLCALC 174 (H) 01/05/2024   Lab Results  Component Value Date   TSH 0.698 01/05/2024    Therapeutic Level Labs: No results found for: LITHIUM No results found for: VALPROATE No results found for: CBMZ  Current Medications: Current Outpatient Medications  Medication Sig Dispense Refill   albuterol  (VENTOLIN  HFA) 108 (90 Base) MCG/ACT inhaler Inhale 2 puffs into the lungs every 6 (Kelley) hours as needed for wheezing or shortness of breath. 18 g 0   amLODipine  (NORVASC ) 5 MG tablet TAKE 1 TABLET BY MOUTH ONCE DAILY. DO NOT TAKE THIS MEDICATION IF PREGNANT. 90 tablet 1   ARIPiprazole (ABILIFY) 10 MG tablet Take 1 tablet (10 mg total) by mouth daily. 30 tablet 0   atorvastatin  (LIPITOR) 10 MG tablet Take 1 tablet by mouth once daily 90 tablet 0   Blood Pressure Monitoring  (BLOOD PRESSURE KIT) DEVI Any brand available, measure BP daily 1 each 0   levocetirizine (XYZAL ) 5 MG tablet TAKE 1 TABLET BY MOUTH ONCE DAILY IN THE EVENING 90 tablet 1   lisinopril  (ZESTRIL ) 2.5 MG tablet TAKE 1 TABLET BY MOUTH ONCE DAILY. DO NOT TAKE THIS MEDICATION IF PREGNANT. 90 tablet 1   norethindrone  (MICRONOR ) 0.35 MG tablet Take 1 tablet (0.35 mg total) by mouth daily. 84 tablet 3   ondansetron  (ZOFRAN -ODT) 4 MG disintegrating tablet Take 1 tablet (4 mg total) by mouth every 8 (eight) hours as needed for nausea or vomiting. 20 tablet 0   No current facility-administered medications for this visit.     Musculoskeletal: Strength & Muscle Tone: within normal limits Gait & Station: normal Patient leans: N/A   Psychiatric Specialty Exam: Review of Systems  No vitals for virtual visit.  General Appearance: Well Groomed  Eye Contact:  Good  Speech:  Clear and Coherent  Volume:  Normal  Mood:  Anxious and Depressed  Affect:  Appropriate  Thought Process:  Coherent  Orientation:  Full (Time, Place, and Person)  Thought Content:  Hallucinations: Auditory Olfactory Visual  Suicidal Thoughts:  Yes.  without intent/plan  Homicidal Thoughts:  No  Memory:  Immediate;   Good Recent;   Good Remote;   Good  Judgement:  Fair  Insight:  Fair  Psychomotor Activity:  Normal  Concentration:  Concentration: Poor and Attention Span: Poor  Recall:  Good  Fund of Knowledge:Good  Language: Good  Akathisia:  No  Handed:  Right  AIMS (if indicated):    Assets:  Desire for Improvement Financial Resources/Insurance Housing  ADL's:  Intact  Cognition: WNL  Sleep:  Poor   Screenings: GAD-7    Flowsheet Row Office Visit from 03/18/2024 in [facility] Office Visit from 02/16/2024 in [facility] Office Visit from 12/28/2023 in [facility]  Total GAD-7 Score 13 15 17    [employer]    Flowsheet Row Office Visit  from 03/18/2024 in [facility] Office Visit from 02/16/2024 in [facility] Office Visit from 12/28/2023 in [facility] Clinical Support from 12/16/2020 in [facility] Department Office Visit from 02/25/2020 in [facility]  PHQ-2 Total Score 3 3 3  0 3  PHQ-9 Total Score 17 15 19  -- 8   Flowsheet Row ED from 03/18/2024 in [facility] Department at [facility] ED to Hosp-Admission (Discharged) from 06/09/2021 in [facility]  C-SSRS RISK CATEGORY No Risk No Risk     Assessment and Plan:  Assessment - Diagnosis: Schizoaffective disorder, depressive type (HCC) [F25.1]  2. Borderline personality disorder (HCC) [F60.3]   - Risk Factors: Worsening symptoms, hospitalization, suicidal risk  Plan - Medications:  Continue Abilify 10mg  once daily.  Recommended to take Magnesium  glycinate, 200mg  - 400mg  once daily.  - Psychotherapy: Placed on waitlist for Almarie Ligas - Education: Patient has been educated on how to reach this provider by [employer] or by calling the clinic.  Patient educated on medications, dosage, purpose, side effects, adverse reactions. - Follow-Up: Patient to follow-up in 1 week, will be followed weekly. - Referrals: Patient placed on wait list for referral for therapy.  [facility] - Safety Planning:  The patient has been educated, if they should have suicidal thoughts with or without a plan to call 911, or go to the closest emergency department.  Pt verbalized understanding.  Pt denies firearms within the home.  Pt also agrees to call the clinic should they have worsening symptoms before the next appointment.   Patient/Guardian was advised Release of Information must be obtained prior to any record release in order to collaborate their care with an outside provider. Patient/Guardian was advised if they have not already  done so to contact the registration department to sign all necessary forms in order for us  to release information regarding their care.   Consent: Patient/Guardian gives verbal consent for treatment and assignment of benefits for services provided during this visit. Patient/Guardian expressed understanding and agreed to proceed.    Dorn Jama Der, NP 05/01/2024, 4:01 PM

## 2024-05-07 ENCOUNTER — Encounter: Payer: Self-pay | Admitting: Psychiatry

## 2024-05-07 ENCOUNTER — Other Ambulatory Visit: Payer: Self-pay

## 2024-05-07 ENCOUNTER — Ambulatory Visit (INDEPENDENT_AMBULATORY_CARE_PROVIDER_SITE_OTHER): Admitting: Psychiatry

## 2024-05-07 ENCOUNTER — Ambulatory Visit

## 2024-05-07 VITALS — BP 123/88 | HR 84 | Temp 98.1°F | Ht 62.0 in | Wt 177.0 lb

## 2024-05-07 DIAGNOSIS — F203 Undifferentiated schizophrenia: Secondary | ICD-10-CM | POA: Diagnosis not present

## 2024-05-07 DIAGNOSIS — F603 Borderline personality disorder: Secondary | ICD-10-CM | POA: Diagnosis not present

## 2024-05-07 DIAGNOSIS — F251 Schizoaffective disorder, depressive type: Secondary | ICD-10-CM | POA: Diagnosis not present

## 2024-05-07 MED ORDER — ARIPIPRAZOLE ER 400 MG IM PRSY
400.0000 mg | PREFILLED_SYRINGE | Freq: Once | INTRAMUSCULAR | Status: AC
  Administered 2024-05-07: 400 mg via INTRAMUSCULAR

## 2024-05-07 MED ORDER — ABILIFY MAINTENA 400 MG IM PRSY: 400.0000 mg | PREFILLED_SYRINGE | INTRAMUSCULAR | Status: AC

## 2024-05-07 NOTE — Patient Instructions (Signed)
 Patient present flat affect mood was pleasant and denied visual or auditory hallucinations. No suicidal or homicidal ideations, no plan, intent, or means to want to harm self or others. Patients Abilify Maintena 400 mg IM injection prepared as ordered and administered to patient in her right deltoid. Patient tolerated without discomfort or pain. Patient will return in 28 days and will call if there is any changes.   LOT [id number] EXP 06-19-2025
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
## 2024-05-07 NOTE — Progress Notes (Unsigned)
 Patient present flat affect mood was pleasant and denied visual or auditory hallucinations. No suicidal or homicidal ideations, no plan, intent, or means to want to harm self or others. Patients Abilify Maintena 400 mg IM injection prepared as ordered and administered to patient in her right deltoid. Patient tolerated without discomfort or pain. Patient will return in 28 days and will call if there is any changes.   LOT [id number] EXP 06-19-2025

## 2024-05-09 ENCOUNTER — Other Ambulatory Visit: Payer: Self-pay

## 2024-05-09 ENCOUNTER — Encounter: Payer: Self-pay | Admitting: Oncology

## 2024-05-09 MED ORDER — FLUTICASONE PROPIONATE 50 MCG/ACT NA SUSP
1.0000 | Freq: Every day | NASAL | 3 refills | Status: AC
  Filled 2024-05-09: qty 16, 30d supply, fill #0

## 2024-05-09 MED ORDER — OLOPATADINE HCL 0.2 % OP SOLN
1.0000 [drp] | Freq: Every day | OPHTHALMIC | 3 refills | Status: AC
  Filled 2024-05-09: qty 2.5, 50d supply, fill #0

## 2024-05-09 MED ORDER — MOMETASONE FUROATE 0.1 % EX CREA
1.0000 | TOPICAL_CREAM | Freq: Two times a day (BID) | CUTANEOUS | 1 refills | Status: AC
  Filled 2024-05-09: qty 45, 30d supply, fill #0

## 2024-05-12 ENCOUNTER — Encounter: Payer: Self-pay | Admitting: Oncology

## 2024-05-12 ENCOUNTER — Other Ambulatory Visit: Payer: Self-pay

## 2024-05-14 ENCOUNTER — Encounter: Payer: Self-pay | Admitting: Oncology

## 2024-05-20 ENCOUNTER — Encounter: Payer: Self-pay | Admitting: Oncology

## 2024-05-21 ENCOUNTER — Other Ambulatory Visit: Payer: Self-pay

## 2024-05-21 ENCOUNTER — Encounter: Payer: Self-pay | Admitting: Psychiatry

## 2024-05-21 ENCOUNTER — Ambulatory Visit (INDEPENDENT_AMBULATORY_CARE_PROVIDER_SITE_OTHER): Payer: MEDICAID | Admitting: Psychiatry

## 2024-05-21 VITALS — BP 146/83 | HR 94 | Temp 98.1°F | Ht 62.0 in | Wt 179.6 lb

## 2024-05-21 DIAGNOSIS — F251 Schizoaffective disorder, depressive type: Secondary | ICD-10-CM

## 2024-05-21 DIAGNOSIS — F603 Borderline personality disorder: Secondary | ICD-10-CM

## 2024-05-21 NOTE — Progress Notes (Unsigned)
 [facility] MD/PA/NP OP Progress Note  05/21/2024 3:03 PM Michelle Kelley  MRN:  [id number]  Chief Complaint:  Chief Complaint  Patient presents with   Follow-up   HPI: 38 year old female presenting [facility] for follow-up.  Patient reports that she is having significant amount of stressors with her family as she has having to deal with her oldest boy who is having trouble at school and being suspended as well as dealing with her daughter who is thinking at the house even though she is grounded.  Patient reports that all in all she is stating that the audiovisual hallucinations have stopped and states that she is feeling more like herself but experiencing the stressors of everyday life.  Patient reports medications are appropriate and she is really happy with the Abilify  Maintena.  Patient reports currently that she is having a deal with her kids and reports the majority of her stress are coming from them.  Patient denies SI, HI, AVH.  Patient with no other question concerns.  Patient is in agreement with treatment plan.  Patient states that even though the stress is overwhelming she states that her audiovisual hallucinations are under control and she has had no noisy voices that she has complained about in the past.  Patient to follow-up in 2 weeks. Visit Diagnosis:    ICD-10-CM   1. Schizoaffective disorder, depressive type (HCC)  F25.1     2. Borderline personality disorder (HCC)  F60.3       Past Psychiatric History:  Previous Psych Hospitalizations: -8y, Between, inpatient for 6months. - Frequent hospitazization during middle school 7-8 months out of the school year.  - High school, 6 months out of the school year.  Outpatient treatment:  - No management since 19.  Medications Current: - Abilify  400mg  once a month shot - Recommended to Magnesium  glycinate 200-400mg  Next Steps: - Monitor anxiety and positive symptoms.  Medication Trials: - Depakote, side effects - Geodan, side effects -  Benzodiazepines, side effects - Seroquel, side effects.  Suicide & Violence: - Multiple attempts, Siince age of 6 with OD Substance Use: - Chronic marijuana use.  Psychotherapy: - Placed on [facility] waitlist for therapist.  Legal:  - Denies  Past Medical History:  Past Medical History:  Diagnosis Date   Anxiety    Asthma    Complication of anesthesia    I have a hard time coming out of general anesthesia, if I come out of it too fast, I get violent, I have to be eased out of it.   COPD (chronic obstructive pulmonary disease) (HCC)    HSV-2 (herpes simplex virus 2) infection    Hypertension    no meds   Iron  deficiency anemia 05/19/2021   Manic depression (HCC)    Menorrhagia    Migraine    Postoperative anemia due to acute blood loss 11/29/2015   Due to blood loss due to cesarean delivery    PTSD (post-traumatic stress disorder)    Schizophrenia, schizo-affective (HCC)    Vaginal Pap smear, abnormal     Past Surgical History:  Procedure Laterality Date   ABDOMINAL SURGERY     CARPAL TUNNEL RELEASE     CESAREAN SECTION     CESAREAN SECTION N/A 11/26/2015   Procedure: CESAREAN SECTION;  Surgeon: Mitzie BROCKS Ward, MD;  Location: [facility] ORS;  Service: Obstetrics;  Laterality: N/A;   CESAREAN SECTION WITH BILATERAL TUBAL LIGATION Bilateral 06/09/2021   Procedure: CESAREAN SECTION WITH BILATERAL TUBAL LIGATION;  Surgeon: Lake Read,  MD;  Location: [facility] OR[facility];  Service: Obstetrics;  Laterality: Bilateral;   cosmetic repairs     left arm, right knee and back.   LAPAROSCOPIC GASTRIC SLEEVE RESECTION     PANNICULECTOMY     TONSILLECTOMY      Family Psychiatric History: No Additional  Family History:  Family History  Problem Relation Age of Onset   Cancer Mother    Hypertension Mother    Heart disease Mother    Diabetes Mother    Lung disease Mother    Clotting disorder Mother    Bipolar disorder Mother    Mental illness Sister        SCHIZOPHRENIA    Social History:   Social History   Socioeconomic History   Marital status: Media Planner    Spouse name: Not on file   Number of children: 2   Years of education: Not on file   Highest education level: Some college, no degree  Occupational History   Not on file  Tobacco Use   Smoking status: Every Day    Current packs/day: 0.50    Types: Cigarettes, E-cigarettes   Smokeless tobacco: Never  Vaping Use   Vaping status: Every Day   Substances: Nicotine , Flavoring  Substance and Sexual Activity   Alcohol use: Not Currently    Comment: last use years ago   Drug use: Yes    Types: Marijuana    Comment: last MJ today   Sexual activity: Yes    Partners: Male, Female    Birth control/protection: Surgical  Other Topics Concern   Not on file  Social History Narrative   Not on file   Social Drivers of Health   Financial Resource Strain: Low Risk  (02/16/2024)   Overall Financial Resource Strain (CARDIA)    Difficulty of Paying Living Expenses: Not very hard  Food Insecurity: No Food Insecurity (02/16/2024)   Hunger Vital Sign    Worried About Running Out of Food in the Last Year: Never true    Ran Out of Food in the Last Year: Never true  Transportation Needs: No Transportation Needs (02/16/2024)   PRAPARE - Administrator, Civil Service (Medical): No    Lack of Transportation (Non-Medical): No  Physical Activity: Sufficiently Active (02/16/2024)   Exercise Vital Sign    Days of Exercise per Week: 5 days    Minutes of Exercise per Session: 60 min  Stress: Stress Concern Present (02/16/2024)   [employer] of Occupational Health - Occupational Stress Questionnaire    Feeling of Stress: Very much  Social Connections: Not on file    Allergies:  Allergies  Allergen Reactions   Petroleum Distillate Rash   Amitriptyline     Heavy sedation    Nsaids     No oral NSAIDS   Sulfa Antibiotics     Pt can not recall reaction    Latex Rash    Metabolic Disorder Labs: Lab  Results  Component Value Date   HGBA1C 5.0 01/05/2024   MPG 96.8 01/05/2024   No results found for: PROLACTIN Lab Results  Component Value Date   CHOL 166 02/16/2024   TRIG 61 02/16/2024   HDL CANCELED 02/16/2024   CHOLHDL CANCELED 02/16/2024   VLDL 42 (H) 01/05/2024   LDLCALC CANCELED 02/16/2024   LDLCALC 174 (H) 01/05/2024   Lab Results  Component Value Date   TSH 0.698 01/05/2024    Therapeutic Level Labs: No results found for: LITHIUM No results found for:  VALPROATE No results found for: CBMZ  Current Medications: Current Outpatient Medications  Medication Sig Dispense Refill   albuterol  (VENTOLIN  HFA) 108 (90 Base) MCG/ACT inhaler Inhale 2 puffs into the lungs every 6 (six) hours as needed for wheezing or shortness of breath. 18 g 0   amLODipine  (NORVASC ) 5 MG tablet TAKE 1 TABLET BY MOUTH ONCE DAILY. DO NOT TAKE THIS MEDICATION IF PREGNANT. 90 tablet 1   ARIPiprazole  ER (ABILIFY  MAINTENA) 400 MG PRSY prefilled syringe Inject 400 mg into the muscle every 28 (twenty-eight) days. 1 each    atorvastatin  (LIPITOR) 10 MG tablet Take 1 tablet by mouth once daily 90 tablet 0   Blood Pressure Monitoring (BLOOD PRESSURE KIT) DEVI Any brand available, measure BP daily 1 each 0   fluticasone  (FLONASE ) 50 MCG/ACT nasal spray Place 1-2 sprays into both nostrils daily. 16 g 3   levocetirizine (XYZAL ) 5 MG tablet TAKE 1 TABLET BY MOUTH ONCE DAILY IN THE EVENING 90 tablet 1   lisinopril  (ZESTRIL ) 2.5 MG tablet TAKE 1 TABLET BY MOUTH ONCE DAILY. DO NOT TAKE THIS MEDICATION IF PREGNANT. 90 tablet 1   mometasone  (ELOCON ) 0.1 % cream Apply 1 Application topically 2 (two) times daily. 100 g 1   norethindrone  (MICRONOR ) 0.35 MG tablet Take 1 tablet (0.35 mg total) by mouth daily. 84 tablet 3   Olopatadine  HCl 0.2 % SOLN Place 1 drop into affected eye(s) daily. 2.5 mL 3   ondansetron  (ZOFRAN -ODT) 4 MG disintegrating tablet Take 1 tablet (4 mg total) by mouth every 8 (eight) hours as  needed for nausea or vomiting. 20 tablet 0   No current facility-administered medications for this visit.     Musculoskeletal: Strength & Muscle Tone: within normal limits Gait & Station: normal Patient leans: N/A   Psychiatric Specialty Exam: Review of Systems  Constitutional: Negative.   HENT: Negative.    Eyes: Negative.   Respiratory: Negative.    Cardiovascular: Negative.   Gastrointestinal: Negative.   Genitourinary: Negative.   Musculoskeletal: Negative.   Skin: Negative.   Neurological: Negative.   Endo/Heme/Allergies: Negative.   Psychiatric/Behavioral:  The patient is nervous/anxious.      Today's Vitals   05/21/24 1445  BP: (!) 146/83  Pulse: 94  Temp: 98.1 F (36.7 C)  TempSrc: Temporal  Weight: 179 lb 9.6 oz (81.5 kg)  Height: 5' 2 (1.575 m)   Body mass index is 32.85 kg/m.   General Appearance: Well Groomed  Eye Contact:  Good  Speech:  Clear and Coherent  Volume:  Normal  Mood:  Anxious and Depressed  Affect:  Appropriate  Thought Process:  Coherent  Orientation:  Full (Time, Place, and Person)  Thought Content:  Hallucinations: Auditory Olfactory Visual  Suicidal Thoughts:  Yes.  without intent/plan  Homicidal Thoughts:  No  Memory:  Immediate;   Good Recent;   Good Remote;   Good  Judgement:  Fair  Insight:  Fair  Psychomotor Activity:  Normal  Concentration:  Concentration: Poor and Attention Span: Poor  Recall:  Good  Fund of Knowledge:Good  Language: Good  Akathisia:  No  Handed:  Right  AIMS (if indicated):    Assets:  Desire for Improvement Financial Resources/Insurance Housing  ADL's:  Intact  Cognition: WNL  Sleep:  Poor   Screenings: GAD-7    Flowsheet Row Office Visit from 05/07/2024 in [facility] Office Visit from 03/18/2024 in [facility] Office Visit from 02/16/2024 in [facility] Office Visit from 12/28/2023 in [facility]  Total GAD-7 Score 18 13 15 17    PHQ2-9    Flowsheet Row Office Visit from 05/07/2024 in [facility] Office Visit from 03/18/2024 in [facility] Office Visit from 02/16/2024 in [facility] Office Visit from 12/28/2023 in [facility] Clinical Support from 12/16/2020 in [facility] Department  PHQ-2 Total Score 4 3 3 3  0  PHQ-9 Total Score 13 17 15 19  --   Flowsheet Row Office Visit from 05/07/2024 in [facility] ED from 03/18/2024 in [facility] Emergency Department at [facility] ED to Hosp-Admission (Discharged) from 06/09/2021 in [facility]  C-SSRS RISK CATEGORY No Risk No Risk No Risk     Assessment and Plan:  Assessment - Diagnosis: Schizoaffective disorder, depressive type (HCC) [F25.1]  2. Borderline personality disorder (HCC) [F60.3]   - Risk Factors: Worsening symptoms, hospitalization, suicidal risk  Plan - Medications:  Continue Abilify  Maintenna 400mg , once a month IM. Recommended to take Magnesium  glycinate, 200mg  - 400mg  once daily.  - Psychotherapy: Placed on waitlist for Almarie Ligas - Education: Patient has been educated on how to reach this provider by [employer] or by calling the clinic.  Patient educated on medications, dosage, purpose, side effects, adverse reactions. - Follow-Up: Patient to follow-up in 1 week, will be followed weekly. - Referrals: Patient placed on wait list for referral for therapy.  [facility] - Safety Planning:  The patient has been educated, if they should have suicidal thoughts with or without a plan to call 911, or go to the closest emergency department.  Pt verbalized understanding.  Pt denies firearms within the home.  Pt also agrees to call the clinic should they have worsening symptoms before  the next appointment.   Patient/Guardian was advised Release of Information must be obtained prior to any record release in order to collaborate their care with an outside provider. Patient/Guardian was advised if they have not already done so to contact the registration department to sign all necessary forms in order for us  to release information regarding their care.   Consent: Patient/Guardian gives verbal consent for treatment and assignment of benefits for services provided during this visit. Patient/Guardian expressed understanding and agreed to proceed.    Dorn Jama Der, NP 05/21/2024, 3:03 PM

## 2024-05-22 ENCOUNTER — Other Ambulatory Visit: Payer: Self-pay

## 2024-05-23 ENCOUNTER — Other Ambulatory Visit: Payer: Self-pay

## 2024-05-24 NOTE — Progress Notes (Signed)
 05/27/2024 Michelle Kelley [id number] 1985/10/12  Gastroenterology Office Note    Referring Provider: Dineen Channel, PA-C Primary Care Physician:  Ostwalt, Janna, PA-C  Primary GI Provider: Jinny Carmine, MD    Chief Complaint   Chief Complaint  Patient presents with   New Patient (Initial Visit)    Diarrhea x 3 weeks has resolved- nausea when lays on left side-also has vomiting-reflux everyday-no meds at this time     History of Present Illness   Michelle Kelley is a 38 y.o. female with PMHx of anemia and gastric sleeve resection presenting today at the request of Ostwalt, Janna, PA-C due to chronic diarrhea.   Patient reports she was initially referred due to having 3 weeks of very bad diarrhea in September and have bleeding in her stools, but that has resolved.  She will occasionally have diarrhea off and on but that has been her normal for a long time.  She reports no diarrhea since 05/20/2024.  She has tried to eliminate things in her diet that upset her stomach such as eggs and lettuce. Last bowel movement was last night and it was more soft and mushy consistency. She denies abdominal pain.  Denies fevers, melena, or hematochezia currently.  Patient reports she takes iron  Gummies due to history of iron  deficiency anemia.  She has nausea daily and vomiting that occurs 3-4 times a week.  Sometimes she will vomit up food or sometimes it is food and a white foamy makes.  Nausea and vomiting do not seem to be related to specific foods.  She reports that basically all of her foods are heavily seasoned with Creole seasoning. She also reports that for the last 3 years she has not been able to sleep on her left side due to nausea and vomiting, she states that if she sleeps on her left side she will wake up and throw up in the bed.  She denies NSAID or alcohol use.  He drinks 4-5 bottles of water daily. She states she has a lot of medical anxiety and is seeing a therapist and thinks this  may be contributing to some of her symptoms.    Patient seen in the emergency room on 03/18/2024 with complaints of diarrhea for 3 weeks, left lower quadrant pain, and some blood noted in stool.  C. difficile negative, WBC within normal range.  03/17/2024 CT abdomen pelvis: No findings to account for left lower quadrant pain.  Minimal sigmoid diverticulosis without diverticulitis.  Small hiatal hernia, gastric sleeve with suture line crossing the diaphragmatic hiatus.  Has history of diverticulitis over 10 years ago and had a colonoscopy at that time.   Past Medical History:  Diagnosis Date   Anxiety    Asthma    Complication of anesthesia    I have a hard time coming out of general anesthesia, if I come out of it too fast, I get violent, I have to be eased out of it.   COPD (chronic obstructive pulmonary disease) (HCC)    HSV-2 (herpes simplex virus 2) infection    Hypertension    no meds   Iron  deficiency anemia 05/19/2021   Manic depression (HCC)    Menorrhagia    Migraine    Postoperative anemia due to acute blood loss 11/29/2015   Due to blood loss due to cesarean delivery    PTSD (post-traumatic stress disorder)    Schizophrenia, schizo-affective (HCC)    Vaginal Pap smear, abnormal     Past  Surgical History:  Procedure Laterality Date   ABDOMINAL SURGERY     CARPAL TUNNEL RELEASE     CESAREAN SECTION     CESAREAN SECTION N/A 11/26/2015   Procedure: CESAREAN SECTION;  Surgeon: Mitzie BROCKS Ward, MD;  Location: [facility];  Service: Obstetrics;  Laterality: N/A;   CESAREAN SECTION WITH BILATERAL TUBAL LIGATION Bilateral 06/09/2021   Procedure: CESAREAN SECTION WITH BILATERAL TUBAL LIGATION;  Surgeon: Lake Read, MD;  Location: [facility];  Service: Obstetrics;  Laterality: Bilateral;   cosmetic repairs     left arm, right knee and back.   LAPAROSCOPIC GASTRIC SLEEVE RESECTION     PANNICULECTOMY     TONSILLECTOMY      Current Outpatient Medications  Medication Sig  Dispense Refill   albuterol  (VENTOLIN  HFA) 108 (90 Base) MCG/ACT inhaler Inhale 2 puffs into the lungs every 6 (six) hours as needed for wheezing or shortness of breath. 18 g 0   amLODipine  (NORVASC ) 5 MG tablet TAKE 1 TABLET BY MOUTH ONCE DAILY. DO NOT TAKE THIS MEDICATION IF PREGNANT. 90 tablet 1   ARIPiprazole  ER (ABILIFY  MAINTENA) 400 MG PRSY prefilled syringe Inject 400 mg into the muscle every 28 (twenty-eight) days. 1 each    atorvastatin  (LIPITOR) 10 MG tablet Take 1 tablet by mouth once daily 90 tablet 0   Blood Pressure Monitoring (BLOOD PRESSURE KIT) DEVI Any brand available, measure BP daily 1 each 0   fluticasone  (FLONASE ) 50 MCG/ACT nasal spray Place 1-2 sprays into both nostrils daily. 16 g 3   levocetirizine (XYZAL ) 5 MG tablet TAKE 1 TABLET BY MOUTH ONCE DAILY IN THE EVENING 90 tablet 1   lisinopril  (ZESTRIL ) 2.5 MG tablet TAKE 1 TABLET BY MOUTH ONCE DAILY. DO NOT TAKE THIS MEDICATION IF PREGNANT. 90 tablet 1   mometasone  (ELOCON ) 0.1 % cream Apply 1 Application topically 2 (two) times daily. 100 g 1   Olopatadine  HCl 0.2 % SOLN Place 1 drop into affected eye(s) daily. 2.5 mL 3   pantoprazole  (PROTONIX ) 40 MG tablet Take 1 tablet (40 mg total) by mouth daily. 90 tablet 1   ondansetron  (ZOFRAN -ODT) 4 MG disintegrating tablet Take 1 tablet (4 mg total) by mouth every 8 (eight) hours as needed for nausea or vomiting. 20 tablet 0   No current facility-administered medications for this visit.    Allergies as of 05/27/2024 - Review Complete 05/27/2024  Allergen Reaction Noted   Amitriptyline  12/14/2014   Nsaids  12/14/2014   Sulfa antibiotics  12/14/2014   Latex Rash 12/14/2014    Family History  Problem Relation Age of Onset   Cancer Mother    Hypertension Mother    Heart disease Mother    Diabetes Mother    Lung disease Mother    Clotting disorder Mother    Bipolar disorder Mother    Mental illness Sister        SCHIZOPHRENIA    Social History   Socioeconomic  History   Marital status: Media Planner    Spouse name: Not on file   Number of children: 2   Years of education: Not on file   Highest education level: Some college, no degree  Occupational History   Not on file  Tobacco Use   Smoking status: Every Day    Current packs/day: 0.50    Types: Cigarettes, E-cigarettes   Smokeless tobacco: Never  Vaping Use   Vaping status: Every Day   Substances: Nicotine , Flavoring  Substance and Sexual Activity   Alcohol  use: Not Currently    Comment: last use years ago   Drug use: Yes    Types: Marijuana    Comment: last MJ today   Sexual activity: Yes    Partners: Male, Female    Birth control/protection: Surgical  Other Topics Concern   Not on file  Social History Narrative   Not on file   Social Drivers of Health   Financial Resource Strain: Low Risk  (02/16/2024)   Overall Financial Resource Strain (CARDIA)    Difficulty of Paying Living Expenses: Not very hard  Food Insecurity: No Food Insecurity (02/16/2024)   Hunger Vital Sign    Worried About Running Out of Food in the Last Year: Never true    Ran Out of Food in the Last Year: Never true  Transportation Needs: No Transportation Needs (02/16/2024)   PRAPARE - Administrator, Civil Service (Medical): No    Lack of Transportation (Non-Medical): No  Physical Activity: Sufficiently Active (02/16/2024)   Exercise Vital Sign    Days of Exercise per Week: 5 days    Minutes of Exercise per Session: 60 min  Stress: Stress Concern Present (02/16/2024)   [employer] of Occupational [employer] - Occupational Stress Questionnaire    Feeling of Stress: Very much  Social Connections: Not on file  Intimate Partner Violence: Not At Risk (02/16/2024)   Humiliation, Afraid, Rape, and Kick questionnaire    Fear of Current or Ex-Partner: No    Emotionally Abused: No    Physically Abused: No    Sexually Abused: No     RELEVANT GI HISTORY, IMAGING AND LABS: CBC    Component  Value Date/Time   WBC 9.7 03/18/2024 1417   RBC 4.60 03/18/2024 1417   HGB 13.8 03/18/2024 1417   HGB 13.4 02/16/2024 0915   HGB 12.4 04/22/2015 0000   HCT 41.2 03/18/2024 1417   HCT 41.8 02/16/2024 0915   HCT 37 04/22/2015 0000   PLT 387 03/18/2024 1417   PLT 340 02/16/2024 0915   PLT 283 04/22/2015 0000   MCV 89.6 03/18/2024 1417   MCV 92 02/16/2024 0915   MCV 88 06/23/2011 1622   MCH 30.0 03/18/2024 1417   MCHC 33.5 03/18/2024 1417   RDW 13.5 03/18/2024 1417   RDW 13.7 02/16/2024 0915   RDW 13.5 06/23/2011 1622   LYMPHSABS 2.4 03/18/2024 1417   LYMPHSABS 2.4 02/16/2024 0915   MONOABS 0.5 03/18/2024 1417   EOSABS 0.1 03/18/2024 1417   EOSABS 0.2 02/16/2024 0915   BASOSABS 0.1 03/18/2024 1417   BASOSABS 0.1 02/16/2024 0915   Recent Labs    01/05/24 1355 02/16/24 0915 03/18/24 1417  HGB 14.5 13.4 13.8    CMP     Component Value Date/Time   NA 136 03/18/2024 1417   NA 133 (L) 05/04/2021 1104   K 4.2 03/18/2024 1417   CL 103 03/18/2024 1417   CO2 20 (L) 03/18/2024 1417   GLUCOSE 109 (H) 03/18/2024 1417   BUN 6 03/18/2024 1417   BUN 11 05/04/2021 1104   CREATININE 0.59 03/18/2024 1417   CALCIUM  9.0 03/18/2024 1417   PROT 7.4 03/18/2024 1417   PROT 6.3 05/04/2021 1104   ALBUMIN 4.2 03/18/2024 1417   ALBUMIN 3.7 (L) 05/04/2021 1104   AST 40 03/18/2024 1417   ALT 29 03/18/2024 1417   ALKPHOS 103 03/18/2024 1417   BILITOT 0.7 03/18/2024 1417   BILITOT <0.2 05/04/2021 1104   GFRNONAA >60 03/18/2024 1417   GFRAA >60  11/26/2015 1659      Latest Ref Rng & Units 03/18/2024    2:17 PM 01/05/2024    1:55 PM 06/09/2021    2:55 PM  Hepatic Function  Total Protein 6.5 - 8.1 g/dL 7.4  8.2  6.0   Albumin 3.5 - 5.0 g/dL 4.2  4.6  2.5   AST 15 - 41 U/L 40  34  31   ALT 0 - 44 U/L 29  42  17   Alk Phosphatase 38 - 126 U/L 103  105  239   Total Bilirubin 0.0 - 1.2 mg/dL 0.7  0.6  0.7       Review of Systems   All systems reviewed and negative except where noted in  HPI.    Physical Exam  BP 126/82   Pulse 81   Temp 98.7 F (37.1 C)   Ht 5' 2 (1.575 m)   Wt 184 lb (83.5 kg)   LMP 05/20/2024   SpO2 100%   BMI 33.65 kg/m  Patient's last menstrual period was 05/20/2024. General:   Alert and oriented. Pleasant and cooperative. Well-nourished and well-developed. In no acute distress.  Head:  Normocephalic and atraumatic. Eyes:  Without icterus Ears:  Normal auditory acuity. Neck:  Supple; no masses or thyromegaly. Lungs:  Respirations even and unlabored.  Clear throughout to auscultation.   No wheezes, crackles, or rhonchi. No acute distress. Heart:  Regular rate and rhythm; no murmurs, clicks, rubs, or gallops. Abdomen:  Normal bowel sounds.  No bruits.  Soft, non-tender and non-distended without masses, hepatosplenomegaly or hernias noted.  No guarding or rebound tenderness. Healing scabs noted to lower abdomen. Rectal:  Deferred. Msk:  Symmetrical without gross deformities. Normal posture. Extremities:  Without edema. Neurologic:  Alert and  oriented x4;  grossly normal neurologically. Skin:  Intact without significant lesions or rashes. Psych:  Alert and cooperative. Normal mood and affect.   Assessment & Plan   Michelle Kelley is a 38 y.o. female presenting today with with intermittent diarrhea, nausea, and vomiting.   Nausea and vomiting.   - refilled zofran  - Discussed lifestyle modifications, avoid heavy seasonings and food, do not eat 3 hours prior to bed. - start pantoprazole  40 mg once daily 30 min prior to meals - Schedule EGD in the near future. I discussed risks of EGD with patient today, including risk of sedation, bleeding or perforation. Patient provides understanding and gave verbal consent to proceed.  Intermittent diarrhea - will check celiac, stool pathogen, and fecal calprotectin - start beneber and increase water intake - can take Imodium if needed as long as no blood in stool or fevers.  I discussed the  assessment and treatment plan with the patient. The patient was provided an opportunity to ask questions and all were answered. The patient agreed with the plan and demonstrated an understanding of the instructions.   The patient was advised to call back or seek an in-person evaluation if the symptoms worsen or if the condition fails to improve as anticipated.  Follow up in 2 months  Grayce Bohr, DNP, AGNP-C [facility]

## 2024-05-27 ENCOUNTER — Ambulatory Visit: Payer: MEDICAID | Admitting: Family Medicine

## 2024-05-27 ENCOUNTER — Encounter: Payer: Self-pay | Admitting: Oncology

## 2024-05-27 ENCOUNTER — Other Ambulatory Visit: Payer: Self-pay

## 2024-05-27 ENCOUNTER — Encounter: Payer: Self-pay | Admitting: Family Medicine

## 2024-05-27 VITALS — BP 126/82 | HR 81 | Temp 98.7°F | Ht 62.0 in | Wt 184.0 lb

## 2024-05-27 DIAGNOSIS — R112 Nausea with vomiting, unspecified: Secondary | ICD-10-CM

## 2024-05-27 DIAGNOSIS — R197 Diarrhea, unspecified: Secondary | ICD-10-CM

## 2024-05-27 MED ORDER — ONDANSETRON 4 MG PO TBDP
4.0000 mg | ORAL_TABLET | Freq: Three times a day (TID) | ORAL | 0 refills | Status: AC | PRN
  Filled 2024-05-27: qty 20, 7d supply, fill #0

## 2024-05-27 MED ORDER — PANTOPRAZOLE SODIUM 40 MG PO TBEC
40.0000 mg | DELAYED_RELEASE_TABLET | Freq: Every day | ORAL | 1 refills | Status: AC
  Filled 2024-05-27: qty 90, 90d supply, fill #0

## 2024-05-27 NOTE — Patient Instructions (Signed)
 Drink 64 ounces of Fluids Daily. Start Benefiber Mix 1 TBSP in a drink daily.

## 2024-05-28 ENCOUNTER — Encounter: Payer: Self-pay | Admitting: Oncology

## 2024-06-04 ENCOUNTER — Ambulatory Visit: Payer: MEDICAID | Admitting: Psychiatry

## 2024-06-05 ENCOUNTER — Other Ambulatory Visit: Payer: Self-pay

## 2024-06-05 ENCOUNTER — Encounter: Payer: Self-pay | Admitting: Psychiatry

## 2024-06-05 ENCOUNTER — Ambulatory Visit: Payer: MEDICAID | Admitting: Psychiatry

## 2024-06-05 VITALS — BP 143/84 | HR 80 | Temp 98.1°F | Ht 62.0 in | Wt 179.0 lb

## 2024-06-05 DIAGNOSIS — F251 Schizoaffective disorder, depressive type: Secondary | ICD-10-CM

## 2024-06-05 DIAGNOSIS — F603 Borderline personality disorder: Secondary | ICD-10-CM

## 2024-06-05 NOTE — Progress Notes (Signed)
 [facility] MD/PA/NP OP Progress Note  06/05/2024 9:07 AM Michelle Kelley  MRN:  [id number]  Chief Complaint:  Chief Complaint  Patient presents with   Follow-up   HPI: 38 year old female presenting [facility] for follow-up.  Patient reports that she is doing well stating that she has been feeling much better now that she started on Abilify  shot and she is currently satisfied with the current medication regimen.  Patient reports that she has finished her Christmas shopping and reports she is ready for the holidays.  Patient states mood 7 out of 10 but during the visit she received a call stating that her son got suspended from school and has to be removed from school until after the winter break.  Patient was apprehensive as well as in shock during the moment and stated she needed to leave right away.  Based on this assessment interview patient will continue medication regimen as prescribed.  Patient with no changes at this time patient will follow-up in 1 week due to having to leave early.  Patient with no other question concerns.  Patient denies SI, HI, AVH.  Patient to follow-up in 1 week. Visit Diagnosis:    ICD-10-CM   1. Schizoaffective disorder, depressive type (HCC)  F25.1     2. Borderline personality disorder (HCC)  F60.3       Past Psychiatric History:  Previous Psych Hospitalizations: -9y, [facility], inpatient for 6months. - Frequent hospitazization during middle school 7-8 months out of the school year.  - High school, 6 months out of the school year.  Outpatient treatment:  - No management since 19.  Medications Current: - Abilify  400mg  once a month shot - Recommended to Magnesium  glycinate 200-400mg  Next Steps: - Monitor anxiety and positive symptoms.  Medication Trials: - Depakote, side effects - Geodan, side effects - Benzodiazepines, side effects - Seroquel, side effects.  Suicide & Violence: - Multiple attempts, Siince age of 6 with OD Substance Use: - Chronic marijuana  use.  Psychotherapy: - Placed on [facility] waitlist for therapist.  Legal:  - Denies  Past Medical History:  Past Medical History:  Diagnosis Date   Anxiety    Asthma    Complication of anesthesia    I have a hard time coming out of general anesthesia, if I come out of it too fast, I get violent, I have to be eased out of it.   COPD (chronic obstructive pulmonary disease) (HCC)    HSV-2 (herpes simplex virus 2) infection    Hypertension    no meds   Iron  deficiency anemia 05/19/2021   Manic depression (HCC)    Menorrhagia    Migraine    Postoperative anemia due to acute blood loss 11/29/2015   Due to blood loss due to cesarean delivery    PTSD (post-traumatic stress disorder)    Schizophrenia, schizo-affective (HCC)    Vaginal Pap smear, abnormal     Past Surgical History:  Procedure Laterality Date   ABDOMINAL SURGERY     CARPAL TUNNEL RELEASE     CESAREAN SECTION     CESAREAN SECTION N/A 11/26/2015   Procedure: CESAREAN SECTION;  Surgeon: Mitzie BROCKS Ward, MD;  Location: [facility] ORS;  Service: Obstetrics;  Laterality: N/A;   CESAREAN SECTION WITH BILATERAL TUBAL LIGATION Bilateral 06/09/2021   Procedure: CESAREAN SECTION WITH BILATERAL TUBAL LIGATION;  Surgeon: Lake Read, MD;  Location: [facility] OR[facility];  Service: Obstetrics;  Laterality: Bilateral;   cosmetic repairs     left arm, right knee and  back.   LAPAROSCOPIC GASTRIC SLEEVE RESECTION     PANNICULECTOMY     TONSILLECTOMY      Family Psychiatric History: No additional  Family History:  Family History  Problem Relation Age of Onset   Cancer Mother    Hypertension Mother    Heart disease Mother    Diabetes Mother    Lung disease Mother    Clotting disorder Mother    Bipolar disorder Mother    Mental illness Sister        SCHIZOPHRENIA    Social History:  Social History   Socioeconomic History   Marital status: Media Planner    Spouse name: Not on file   Number of children: 2   Years of education: Not on  file   Highest education level: Some college, no degree  Occupational History   Not on file  Tobacco Use   Smoking status: Former    Current packs/day: 0.00    Types: Cigarettes, E-cigarettes    Quit date: 05/29/2024    Years since quitting: 0.0   Smokeless tobacco: Never  Vaping Use   Vaping status: Every Day   Substances: Nicotine , Flavoring  Substance and Sexual Activity   Alcohol use: Not Currently    Comment: last use years ago   Drug use: Yes    Types: Marijuana    Comment: last MJ today   Sexual activity: Yes    Partners: Male, Female    Birth control/protection: Surgical  Other Topics Concern   Not on file  Social History Narrative   Not on file   Social Drivers of Health   Tobacco Use: Medium Risk (06/05/2024)   Patient History    Smoking Tobacco Use: Former    Smokeless Tobacco Use: Never    Passive Exposure: Not on Actuary Strain: Low Risk (02/16/2024)   Overall Financial Resource Strain (CARDIA)    Difficulty of Paying Living Expenses: Not very hard  Food Insecurity: No Food Insecurity (02/16/2024)   Epic    Worried About Radiation Protection Practitioner of Food in the Last Year: Never true    Ran Out of Food in the Last Year: Never true  Transportation Needs: No Transportation Needs (02/16/2024)   Epic    Lack of Transportation (Medical): No    Lack of Transportation (Non-Medical): No  Physical Activity: Sufficiently Active (02/16/2024)   Exercise Vital Sign    Days of Exercise per Week: 5 days    Minutes of Exercise per Session: 60 min  Stress: Stress Concern Present (02/16/2024)   [employer] of Occupational Health - Occupational Stress Questionnaire    Feeling of Stress: Very much  Social Connections: Not on file  Depression (PHQ2-9): High Risk (05/21/2024)   Depression (PHQ2-9)    PHQ-2 Score: 12  Alcohol Screen: Low Risk (02/16/2024)   Alcohol Screen    Last Alcohol Screening Score (AUDIT): 0  Housing: Low Risk (02/16/2024)   Epic    Unable to  Pay for Housing in the Last Year: No    Number of Times Moved in the Last Year: 0    Homeless in the Last Year: No  Utilities: Not At Risk (02/16/2024)   Epic    Threatened with loss of utilities: No  Health Literacy: Adequate Health Literacy (02/16/2024)   B1300 Health Literacy    Frequency of need for help with medical instructions: Never    Allergies: Allergies[1]  Metabolic Disorder Labs: Lab Results  Component Value Date   HGBA1C  5.0 01/05/2024   MPG 96.8 01/05/2024   No results found for: PROLACTIN Lab Results  Component Value Date   CHOL 166 02/16/2024   TRIG 61 02/16/2024   HDL CANCELED 02/16/2024   CHOLHDL CANCELED 02/16/2024   VLDL 42 (H) 01/05/2024   LDLCALC CANCELED 02/16/2024   LDLCALC 174 (H) 01/05/2024   Lab Results  Component Value Date   TSH 0.698 01/05/2024    Therapeutic Level Labs: No results found for: LITHIUM No results found for: VALPROATE No results found for: CBMZ  Current Medications: Current Outpatient Medications  Medication Sig Dispense Refill   albuterol  (VENTOLIN  HFA) 108 (90 Base) MCG/ACT inhaler Inhale 2 puffs into the lungs every 6 (six) hours as needed for wheezing or shortness of breath. 18 g 0   amLODipine  (NORVASC ) 5 MG tablet TAKE 1 TABLET BY MOUTH ONCE DAILY. DO NOT TAKE THIS MEDICATION IF PREGNANT. 90 tablet 1   ARIPiprazole  ER (ABILIFY  MAINTENA) 400 MG PRSY prefilled syringe Inject 400 mg into the muscle every 28 (twenty-eight) days. 1 each    atorvastatin  (LIPITOR) 10 MG tablet Take 1 tablet by mouth once daily 90 tablet 0   Blood Pressure Monitoring (BLOOD PRESSURE KIT) DEVI Any brand available, measure BP daily 1 each 0   fluticasone  (FLONASE ) 50 MCG/ACT nasal spray Place 1-2 sprays into both nostrils daily. 16 g 3   levocetirizine (XYZAL ) 5 MG tablet TAKE 1 TABLET BY MOUTH ONCE DAILY IN THE EVENING 90 tablet 1   lisinopril  (ZESTRIL ) 2.5 MG tablet TAKE 1 TABLET BY MOUTH ONCE DAILY. DO NOT TAKE THIS MEDICATION IF  PREGNANT. 90 tablet 1   mometasone  (ELOCON ) 0.1 % cream Apply 1 Application topically 2 (two) times daily. 100 g 1   Olopatadine  HCl 0.2 % SOLN Place 1 drop into affected eye(s) daily. 2.5 mL 3   ondansetron  (ZOFRAN -ODT) 4 MG disintegrating tablet Take 1 tablet (4 mg total) by mouth every 8 (eight) hours as needed for nausea or vomiting. 20 tablet 0   pantoprazole  (PROTONIX ) 40 MG tablet Take 1 tablet (40 mg total) by mouth daily. 90 tablet 1   No current facility-administered medications for this visit.     Musculoskeletal: Strength & Muscle Tone: within normal limits Gait & Station: normal Patient leans: N/A   Psychiatric Specialty Exam: Review of Systems  Constitutional: Negative.   HENT: Negative.    Eyes: Negative.   Respiratory: Negative.    Cardiovascular: Negative.   Gastrointestinal: Negative.   Genitourinary: Negative.   Musculoskeletal: Negative.   Skin: Negative.   Neurological: Negative.   Endo/Heme/Allergies: Negative.   Psychiatric/Behavioral:  The patient is nervous/anxious.      Today's Vitals   06/05/24 0845  BP: (!) 143/84  Pulse: 80  Temp: 98.1 F (36.7 C)  TempSrc: Temporal  Weight: 179 lb (81.2 kg)  Height: 5' 2 (1.575 m)   Body mass index is 32.74 kg/m.    General Appearance: Well Groomed  Eye Contact:  Good  Speech:  Clear and Coherent  Volume:  Normal  Mood:  Anxious and Depressed  Affect:  Appropriate  Thought Process:  Coherent  Orientation:  Full (Time, Place, and Person)  Thought Content:  Hallucinations: Auditory Olfactory Visual  Suicidal Thoughts:  Yes.  without intent/plan  Homicidal Thoughts:  No  Memory:  Immediate;   Good Recent;   Good Remote;   Good  Judgement:  Fair  Insight:  Fair  Psychomotor Activity:  Normal  Concentration:  Concentration: Poor and Attention Span:  Poor  Recall:  Good  Fund of Knowledge:Good  Language: Good  Akathisia:  No  Handed:  Right  AIMS (if indicated):    Assets:  Desire for  Improvement Financial Resources/Insurance Housing  ADL's:  Intact  Cognition: WNL  Sleep:  Poor   Screenings: GAD-7    Flowsheet Row Office Visit from 05/21/2024 in [facility] Office Visit from 05/07/2024 in [facility] Office Visit from 03/18/2024 in [facility] Office Visit from 02/16/2024 in [facility] Office Visit from 12[facility]28/2023 in [facility]  Total GAD-7 Score 15 18 13 15 17    PHQ2-9    Flowsheet Row Office Visit from 05/21/2024 in [facility] Office Visit from 05/07/2024 in [facility] Office Visit from 03/18/2024 in [facility] Office Visit from 02/16/2024 in [facility] Office Visit from 12/28/2023 in [facility]  PHQ-2 Total Score 2 4 3 3 3   PHQ-9 Total Score 12 13 17 15 19    Flowsheet Row Office Visit from 05/07/2024 in [facility] ED from 03/18/2024 in [facility] Department at [facility] ED to Hosp-Admission (Discharged) from 06/09/2021 in [facility]  C-SSRS RISK CATEGORY No Risk No Risk No Risk     Assessment and Plan:  Assessment - Diagnosis: Schizoaffective disorder, depressive type (HCC) [F25.1]  2. Borderline personality disorder (HCC) [F60.3]   - Risk Factors: Worsening symptoms, hospitalization, suicidal risk  Plan - Medications:  Continue Abilify  Maintenna 400mg , once a month IM. Recommended to take Magnesium  glycinate, 200mg  - 400mg  once daily.  - Psychotherapy: Placed on waitlist for Almarie Ligas - Education: Patient has been educated on how to reach this provider by [employer] or by calling the clinic.  Patient educated on medications,  dosage, purpose, side effects, adverse reactions. - Follow-Up: Patient to follow-up in 1 week, will be followed weekly. - Referrals: Patient placed on wait list for referral for therapy.  [facility] - Safety Planning:  The patient has been educated, if they should have suicidal thoughts with or without a plan to call 911, or go to the closest emergency department.  Pt verbalized understanding.  Pt denies firearms within the home.  Pt also agrees to call the clinic should they have worsening symptoms before the next appointment.   Patient/Guardian was advised Release of Information must be obtained prior to any record release in order to collaborate their care with an outside provider. Patient/Guardian was advised if they have not already done so to contact the registration department to sign all necessary forms in order for us  to release information regarding their care.   Consent: Patient/Guardian gives verbal consent for treatment and assignment of benefits for services provided during this visit. Patient/Guardian expressed understanding and agreed to proceed.    Dorn Jama Der, NP 06/05/2024, 9:07 AM     [1]  Allergies Allergen Reactions   Amitriptyline     Heavy sedation    Nsaids     No oral NSAIDS   Sulfa Antibiotics     Pt can not recall reaction    Latex Rash

## 2024-06-10 ENCOUNTER — Other Ambulatory Visit: Payer: Self-pay

## 2024-06-10 ENCOUNTER — Other Ambulatory Visit: Payer: Self-pay | Admitting: Psychiatry

## 2024-06-10 ENCOUNTER — Ambulatory Visit: Payer: MEDICAID

## 2024-06-10 VITALS — BP 139/93 | HR 86 | Temp 98.0°F | Ht 62.0 in | Wt 181.2 lb

## 2024-06-10 DIAGNOSIS — F203 Undifferentiated schizophrenia: Secondary | ICD-10-CM | POA: Diagnosis not present

## 2024-06-10 LAB — CELIAC DISEASE AB SCREEN W/RFX
Deamidated Gliadin Abs, IgA: 41 U — AB (ref 0–19)
Immunoglobulin A, (IgA) QN, Serum: 162 mg/dL (ref 87–352)
t-Transglutaminase (tTG) IgA: <2 U/mL (ref 0–3)

## 2024-06-10 MED ORDER — ARIPIPRAZOLE ER 400 MG IM PRSY
400.0000 mg | PREFILLED_SYRINGE | Freq: Once | INTRAMUSCULAR | Status: AC
  Administered 2024-06-10: 400 mg via INTRAMUSCULAR

## 2024-06-10 NOTE — Progress Notes (Unsigned)
 Patient present flat affect mood was pleasant and denied visual or auditory hallucinations. No suicidal or homicidal ideations, no plan, intent, or means to want to harm self or others. Patients Abilify  Maintena 400 mg IM injection prepared as ordered and administered to patient in her left deltoid. Patient tolerated without discomfort or pain. Patient will return in 28 days and will call if there is any changes.     Lot [id number] EXP 06-19-2025

## 2024-06-10 NOTE — Patient Instructions (Signed)
 Patient present flat affect mood was pleasant and denied visual or auditory hallucinations. No suicidal or homicidal ideations, no plan, intent, or means to want to harm self or others. Patients Abilify  Maintena 400 mg IM injection prepared as ordered and administered to patient in her left deltoid. Patient tolerated without discomfort or pain. Patient will return in 28 days and will call if there is any changes.     Lot [id number] 06-19-2025

## 2024-06-11 ENCOUNTER — Encounter: Payer: Self-pay | Admitting: Psychiatry

## 2024-06-11 ENCOUNTER — Other Ambulatory Visit: Payer: Self-pay

## 2024-06-11 ENCOUNTER — Ambulatory Visit (INDEPENDENT_AMBULATORY_CARE_PROVIDER_SITE_OTHER): Payer: MEDICAID | Admitting: Psychiatry

## 2024-06-11 VITALS — BP 132/88 | HR 85 | Temp 97.9°F | Ht 62.0 in | Wt 178.0 lb

## 2024-06-11 DIAGNOSIS — F251 Schizoaffective disorder, depressive type: Secondary | ICD-10-CM

## 2024-06-11 DIAGNOSIS — F603 Borderline personality disorder: Secondary | ICD-10-CM | POA: Diagnosis not present
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
## 2024-06-14 ENCOUNTER — Other Ambulatory Visit: Payer: Self-pay

## 2024-06-14 ENCOUNTER — Encounter: Payer: Self-pay | Admitting: Obstetrics

## 2024-06-14 ENCOUNTER — Ambulatory Visit (INDEPENDENT_AMBULATORY_CARE_PROVIDER_SITE_OTHER): Payer: MEDICAID | Admitting: Obstetrics

## 2024-06-14 VITALS — BP 120/85 | HR 74 | Ht 62.0 in | Wt 179.0 lb

## 2024-06-14 DIAGNOSIS — N83201 Unspecified ovarian cyst, right side: Secondary | ICD-10-CM

## 2024-06-14 DIAGNOSIS — N939 Abnormal uterine and vaginal bleeding, unspecified: Secondary | ICD-10-CM | POA: Diagnosis not present

## 2024-06-14 MED ORDER — DESOGESTREL-ETHINYL ESTRADIOL 0.15-0.02/0.01 MG (21/5) PO TABS
1.0000 | ORAL_TABLET | Freq: Every day | ORAL | 3 refills | Status: AC
  Filled 2024-06-14: qty 84, 84d supply, fill #0

## 2024-06-14 NOTE — Progress Notes (Signed)
" ° °  GYN ENCOUNTER  Subjective  HPI: Michelle Kelley is a 38 y.o. [id number] who presents today for bleeding on POPs. Her periods have improved on progesterone, but she is still having 7-10 days of brown spotting after her period. She denies other abnormal discharge or pain. She also notes that a 4-cm right ovarian cyst was found incidentally on a recent CT scan. She denies any pain or symptoms from the cyst.  Past Medical History:  Diagnosis Date   Anxiety    Asthma    Complication of anesthesia    I have a hard time coming out of general anesthesia, if I come out of it too fast, I get violent, I have to be eased out of it.   COPD (chronic obstructive pulmonary disease) (HCC)    HSV-2 (herpes simplex virus 2) infection    Hypertension    no meds   Iron  deficiency anemia 05/19/2021   Manic depression (HCC)    Menorrhagia    Migraine    Postoperative anemia due to acute blood loss 11/29/2015   Due to blood loss due to cesarean delivery    PTSD (post-traumatic stress disorder)    Schizophrenia, schizo-affective (HCC)    Vaginal Pap smear, abnormal    Past Surgical History:  Procedure Laterality Date   ABDOMINAL SURGERY     CARPAL TUNNEL RELEASE     CESAREAN SECTION     CESAREAN SECTION N/A 11/26/2015   Procedure: CESAREAN SECTION;  Surgeon: Mitzie BROCKS Ward, MD;  Location: [facility] ORS;  Service: Obstetrics;  Laterality: N/A;   CESAREAN SECTION WITH BILATERAL TUBAL LIGATION Bilateral 06/09/2021   Procedure: CESAREAN SECTION WITH BILATERAL TUBAL LIGATION;  Surgeon: Lake Read, MD;  Location: [facility] ORS;  Service: Obstetrics;  Laterality: Bilateral;   cosmetic repairs     left arm, right knee and back.   LAPAROSCOPIC GASTRIC SLEEVE RESECTION     PANNICULECTOMY     TONSILLECTOMY     OB History     Gravida  5   Para  4   Term  3   Preterm  1   AB  1   Living  4      SAB  1   IAB  0   Ectopic  0   Multiple  0   Live Births  4          Allergies[1]  ROS:  See HPI   Objective  BP 120/85   Pulse 74   Ht 5' 2 (1.575 m)   Wt 179 lb (81.2 kg)   LMP 05/20/2024   BMI 32.74 kg/m   Physical examination General: Alert, cooperative, NAD Pelvic: deferred  Assessment -Intermenstrual bleeding on POPs  Plan -Discussed switching birth control methods to a COC. She is quitting vape but still uses about 1 cartridge/month. Discussed increased risk of DVT/stroke and red flag signs. Rx sent for Mircette . Reviewed proper administration and warning signs. -Offered pelvic US  for cyst surveillance; will defer for now and consider if she starts developing pain. -F/u in 3-6 months   Eleanor Canny, CNM       [1]  Allergies Allergen Reactions   Amitriptyline     Heavy sedation    Nsaids     No oral NSAIDS   Sulfa Antibiotics     Pt can not recall reaction    Latex Rash   "

## 2024-06-16 ENCOUNTER — Ambulatory Visit: Payer: Self-pay | Admitting: Family Medicine

## 2024-06-25 ENCOUNTER — Other Ambulatory Visit: Payer: Self-pay

## 2024-06-25 ENCOUNTER — Encounter: Payer: Self-pay | Admitting: Psychiatry

## 2024-06-25 ENCOUNTER — Ambulatory Visit: Payer: MEDICAID | Admitting: Psychiatry

## 2024-06-25 VITALS — BP 136/88 | HR 83 | Temp 97.7°F | Ht 62.0 in | Wt 184.4 lb

## 2024-06-25 DIAGNOSIS — F603 Borderline personality disorder: Secondary | ICD-10-CM | POA: Diagnosis not present

## 2024-06-25 DIAGNOSIS — F251 Schizoaffective disorder, depressive type: Secondary | ICD-10-CM

## 2024-06-25 NOTE — Progress Notes (Unsigned)
 [facility] MD/PA/NP OP Progress Note  06/25/2024 11:14 AM Michelle Kelley  MRN:  [id number]  Chief Complaint:  Chief Complaint  Patient presents with   Follow-up   HPI: 39 year old female presenting [facility] for follow-up.  Patient reports that she is doing well stating that she is happy that the Christmas season has not is finished and states that her kids are doing well.  Patient reports that she had a root canal that was unplanned and stated that it was a financial hit but she had reported that she had savings to cover the cost.  Patient reports all in all that she is feeling much better there is no hallucinations and states that she is in a much better mood now and feels stable.  Patient denies any new hallucinations and also reports that her mood is feeling consistent and with no explosive irritability or anger.  Patient will continue current medication regimen.  Patient with no other question concerns at this time.  See plan.  Patient will follow this provider to the next practice therefore no follow-up needed.  Patient denies SI, HI, AVH.   Visit Diagnosis:    ICD-10-CM   1. Schizoaffective disorder, depressive type (HCC)  F25.1     2. Borderline personality disorder (HCC)  F60.3       Past Psychiatric History:  Previous Psych Hospitalizations: -9y, Michelle Kelley, inpatient for 6months. - Frequent hospitazization during middle school 7-8 months out of the school year.  - High school, 6 months out of the school year.  Outpatient treatment:  - No management since 19.  Medications Current: - Abilify  400mg  once a month shot - Recommended to Magnesium  glycinate 200-400mg  Next Steps: - Monitor anxiety and positive symptoms.  Medication Trials: - Depakote, side effects - Geodan, side effects - Benzodiazepines, side effects - Seroquel, side effects.  Suicide & Violence: - Multiple attempts, Siince age of 6 with OD Substance Use: - Chronic marijuana use.  Psychotherapy: - Placed on [facility]  waitlist for therapist.  Legal:  - Denies  Past Medical History:  Past Medical History:  Diagnosis Date   Anxiety    Asthma    Complication of anesthesia    I have a hard time coming out of general anesthesia, if I come out of it too fast, I get violent, I have to be eased out of it.   COPD (chronic obstructive pulmonary disease) (HCC)    HSV-2 (herpes simplex virus 2) infection    Hypertension    no meds   Iron  deficiency anemia 05/19/2021   Manic depression (HCC)    Menorrhagia    Migraine    Postoperative anemia due to acute blood loss 11/29/2015   Due to blood loss due to cesarean delivery    PTSD (post-traumatic stress disorder)    Schizophrenia, schizo-affective (HCC)    Vaginal Pap smear, abnormal     Past Surgical History:  Procedure Laterality Date   ABDOMINAL SURGERY     CARPAL TUNNEL RELEASE     CESAREAN SECTION     CESAREAN SECTION N/A 11/26/2015   Procedure: CESAREAN SECTION;  Surgeon: Mitzie BROCKS Ward, MD;  Location: [facility] ORS;  Service: Obstetrics;  Laterality: N/A;   CESAREAN SECTION WITH BILATERAL TUBAL LIGATION Bilateral 06/09/2021   Procedure: CESAREAN SECTION WITH BILATERAL TUBAL LIGATION;  Surgeon: Lake Read, MD;  Location: [facility] OR[facility];  Service: Obstetrics;  Laterality: Bilateral;   cosmetic repairs     left arm, right knee and back.   LAPAROSCOPIC GASTRIC  SLEEVE RESECTION     PANNICULECTOMY     TONSILLECTOMY      Family Psychiatric History: No additional  Family History:  Family History  Problem Relation Age of Onset   Cancer Mother    Hypertension Mother    Heart disease Mother    Diabetes Mother    Lung disease Mother    Clotting disorder Mother    Bipolar disorder Mother    Mental illness Sister        SCHIZOPHRENIA    Social History:  Social History   Socioeconomic History   Marital status: Media Planner    Spouse name: Not on file   Number of children: 2   Years of education: Not on file   Highest education level: Some  college, no degree  Occupational History   Not on file  Tobacco Use   Smoking status: Former    Current packs/day: 0.00    Types: Cigarettes, E-cigarettes    Quit date: 05/29/2024    Years since quitting: 0.0   Smokeless tobacco: Never  Vaping Use   Vaping status: Every Day   Substances: Nicotine , Flavoring  Substance and Sexual Activity   Alcohol use: Not Currently    Comment: last use years ago   Drug use: Yes    Types: Marijuana    Comment: last MJ today   Sexual activity: Yes    Partners: Male, Female    Birth control/protection: Pill  Other Topics Concern   Not on file  Social History Narrative   Not on file   Social Drivers of Health   Tobacco Use: Medium Risk (06/25/2024)   Patient History    Smoking Tobacco Use: Former    Smokeless Tobacco Use: Never    Passive Exposure: Not on Actuary Strain: Low Risk (02/16/2024)   Overall Financial Resource Strain (CARDIA)    Difficulty of Paying Living Expenses: Not very hard  Food Insecurity: No Food Insecurity (02/16/2024)   Epic    Worried About Radiation Protection Practitioner of Food in the Last Year: Never true    Ran Out of Food in the Last Year: Never true  Transportation Needs: No Transportation Needs (02/16/2024)   Epic    Lack of Transportation (Medical): No    Lack of Transportation (Non-Medical): No  Physical Activity: Sufficiently Active (02/16/2024)   Exercise Vital Sign    Days of Exercise per Week: 5 days    Minutes of Exercise per Session: 60 min  Stress: Stress Concern Present (02/16/2024)   [employer] of Occupational Health - Occupational Stress Questionnaire    Feeling of Stress: Very much  Social Connections: Not on file  Depression (PHQ2-9): Low Risk (06/11/2024)   Depression (PHQ2-9)    PHQ-2 Score: 1  Recent Concern: Depression (PHQ2-9) - High Risk (05/21/2024)   Depression (PHQ2-9)    PHQ-2 Score: 12  Alcohol Screen: Low Risk (02/16/2024)   Alcohol Screen    Last Alcohol Screening Score  (AUDIT): 0  Housing: Low Risk (02/16/2024)   Epic    Unable to Pay for Housing in the Last Year: No    Number of Times Moved in the Last Year: 0    Homeless in the Last Year: No  Utilities: Not At Risk (02/16/2024)   Epic    Threatened with loss of utilities: No  Health Literacy: Adequate Health Literacy (02/16/2024)   B1300 Health Literacy    Frequency of need for help with medical instructions: Never    Allergies:  Allergies[1]  Metabolic Disorder Labs: Lab Results  Component Value Date   HGBA1C 5.0 01/05/2024   MPG 96.8 01/05/2024   No results found for: PROLACTIN Lab Results  Component Value Date   CHOL 166 02/16/2024   TRIG 61 02/16/2024   HDL CANCELED 02/16/2024   CHOLHDL CANCELED 02/16/2024   VLDL 42 (H) 01/05/2024   LDLCALC CANCELED 02/16/2024   LDLCALC 174 (H) 01/05/2024   Lab Results  Component Value Date   TSH 0.698 01/05/2024    Therapeutic Level Labs: No results found for: LITHIUM No results found for: VALPROATE No results found for: CBMZ  Current Medications: Current Outpatient Medications  Medication Sig Dispense Refill   albuterol  (VENTOLIN  HFA) 108 (90 Base) MCG/ACT inhaler Inhale 2 puffs into the lungs every 6 (six) hours as needed for wheezing or shortness of breath. 18 g 0   amLODipine  (NORVASC ) 5 MG tablet TAKE 1 TABLET BY MOUTH ONCE DAILY. DO NOT TAKE THIS MEDICATION IF PREGNANT. 90 tablet 1   ARIPiprazole  ER (ABILIFY  MAINTENA) 400 MG PRSY prefilled syringe Inject 400 mg into the muscle every 28 (twenty-eight) days. 1 each    atorvastatin  (LIPITOR) 10 MG tablet Take 1 tablet by mouth once daily 90 tablet 0   Blood Pressure Monitoring (BLOOD PRESSURE KIT) DEVI Any brand available, measure BP daily 1 each 0   desogestrel -ethinyl estradiol  (MIRCETTE ) 0.15-0.02/0.01 MG (21/5) tablet Take 1 tablet by mouth daily. 84 tablet 3   fluticasone  (FLONASE ) 50 MCG/ACT nasal spray Place 1-2 sprays into both nostrils daily. 16 g 3   levocetirizine  (XYZAL ) 5 MG tablet TAKE 1 TABLET BY MOUTH ONCE DAILY IN THE EVENING 90 tablet 1   lisinopril  (ZESTRIL ) 2.5 MG tablet TAKE 1 TABLET BY MOUTH ONCE DAILY. DO NOT TAKE THIS MEDICATION IF PREGNANT. 90 tablet 1   mometasone  (ELOCON ) 0.1 % cream Apply 1 Application topically 2 (two) times daily. 100 g 1   Olopatadine  HCl 0.2 % SOLN Place 1 drop into affected eye(s) daily. 2.5 mL 3   ondansetron  (ZOFRAN -ODT) 4 MG disintegrating tablet Take 1 tablet (4 mg total) by mouth every 8 (eight) hours as needed for nausea or vomiting. 20 tablet 0   pantoprazole  (PROTONIX ) 40 MG tablet Take 1 tablet (40 mg total) by mouth daily. 90 tablet 1   No current facility-administered medications for this visit.     Musculoskeletal: Strength & Muscle Tone: within normal limits Gait & Station: normal Patient leans: N/A   Psychiatric Specialty Exam: Review of Systems  Constitutional: Negative.   HENT: Negative.    Eyes: Negative.   Respiratory: Negative.    Cardiovascular: Negative.   Gastrointestinal: Negative.   Genitourinary: Negative.   Musculoskeletal: Negative.   Skin: Negative.   Neurological: Negative.   Endo/Heme/Allergies: Negative.   Psychiatric/Behavioral:  The patient is nervous/anxious.      Today's Vitals   06/25/24 1050  BP: 136/88  Pulse: 83  Temp: 97.7 F (36.5 C)  TempSrc: Temporal  Weight: 184 lb 6.4 oz (83.6 kg)  Height: 5' 2 (1.575 m)   Body mass index is 33.73 kg/m.     General Appearance: Well Groomed  Eye Contact:  Good  Speech:  Clear and Coherent  Volume:  Normal  Mood:  Anxious and Depressed  Affect:  Appropriate  Thought Process:  Coherent  Orientation:  Full (Time, Place, and Person)  Thought Content:  Hallucinations: Auditory Olfactory Visual  Suicidal Thoughts:  Yes.  without intent/plan  Homicidal Thoughts:  No  Memory:  Immediate;   Good Recent;   Good Remote;   Good  Judgement:  Fair  Insight:  Fair  Psychomotor Activity:  Normal  Concentration:   Concentration: Poor and Attention Span: Poor  Recall:  Good  Fund of Knowledge:Good  Language: Good  Akathisia:  No  Handed:  Right  AIMS (if indicated):    Assets:  Desire for Improvement Financial Resources/Insurance Housing  ADL's:  Intact  Cognition: WNL  Sleep:  Poor   Screenings: GAD-7    Flowsheet Row Office Visit from 06/11/2024 in [facility] Office Visit from 05/21/2024 in [facility] Visit from 05/07/2024 in [facility] Visit from 03/18/2024 in [facility] Office Visit from 02/16/2024 in [facility]  Total GAD-7 Score 8 15 18 13 15    PHQ2-9    Flowsheet Row Office Visit from 06/11/2024 in [facility] Office Visit from 05/21/2024 in [facility] Office Visit from 05/07/2024 in [facility] Office Visit from 03/18/2024 in [facility] Office Visit from 02/16/2024 in [facility]  PHQ-2 Total Score 1 2 4 3 3   PHQ-9 Total Score -- 12 13 17 15    Flowsheet Row Office Visit from 05/07/2024 in [facility] from 03/18/2024 in [facility] at [facility] ED to Hosp-Admission (Discharged) from 06/09/2021 in [facility]  C-SSRS RISK CATEGORY No Risk No Risk No Risk     Assessment and Plan:  Assessment - Diagnosis: Schizoaffective disorder, depressive type (HCC) [F25.1]  2. Borderline personality disorder (HCC) [F60.3]   - Risk Factors: Worsening symptoms, hospitalization, suicidal risk  Plan - Medications:  Change Abilify  Asimtufii 960mg , once once every 2 month IM. Recommended to take Magnesium  glycinate, 200mg  - 400mg   once daily.  - Psychotherapy: Placed on waitlist for Almarie Ligas - Education: Patient has been educated on how to reach this provider by [employer] or by calling the clinic.  Patient educated on medications, dosage, purpose, side effects, adverse reactions. - Follow-Up: Patient to follow-up in 1 week, will be followed weekly. - Referrals: Patient placed on wait list for referral for therapy.  [facility] - Safety Planning:  The patient has been educated, if they should have suicidal thoughts with or without a plan to call 911, or go to the closest emergency department.  Pt verbalized understanding.  Pt denies firearms within the home.  Pt also agrees to call the clinic should they have worsening symptoms before the next appointment.     Patient/Guardian was advised Release of Information must be obtained prior to any record release in order to collaborate their care with an outside provider. Patient/Guardian was advised if they have not already done so to contact the registration department to sign all necessary forms in order for us  to release information regarding their care.   Consent: Patient/Guardian gives verbal consent for treatment and assignment of benefits for services provided during this visit. Patient/Guardian expressed understanding and agreed to proceed.    Dorn Jama Der, NP 06/25/2024, 11:14 AM     [1]  Allergies Allergen Reactions   Amitriptyline     Heavy sedation    Nsaids     No oral NSAIDS   Sulfa Antibiotics     Pt can not recall reaction  Latex Rash

## 2024-06-27 ENCOUNTER — Ambulatory Visit (INDEPENDENT_AMBULATORY_CARE_PROVIDER_SITE_OTHER): Payer: MEDICAID

## 2024-06-27 DIAGNOSIS — F439 Reaction to severe stress, unspecified: Secondary | ICD-10-CM | POA: Diagnosis not present

## 2024-06-27 DIAGNOSIS — F251 Schizoaffective disorder, depressive type: Secondary | ICD-10-CM | POA: Insufficient documentation
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
## 2024-07-04 ENCOUNTER — Ambulatory Visit: Payer: MEDICAID

## 2024-07-04 ENCOUNTER — Encounter: Payer: Self-pay | Admitting: Gastroenterology

## 2024-07-08 NOTE — Progress Notes (Unsigned)
 "  THERAPIST PROGRESS NOTE  Session Time: 30  Participation Level: Active  Behavioral Response: CasualAlertEuthymic  Type of Therapy: Individual Therapy  Treatment Goals addressed: STG: Shameika will participate in therapy to learn and develop skills to reduce the impact of the daily symptoms of depression from daily to no more than three times a week. STG: Kaytlynne will  develop emotion regulation skills to manage symptoms and reduce impairment from daily to no more than three times a week.  STG: Halsey will  participate in EMDR and other therapeutic techniques to process and resolve past trauma and reduce trauma reactivity from daily to no more than three times per week.   ProgressTowards Goals: Initial  Interventions: CBT, Motivational Interviewing, and Solution Focused  Summary: ZAN TRISKA is a 39 y.o. female who presents with a history of schizoaffective disorder and depression. Therapist greeted Zanaiya warmly and spent a few minutes checking in and discussing how things have been since the last session.  She presents for session alert and oriented; mood and affect neutral. Speech is clear and coherent at normal rate and tone. Engaged and cooperative in session. Therapist spent the first part of session creating treatment plan and goals.  Therapist also spent time to assess management of behavioral symptoms and any safety concerns and current level of functioning.  She denies all safety concerns and continues to work towards goals.  Kimberle requested a shorter session today as she had to get to her dentist appointment.   Therapist spent the second part of session on psychoeducation, skill development and therapeutic interventions in session. Shawne shared that the humming and cold water  exposure has been working well to manage some of her anxiety symptoms.  She shared that she has been using them frequently to help manage symptoms daily.  Tawni also shared that she has a  tendency to shut down emotionally and check out almost dissociating when things trigger her.  She shared an example that it happened with her boyfriend in the past week.  Therapist used a flow back technique to identify core memory associated with it.  Therapist used CBT and solution-focused strategies and worksheets in session to help [employer] challenge negative thoughts and beliefs. Mariah responded well to the interventions and shared that it helped to shift her perspective to look at things in a new way. A wrap up meditation activity was done at the end of the session and Zelie responded well to it.   Suicidal/Homicidal: No without intent/plan  Therapist Response: Therapist used Motivational Interviewing to facilitate discussion, elicit pertinent information and summarized thoughts and feelings for clarity and accuracy. Used supportive language and validation to provide a safe space to process thoughts and feelings. Therapist used CBT and solution-focused worksheets in session as therapeutic techniques to address presenting concerns. Therapist noted that Malisa has a tendency to operate from distorted thinking patterns.  Using the worksheets in session and other activities helped her challenge thought patterns and she responded well.  Therapist reviewed and summarized what was discussed in session today and asked Allyse if there were any questions and provided clarity as needed. No safety concerns were noted during session  and SI/HI/AVH were not present. Homework was assigned. Therapist wrapped up with a mindfulness based  activity and a follow up meeting time was scheduled.   Plan: Return again in 1 weeks.  Diagnosis: Trauma and stressor-related disorder  Schizoaffective disorder, depressive type (HCC)  Collaboration of Care: Medication Management AEB chart review  Patient/Guardian was advised  Release of Information must be obtained prior to any record release in order to collaborate  their care with an outside provider. Patient/Guardian was advised if they have not already done so to contact the registration department to sign all necessary forms in order for us  to release information regarding their care.   Consent: Patient/Guardian gives verbal consent for treatment and assignment of benefits for services provided during this visit. Patient/Guardian expressed understanding and agreed to proceed.   Curtiss RAYMOND Carrie, [facility] 07/09/2024  "

## 2024-07-09 ENCOUNTER — Ambulatory Visit (INDEPENDENT_AMBULATORY_CARE_PROVIDER_SITE_OTHER): Payer: MEDICAID

## 2024-07-09 DIAGNOSIS — F251 Schizoaffective disorder, depressive type: Secondary | ICD-10-CM

## 2024-07-09 DIAGNOSIS — F439 Reaction to severe stress, unspecified: Secondary | ICD-10-CM | POA: Diagnosis not present

## 2024-07-10 ENCOUNTER — Other Ambulatory Visit: Payer: Self-pay

## 2024-07-10 ENCOUNTER — Ambulatory Visit: Payer: MEDICAID

## 2024-07-10 ENCOUNTER — Ambulatory Visit: Payer: MEDICAID | Admitting: Anesthesiology

## 2024-07-10 ENCOUNTER — Encounter: Payer: Self-pay | Admitting: Gastroenterology

## 2024-07-10 ENCOUNTER — Ambulatory Visit
Admission: RE | Admit: 2024-07-10 | Discharge: 2024-07-10 | Disposition: A | Discharge status: Home / Self Care | Payer: MEDICAID | Attending: Gastroenterology | Admitting: Gastroenterology

## 2024-07-10 ENCOUNTER — Encounter
Admission: RE | Disposition: A | Discharge status: Home / Self Care | Payer: Self-pay | Source: Home / Self Care | Attending: Gastroenterology

## 2024-07-10 DIAGNOSIS — Z9884 Bariatric surgery status: Secondary | ICD-10-CM | POA: Diagnosis not present

## 2024-07-10 DIAGNOSIS — Z87891 Personal history of nicotine dependence: Secondary | ICD-10-CM | POA: Diagnosis not present

## 2024-07-10 DIAGNOSIS — K2100 Gastro-esophageal reflux disease with esophagitis, without bleeding: Secondary | ICD-10-CM | POA: Diagnosis not present

## 2024-07-10 DIAGNOSIS — F1290 Cannabis use, unspecified, uncomplicated: Secondary | ICD-10-CM | POA: Insufficient documentation

## 2024-07-10 DIAGNOSIS — R112 Nausea with vomiting, unspecified: Secondary | ICD-10-CM | POA: Insufficient documentation

## 2024-07-10 DIAGNOSIS — K449 Diaphragmatic hernia without obstruction or gangrene: Secondary | ICD-10-CM | POA: Diagnosis not present

## 2024-07-10 DIAGNOSIS — R7689 Other specified abnormal immunological findings in serum: Secondary | ICD-10-CM

## 2024-07-10 DIAGNOSIS — I10 Essential (primary) hypertension: Secondary | ICD-10-CM | POA: Insufficient documentation

## 2024-07-10 DIAGNOSIS — R197 Diarrhea, unspecified: Secondary | ICD-10-CM | POA: Diagnosis present

## 2024-07-10 HISTORY — DX: Depression, unspecified: F32.A

## 2024-07-10 HISTORY — DX: Gastro-esophageal reflux disease without esophagitis: K21.9

## 2024-07-10 HISTORY — PX: ESOPHAGOGASTRODUODENOSCOPY: SHX5428

## 2024-07-10 LAB — POCT PREGNANCY, URINE: Preg Test, Ur: NEGATIVE

## 2024-07-10 MED ORDER — SODIUM CHLORIDE 0.9 % IV SOLN: INTRAVENOUS | Status: DC

## 2024-07-10 MED ORDER — PROPOFOL 1000 MG/100ML IV EMUL
INTRAVENOUS | Status: AC
  Filled 2024-07-10: qty 100

## 2024-07-10 MED ORDER — LIDOCAINE HCL (CARDIAC) PF 100 MG/5ML IV SOSY
PREFILLED_SYRINGE | INTRAVENOUS | Status: DC | PRN
  Administered 2024-07-10: 100 mg via INTRAVENOUS

## 2024-07-10 MED ORDER — STERILE WATER FOR IRRIGATION IR SOLN
Status: DC | PRN
  Administered 2024-07-10: 1

## 2024-07-10 MED ORDER — LIDOCAINE HCL (PF) 2 % IJ SOLN
INTRAMUSCULAR | Status: AC
  Filled 2024-07-10: qty 5

## 2024-07-10 MED ORDER — DEXMEDETOMIDINE HCL IN NACL 80 MCG/20ML IV SOLN
INTRAVENOUS | Status: DC | PRN
  Administered 2024-07-10: 8 ug via INTRAVENOUS

## 2024-07-10 MED ORDER — LACTATED RINGERS IV SOLN: INTRAVENOUS | Status: DC

## 2024-07-10 MED ORDER — PROPOFOL 10 MG/ML IV BOLUS
INTRAVENOUS | Status: DC | PRN
  Administered 2024-07-10 (×2): 30 mg via INTRAVENOUS
  Administered 2024-07-10 (×2): 40 mg via INTRAVENOUS
  Administered 2024-07-10: 70 mg via INTRAVENOUS

## 2024-07-10 NOTE — Anesthesia Postprocedure Evaluation (Signed)
"   Anesthesia Post Note  Patient: Princessa K Jeanlouis  Procedure(s) Performed: EGD (ESOPHAGOGASTRODUODENOSCOPY) WITH BIOPSY (Mouth)  Patient location during evaluation: PACU Anesthesia Type: General Level of consciousness: awake and alert Pain management: pain level controlled Vital Signs Assessment: post-procedure vital signs reviewed and stable Respiratory status: spontaneous breathing, nonlabored ventilation, respiratory function stable and patient connected to nasal cannula oxygen Cardiovascular status: blood pressure returned to baseline and stable Postop Assessment: no apparent nausea or vomiting Anesthetic complications: no   No notable events documented.   Last Vitals:  Vitals:   07/10/24 0932 07/10/24 1105  BP: 125/87 120/80  Pulse:  79  Resp: 16 15  Temp: (!) 38 C 37.3 C  SpO2: 98% (!) 84%    Last Pain:  Vitals:   07/10/24 0932  TempSrc: Temporal  PainSc: 0-No pain                 Fairy A Rupal Childress      "

## 2024-07-10 NOTE — H&P (Signed)
 "  Michelle Schaffer, MD [facility] [address], [address] [place], [place] [address] Phone:[phone] Fax : [phone]  Primary Care Physician:  Ostwalt, Janna, PA-C Primary Gastroenterologist:  Dr. Schaffer  Pre-Procedure History & Physical: HPI:  Michelle Kelley is a 39 y.o. 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 H/o gastric sleeve. She takes a magnesium  gummy and will evaluate the subtype and stop taking it if it is Magnesium  citrate.    Past Medical History:  Diagnosis Date   Anxiety    Asthma    Complication of anesthesia    I have a hard time coming out of general anesthesia, if I come out of it too fast, I get violent, I have to be eased out of it.   COPD (chronic obstructive pulmonary disease) (HCC)    Depression    GERD (gastroesophageal reflux disease)    HSV-2 (herpes simplex virus 2) infection    Hypertension    no meds   Iron  deficiency anemia 05/19/2021   Manic depression (HCC)    Menorrhagia    Migraine    Postoperative anemia due to acute blood loss 11/29/2015   Due to blood loss due to cesarean delivery    PTSD (post-traumatic stress disorder)    Schizophrenia, schizo-affective (HCC)    Vaginal Pap smear, abnormal     Past Surgical History:  Procedure Laterality Date   ABDOMINAL SURGERY     CARPAL TUNNEL RELEASE     CESAREAN SECTION     CESAREAN SECTION N/A 11/26/2015   Procedure: CESAREAN SECTION;  Surgeon: Mitzie BROCKS Ward, MD;  Location: [facility] ORS;  Service: Obstetrics;  Laterality: N/A;   CESAREAN SECTION WITH BILATERAL TUBAL LIGATION Bilateral 06/09/2021   Procedure: CESAREAN SECTION WITH BILATERAL TUBAL LIGATION;  Surgeon: Lake Read, MD;  Location: [facility] OR[facility];  Service: Obstetrics;  Laterality: Bilateral;   cosmetic repairs     left arm, right knee and back.   LAPAROSCOPIC GASTRIC SLEEVE RESECTION     PANNICULECTOMY     TONSILLECTOMY      Prior to Admission  medications  Medication Sig Start Date End Date Taking? Authorizing Provider  amLODipine  (NORVASC ) 5 MG tablet TAKE 1 TABLET BY MOUTH ONCE DAILY. DO NOT TAKE THIS MEDICATION IF PREGNANT. 04/04/24  Yes Ostwalt, Janna, PA-C  ARIPiprazole  ER (ABILIFY  MAINTENA) 400 MG PRSY prefilled syringe Inject 400 mg into the muscle every 28 (twenty-eight) days. 05/07/24  Yes Saucier, Dorn Ruth, NP  atorvastatin  (LIPITOR) 10 MG tablet Take 1 tablet by mouth once daily 04/25/24  Yes Ostwalt, Janna, PA-C  Biotin 2500 MCG CHEW Chew 1 tablet by mouth daily.   Yes [provider]  cetirizine (ZYRTEC) 10 MG tablet Take 10 mg by mouth daily.   Yes [provider]  Collagen-Vitamin C -Biotin (COLLAGEN PO) Take 1 tablet by mouth daily.   Yes [provider]  desogestrel -ethinyl estradiol  (MIRCETTE ) 0.15-0.02/0.01 MG (21/5) tablet Take 1 tablet by mouth daily. 06/14/24  Yes Swanson, Eleanor M, CNM  IRON , FERROUS SULFATE , PO Take 12 mg by mouth daily.   Yes [provider]  lisinopril  (ZESTRIL ) 2.5 MG tablet TAKE 1 TABLET BY MOUTH ONCE DAILY. DO NOT TAKE THIS MEDICATION IF PREGNANT. 04/04/24  Yes Ostwalt, Janna, PA-C  Magnesium  Citrate (MAGNESIUM  GUMMIES PO) Take 1 tablet by mouth daily.   Yes [provider]  mometasone  (ELOCON ) 0.1 % cream Apply 1 Application topically 2 (two) times daily. 05/09/24  Yes   Multiple Vitamins-Minerals (CENTRUM DUAL ACT MULTI+ BEAUTY) CHEW Chew 1 tablet by mouth daily.   Yes [provider]  Olopatadine  HCl 0.2 % SOLN Place 1 drop into affected eye(s) daily. 05/09/24  Yes   ondansetron  (ZOFRAN -ODT) 4 MG disintegrating tablet Take 1 tablet (4 mg total) by mouth every 8 (eight) hours as needed for nausea or vomiting. 05/27/24  Yes Celestia Rima, NP  pantoprazole  (PROTONIX ) 40 MG tablet Take 1 tablet (40 mg total) by mouth daily. 05/27/24  Yes Celestia Rima, NP  Probiotic Product (PROBIOTIC ACIDOPHILUS BIOBEADS) CAPS Take 1 capsule by mouth  daily.   Yes [provider]  albuterol  (VENTOLIN  HFA) 108 (90 Base) MCG/ACT inhaler Inhale 2 puffs into the lungs every 6 (six) hours as needed for wheezing or shortness of breath. 02/16/24   Ostwalt, Janna, PA-C  Blood Pressure Monitoring (BLOOD PRESSURE KIT) DEVI Any brand available, measure BP daily 12/28/23   Ostwalt, Janna, PA-C  fluticasone  (FLONASE ) 50 MCG/ACT nasal spray Place 1-2 sprays into both nostrils daily. 05/09/24       Allergies as of 05/27/2024 - Review Complete 05/27/2024  Allergen Reaction Noted   Amitriptyline  12/14/2014   Nsaids  12/14/2014   Sulfa antibiotics  12/14/2014   Latex Rash 12/14/2014    Family History  Problem Relation Age of Onset   Cancer Mother    Hypertension Mother    Heart disease Mother    Diabetes Mother    Lung disease Mother    Clotting disorder Mother    Bipolar disorder Mother    Mental illness Sister        SCHIZOPHRENIA    Social History   Socioeconomic History   Marital status: Media Planner    Spouse name: Not on file   Number of children: 2   Years of education: Not on file   Highest education level: Some college, no degree  Occupational History   Not on file  Tobacco Use   Smoking status: Former    Current packs/day: 0.00    Types: Cigarettes, E-cigarettes    Quit date: 05/30/2023    Years since quitting: 1.1   Smokeless tobacco: Never  Vaping Use   Vaping status: Every Day   Substances: Nicotine , Flavoring  Substance and Sexual Activity   Alcohol use: Never   Drug use: Yes    Types: Marijuana    Comment: quit 1 month ago   Sexual activity: Yes    Partners: Male, Female    Birth control/protection: Pill  Other Topics Concern   Not on file  Social History Narrative   Not on file   Social Drivers of Health   Tobacco Use: Medium Risk (07/10/2024)   Patient History    Smoking Tobacco Use: Former    Smokeless Tobacco Use: Never    Passive Exposure: Not on Actuary Strain: Low  Risk (02/16/2024)   Overall Financial Resource Strain (CARDIA)    Difficulty of Paying Living Expenses: Not very hard  Food Insecurity: No Food Insecurity (02/16/2024)   Epic    Worried About Radiation Protection Practitioner of Food in the Last Year: Never true    Ran Out of Food in the Last Year: Never true  Transportation Needs: No Transportation Needs (02/16/2024)   Epic    Lack of Transportation (Medical): No    Lack of Transportation (Non-Medical): No  Physical Activity: Sufficiently Active (02/16/2024)   Exercise Vital Sign    Days of Exercise per Week: 5  days    Minutes of Exercise per Session: 60 min  Stress: Stress Concern Present (06/27/2024)   [employer] of Occupational Health - Occupational Stress Questionnaire    Feeling of Stress: To some extent  Social Connections: Socially Isolated (06/27/2024)   Social Connection and Isolation Panel    Frequency of Communication with Friends and Family: Twice a week    Frequency of Social Gatherings with Friends and Family: Never    Attends Religious Services: More than 4 times per year    Active Member of Clubs or Organizations: No    Attends Banker Meetings: Never    Marital Status: Divorced  Catering Manager Violence: Not At Risk (02/16/2024)   Epic    Fear of Current or Ex-Partner: No    Emotionally Abused: No    Physically Abused: No    Sexually Abused: No  Depression (PHQ2-9): Medium Risk (06/27/2024)   Depression (PHQ2-9)    PHQ-2 Score: 7  Alcohol Screen: Low Risk (02/16/2024)   Alcohol Screen    Last Alcohol Screening Score (AUDIT): 0  Housing: Low Risk (02/16/2024)   Epic    Unable to Pay for Housing in the Last Year: No    Number of Times Moved in the Last Year: 0    Homeless in the Last Year: No  Utilities: Not At Risk (02/16/2024)   Epic    Threatened with loss of utilities: No  Health Literacy: Adequate Health Literacy (02/16/2024)   B1300 Health Literacy    Frequency of need for help with medical instructions: Never     Review of Systems: See HPI, otherwise negative ROS  Physical Exam: BP 125/87   Temp (!) 100.4 F (38 C) (Temporal)   Resp 16   Ht 5' 2.01 (1.575 m)   Wt 83.4 kg   LMP 05/20/2024 (Exact Date)   SpO2 98%   BMI 33.63 kg/m  CONSTITUTIONAL: Well-appearing in no acute distress.  HEENT: Pupils equal, round, Extraocular movements intact. Conjunctivae clear NECK: Neck supple CARDIOVASCULAR: Regular rate, no LE edema  RESPIRATORY: No labored breathing  ABDOMEN: Abdomen soft, nontender, not distended, no guarding, no rigidity SKIN: No apparent skin rashes or lesions. NEUROLOGIC: Normal speech, no focal findings. Mental status alert and oriented x4. PSYCHIATRIC: Mood and affect normal.   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 H/o gastric sleeve. She takes a magnesium  gummy and will evaluate the subtype and stop taking it if it is Magnesium  citrate.  Risks, benefits, limitations, and alternatives regarding  endoscopy have been reviewed with the patient.  Questions have been answered.  All parties agreeable.   Michelle Michelle HERO, MD  07/10/2024, 10:05 AM  "

## 2024-07-10 NOTE — Transfer of Care (Signed)
 Immediate Anesthesia Transfer of Care Note  Patient: Deasha K Cederberg  Procedure(s) Performed: EGD (ESOPHAGOGASTRODUODENOSCOPY) WITH BIOPSY (Mouth)  Patient Location: PACU  Anesthesia Type: General  Level of Consciousness: awake, alert  and patient cooperative  Airway and Oxygen Therapy: Patient Spontanous Breathing and Patient connected to supplemental oxygen  Post-op Assessment: Post-op Vital signs reviewed, Patient's Cardiovascular Status Stable, Respiratory Function Stable, Patent Airway and No signs of Nausea or vomiting  Post-op Vital Signs: Reviewed and stable  Complications: No notable events documented.

## 2024-07-10 NOTE — Anesthesia Preprocedure Evaluation (Signed)
"                                    Anesthesia Evaluation  Patient identified by MRN, date of birth, ID band Patient awake    Reviewed: Allergy & Precautions, H&P , NPO status , Patient's Chart, lab work & pertinent test results  Airway Mallampati: II  TM Distance: >3 FB Neck ROM: Full    Dental no notable dental hx. (+) Teeth Intact   Pulmonary neg pulmonary ROS, former smoker   Pulmonary exam normal breath sounds clear to auscultation       Cardiovascular hypertension, negative cardio ROS Normal cardiovascular exam Rhythm:Regular Rate:Normal     Neuro/Psych negative neurological ROS  negative psych ROS   GI/Hepatic negative GI ROS, Neg liver ROS,,,  Endo/Other  negative endocrine ROS    Renal/GU negative Renal ROS  negative genitourinary   Musculoskeletal negative musculoskeletal ROS (+)    Abdominal   Peds negative pediatric ROS (+)  Hematology negative hematology ROS (+)   Anesthesia Other Findings   Reproductive/Obstetrics negative OB ROS                              Anesthesia Physical Anesthesia Plan  ASA: 3  Anesthesia Plan: General   Post-op Pain Management:    Induction: Intravenous  PONV Risk Score and Plan:   Airway Management Planned:   Additional Equipment:   Intra-op Plan:   Post-operative Plan: Extubation in OR  Informed Consent: I have reviewed the patients History and Physical, chart, labs and discussed the procedure including the risks, benefits and alternatives for the proposed anesthesia with the patient or authorized representative who has indicated his/her understanding and acceptance.     Dental advisory given  Plan Discussed with: CRNA  Anesthesia Plan Comments:         Anesthesia Quick Evaluation  "

## 2024-07-10 NOTE — Op Note (Signed)
 [facility] Patient Name: Michelle Kelley Procedure Date: 07/10/2024 10:33 AM MRN: [id number] Account #: [id number] Date of Birth: 12/16/1985 Admit Type: Outpatient Age: 39 Room: [facility] OR ROOM 01 Gender: Female Note Status: Finalized Instrument Name: Endoscope [id number] Procedure:             Upper GI endoscopy Indications:           Diagnostic sampling is indicated, Upper abdominal                         symptoms that persist despite an appropriate trial of                         therapy Providers:             Clotilda Schaffer, MD Referring MD:          Jolynn Spencer (Referring MD) Medicines:             Propofol  per Anesthesia Complications:         No immediate complications. Procedure:             Pre-Anesthesia Assessment:                        - Prior to the procedure, a History and Physical was                         performed, and patient medications and allergies were                         reviewed. The patient's tolerance of previous                         anesthesia was also reviewed. The risks and benefits                         of the procedure and the sedation options and risks                         were discussed with the patient. All questions were                         answered, and informed consent was obtained. Prior                         Anticoagulants: The patient has taken no anticoagulant                         or antiplatelet agents. ASA Grade Assessment: II - A                         patient with mild systemic disease. After reviewing                         the risks and benefits, the patient was deemed in                         satisfactory condition to undergo the procedure.  After obtaining informed consent, the endoscope was                         passed under direct vision. Throughout the procedure,                         the patient's blood pressure, pulse, and oxygen                          saturations were monitored continuously. The Endoscope                         was introduced through the mouth, and advanced to the                         third part of duodenum. The upper GI endoscopy was                         accomplished without difficulty. The patient tolerated                         the procedure well. Findings:      LA Grade B (one or more mucosal breaks greater than 5 mm, not extending       between the tops of two mucosal folds) esophagitis with no bleeding was       found at the gastroesophageal junction. Biopsies were obtained from the       proximal and distal esophagus with cold forceps for histology of       suspected eosinophilic esophagitis.      A 3 cm hiatal hernia was present.      Evidence of a sleeve gastrectomy was found in the gastric body. This was       characterized by healthy appearing mucosa. Biopsies were taken with a       cold forceps for histology. Estimated blood loss: none.      The examined duodenum was normal. Biopsies for histology were taken with       a cold forceps for evaluation of celiac disease. Impression:            - LA Grade B reflux esophagitis with no bleeding.                        - 3 cm hiatal hernia.                        - A sleeve gastrectomy was found, characterized by                         healthy appearing mucosa. Biopsied.                        - Normal examined duodenum. Biopsied.                        - Biopsies were taken with a cold forceps for                         evaluation of eosinophilic esophagitis. Recommendation:        - Patient has a  contact number available for                         emergencies. The signs and symptoms of potential                         delayed complications were discussed with the patient.                         Return to normal activities tomorrow. Written                         discharge instructions were provided to the patient.                         - GERD prevention diet.                        - Continue present medications but discontinue any                         magnesium  supplements.                        - Await pathology results.                        If negative, consider increasing protonix  to twice                         daily dosing. Gastric sleeve and hiatal hernia are                         each independient risk factors for nausea.                        - The findings and recommendations were discussed with                         the designated responsible adult. Procedure Code(s):     --- Professional ---                        [phone], Esophagogastroduodenoscopy, flexible,                         transoral; with biopsy, single or multiple Diagnosis Code(s):     --- Professional ---                        K21.00, Gastro-esophageal reflux disease with                         esophagitis, without bleeding                        K44.9, Diaphragmatic hernia without obstruction or                         gangrene                        Z98.84, Bariatric surgery status  R19.8, Other specified symptoms and signs involving                         the digestive system and abdomen CPT copyright 2022 American Medical Association. All rights reserved. The codes documented in this report are preliminary and upon coder review may  be revised to meet current compliance requirements. Clotilda Schaffer, MD 07/10/2024 11:03:32 AM Number of Addenda: 0 Note Initiated On: 07/10/2024 10:33 AM Total Procedure Duration: 0 hours 5 minutes 35 seconds  Estimated Blood Loss:  Estimated blood loss: none.      [facility]

## 2024-07-11 ENCOUNTER — Ambulatory Visit: Payer: MEDICAID

## 2024-07-15 ENCOUNTER — Ambulatory Visit: Admitting: Family Medicine

## 2024-07-15 ENCOUNTER — Other Ambulatory Visit: Admitting: Urology

## 2024-07-16 ENCOUNTER — Ambulatory Visit: Payer: MEDICAID

## 2024-07-16 ENCOUNTER — Ambulatory Visit: Payer: Self-pay | Admitting: Gastroenterology

## 2024-07-16 DIAGNOSIS — F251 Schizoaffective disorder, depressive type: Secondary | ICD-10-CM | POA: Diagnosis not present

## 2024-07-16 LAB — SURGICAL PATHOLOGY
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
## 2024-07-20 ENCOUNTER — Other Ambulatory Visit: Payer: Self-pay | Admitting: Physician Assistant

## 2024-07-20 DIAGNOSIS — E7849 Other hyperlipidemia: Secondary | ICD-10-CM

## 2024-07-22 DIAGNOSIS — J300 Vasomotor rhinitis: Secondary | ICD-10-CM | POA: Insufficient documentation

## 2024-07-22 DIAGNOSIS — H1045 Other chronic allergic conjunctivitis: Secondary | ICD-10-CM | POA: Insufficient documentation

## 2024-07-23 ENCOUNTER — Encounter: Payer: Self-pay | Admitting: Family Medicine

## 2024-07-23 ENCOUNTER — Other Ambulatory Visit: Payer: Self-pay

## 2024-07-23 ENCOUNTER — Ambulatory Visit: Payer: MEDICAID | Admitting: Family Medicine

## 2024-07-23 VITALS — BP 124/80 | HR 81 | Temp 98.9°F | Ht 62.0 in | Wt 191.8 lb

## 2024-07-23 DIAGNOSIS — R195 Other fecal abnormalities: Secondary | ICD-10-CM | POA: Diagnosis not present

## 2024-07-23 DIAGNOSIS — R112 Nausea with vomiting, unspecified: Secondary | ICD-10-CM

## 2024-07-23 MED ORDER — FAMOTIDINE 20 MG PO TABS
20.0000 mg | ORAL_TABLET | Freq: Every day | ORAL | 2 refills | Status: AC
  Filled 2024-07-23: qty 30, 30d supply, fill #0

## 2024-07-23 NOTE — Patient Instructions (Signed)
 Please start taking fiber supplement also.

## 2024-07-31 ENCOUNTER — Ambulatory Visit: Payer: MEDICAID

## 2024-08-06 ENCOUNTER — Ambulatory Visit: Payer: MEDICAID

## 2024-08-13 ENCOUNTER — Ambulatory Visit: Payer: MEDICAID

## 2024-09-23 ENCOUNTER — Other Ambulatory Visit: Admitting: Urology

## 2024-10-28 ENCOUNTER — Ambulatory Visit: Payer: MEDICAID | Admitting: Family Medicine
